# Patient Record
Sex: Female | Born: 1950 | Race: White | Hispanic: No | Marital: Married | Smoking: Never smoker
Health system: Southern US, Community
[De-identification: ages and names within clinical notes are randomized; demographics above are authoritative.]

## PROBLEM LIST (undated history)

## (undated) DIAGNOSIS — M1990 Unspecified osteoarthritis, unspecified site: Secondary | ICD-10-CM

## (undated) DIAGNOSIS — D1803 Hemangioma of intra-abdominal structures: Secondary | ICD-10-CM

## (undated) DIAGNOSIS — T7840XA Allergy, unspecified, initial encounter: Secondary | ICD-10-CM

## (undated) DIAGNOSIS — E785 Hyperlipidemia, unspecified: Secondary | ICD-10-CM

## (undated) DIAGNOSIS — Z79890 Hormone replacement therapy (postmenopausal): Secondary | ICD-10-CM

## (undated) DIAGNOSIS — K219 Gastro-esophageal reflux disease without esophagitis: Secondary | ICD-10-CM

## (undated) HISTORY — DX: Unspecified osteoarthritis, unspecified site: M19.90

## (undated) HISTORY — DX: Hyperlipidemia, unspecified: E78.5

## (undated) HISTORY — DX: Hemangioma of intra-abdominal structures: D18.03

## (undated) HISTORY — DX: Allergy, unspecified, initial encounter: T78.40XA

## (undated) HISTORY — DX: Gastro-esophageal reflux disease without esophagitis: K21.9

## (undated) HISTORY — PX: JOINT REPLACEMENT: SHX530

## (undated) HISTORY — PX: EYE SURGERY: SHX253

## (undated) HISTORY — PX: TUBAL LIGATION: SHX77

## (undated) HISTORY — DX: Hormone replacement therapy: Z79.890

---
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
## 2008-04-03 ENCOUNTER — Ambulatory Visit: Payer: Self-pay | Admitting: *Deleted

## 2008-04-03 ENCOUNTER — Ambulatory Visit (HOSPITAL_BASED_OUTPATIENT_CLINIC_OR_DEPARTMENT_OTHER): Admission: RE | Admit: 2008-04-03 | Discharge: 2008-04-03 | Payer: Self-pay | Admitting: *Deleted

## 2008-04-03 DIAGNOSIS — H9190 Unspecified hearing loss, unspecified ear: Secondary | ICD-10-CM | POA: Insufficient documentation

## 2008-04-03 DIAGNOSIS — E785 Hyperlipidemia, unspecified: Secondary | ICD-10-CM | POA: Insufficient documentation

## 2008-04-03 DIAGNOSIS — E7800 Pure hypercholesterolemia, unspecified: Secondary | ICD-10-CM | POA: Insufficient documentation

## 2008-04-03 DIAGNOSIS — R42 Dizziness and giddiness: Secondary | ICD-10-CM | POA: Insufficient documentation

## 2008-04-08 ENCOUNTER — Encounter (INDEPENDENT_AMBULATORY_CARE_PROVIDER_SITE_OTHER): Payer: Self-pay | Admitting: *Deleted

## 2008-04-11 ENCOUNTER — Encounter (INDEPENDENT_AMBULATORY_CARE_PROVIDER_SITE_OTHER): Payer: Self-pay | Admitting: *Deleted

## 2008-04-15 ENCOUNTER — Telehealth (INDEPENDENT_AMBULATORY_CARE_PROVIDER_SITE_OTHER): Payer: Self-pay | Admitting: *Deleted

## 2008-06-30 ENCOUNTER — Ambulatory Visit: Payer: Self-pay | Admitting: *Deleted

## 2008-06-30 DIAGNOSIS — M1990 Unspecified osteoarthritis, unspecified site: Secondary | ICD-10-CM | POA: Insufficient documentation

## 2008-07-04 ENCOUNTER — Encounter (INDEPENDENT_AMBULATORY_CARE_PROVIDER_SITE_OTHER): Payer: Self-pay | Admitting: *Deleted

## 2008-07-09 ENCOUNTER — Encounter (INDEPENDENT_AMBULATORY_CARE_PROVIDER_SITE_OTHER): Payer: Self-pay | Admitting: *Deleted

## 2008-07-09 ENCOUNTER — Telehealth (INDEPENDENT_AMBULATORY_CARE_PROVIDER_SITE_OTHER): Payer: Self-pay | Admitting: *Deleted

## 2008-12-30 ENCOUNTER — Ambulatory Visit: Payer: Self-pay | Admitting: Family Medicine

## 2008-12-30 LAB — CONVERTED CEMR LAB: HDL goal, serum: 40 mg/dL

## 2009-04-27 ENCOUNTER — Ambulatory Visit: Payer: Self-pay | Admitting: Radiology

## 2009-04-27 ENCOUNTER — Ambulatory Visit (HOSPITAL_BASED_OUTPATIENT_CLINIC_OR_DEPARTMENT_OTHER): Admission: RE | Admit: 2009-04-27 | Discharge: 2009-04-27 | Payer: Self-pay | Admitting: Internal Medicine

## 2009-05-04 ENCOUNTER — Ambulatory Visit: Payer: Self-pay | Admitting: Family

## 2009-05-04 LAB — CONVERTED CEMR LAB
Albumin: 4.7 g/dL (ref 3.5–5.2)
BUN: 12 mg/dL (ref 6–23)
CO2: 22 meq/L (ref 19–32)
Chloride: 105 meq/L (ref 96–112)
Eosinophils Relative: 4 % (ref 0–5)
HCT: 42 % (ref 36.0–46.0)
Hemoglobin: 13.4 g/dL (ref 12.0–15.0)
Indirect Bilirubin: 0.4 mg/dL (ref 0.0–0.9)
LDL Cholesterol: 63 mg/dL (ref 0–99)
Leukocytes, UA: NEGATIVE
Lymphocytes Relative: 26 % (ref 12–46)
Lymphs Abs: 1.3 10*3/uL (ref 0.7–4.0)
Monocytes Absolute: 0.3 10*3/uL (ref 0.1–1.0)
Monocytes Relative: 7 % (ref 3–12)
Platelets: 343 10*3/uL (ref 150–400)
Potassium: 4.1 meq/L (ref 3.5–5.3)
Protein, ur: NEGATIVE mg/dL
RBC: 4.47 M/uL (ref 3.87–5.11)
Total Protein: 6.8 g/dL (ref 6.0–8.3)
Triglycerides: 115 mg/dL (ref <150)
Urobilinogen, UA: 0.2 (ref 0.0–1.0)
VLDL: 23 mg/dL (ref 0–40)
WBC: 4.9 10*3/uL (ref 4.0–10.5)

## 2009-05-05 ENCOUNTER — Other Ambulatory Visit: Admission: RE | Admit: 2009-05-05 | Discharge: 2009-05-05 | Payer: Self-pay | Admitting: Internal Medicine

## 2009-05-05 ENCOUNTER — Ambulatory Visit: Payer: Self-pay | Admitting: Family

## 2009-05-05 DIAGNOSIS — N952 Postmenopausal atrophic vaginitis: Secondary | ICD-10-CM | POA: Insufficient documentation

## 2009-05-14 LAB — HM PAP SMEAR

## 2009-05-15 ENCOUNTER — Encounter: Payer: Self-pay | Admitting: Family

## 2009-08-05 ENCOUNTER — Ambulatory Visit: Payer: Self-pay | Admitting: Family

## 2009-08-06 ENCOUNTER — Telehealth (INDEPENDENT_AMBULATORY_CARE_PROVIDER_SITE_OTHER): Payer: Self-pay | Admitting: *Deleted

## 2009-08-06 ENCOUNTER — Ambulatory Visit: Payer: Self-pay | Admitting: Family

## 2009-08-06 LAB — CONVERTED CEMR LAB: Cholesterol: 163 mg/dL (ref 0–200)

## 2009-09-23 ENCOUNTER — Telehealth: Payer: Self-pay | Admitting: Family

## 2009-11-10 ENCOUNTER — Ambulatory Visit: Payer: Self-pay | Admitting: Family

## 2009-11-10 LAB — CONVERTED CEMR LAB
HDL: 41 mg/dL (ref >39)
LDL Cholesterol: 147 mg/dL — ABNORMAL HIGH (ref 0–99)
Triglycerides: 130 mg/dL (ref <150)
VLDL: 26 mg/dL (ref 0–40)

## 2009-11-11 ENCOUNTER — Encounter: Payer: Self-pay | Admitting: Family

## 2010-03-24 ENCOUNTER — Telehealth: Payer: Self-pay | Admitting: Family

## 2010-05-20 ENCOUNTER — Ambulatory Visit: Payer: Self-pay | Admitting: Diagnostic Radiology

## 2010-05-20 ENCOUNTER — Ambulatory Visit (HOSPITAL_BASED_OUTPATIENT_CLINIC_OR_DEPARTMENT_OTHER): Admission: RE | Admit: 2010-05-20 | Discharge: 2010-05-20 | Payer: Self-pay | Admitting: Internal Medicine

## 2010-05-20 LAB — HM MAMMOGRAPHY: HM Mammogram: NORMAL

## 2010-08-24 NOTE — Assessment & Plan Note (Signed)
Summary: 3 MONTH FOLLOW UP/MHF   Vital Signs:  Patient profile:   60 year old female Height:      63 inches Weight:      136.25 pounds BMI:     24.22 Pulse rate:   70 / minute BP sitting:   110 / 80  Vitals Entered By: Kandice Hams (August 05, 2009 11:35 AM) CC: 3  MONTH FOLLOWUP   Primary Care Provider:  Seymour Bars DO  CC:  3  MONTH FOLLOWUP.  History of Present Illness: Ms Lorge presents today in follow up.  Last visit we decreased her statin dose- with plans to repeat her lipids in 3 months.  Patient hopes to come off of her statin.  She has lost 5 pounds since our last visit- she has been following weight watchers.  She also notes that she has been using estrogen cream intermittently for vaginal dryness/ dypareunia.  She notes improvement when she uses this cream.    Patient also notes bilateral wrist pain at times.  Occasionally she develops tingling in the fingers of her left hand.    Allergies: No Known Drug Allergies  Family History: Family History of Arthritis Mother had breast cancer- age 33 Family History Diabetes 1st degree relative Family History Bladder cancer  Review of Systems       bilateral wrist pain, occasional numbness in the left   Physical Exam  General:  Well-developed,well-nourished,in no acute distress; alert,appropriate and cooperative throughout examination Neck:  No deformities, masses, or tenderness noted. Lungs:  Normal respiratory effort, chest expands symmetrically. Lungs are clear to auscultation, no crackles or wheezes. Heart:  Normal rate and regular rhythm. S1 and S2 normal without gallop, murmur, click, rub or other extra sounds. Msk:  negative phalens test   Impression & Recommendations:  Problem # 1:  HYPERCHOLESTEROLEMIA (ICD-272.0) Assessment Comment Only Will check FLP, and LFT's.  Patient dose not have any clear risk factors for heart disease.  If her cholesterol looks good, will consider trial off of statin.  Patient  has lost almost 15 pounds since she established care in 2009. I commended her efforts.   Her updated medication list for this problem includes:    Simvastatin 10 Mg Tabs (Simvastatin) .Marland Kitchen... 1 by mouth at bedtime  Problem # 2:  VAGINITIS, ATROPHIC, POSTMENOPAUSAL (ICD-627.3) Assessment: Improved Continue estrace as needed.   Her updated medication list for this problem includes:    Estrace 0.1 Mg/gm Crea (Estradiol) .Marland Kitchen... Apply twice weekly per vagina  Patient Instructions: 1)  Please return to the lab fasting for a fasting lipid panel and LFT's (ICD-9 272.0).  This may be done at [facility]. 2)  Please schedule a follow-up appointment in 6 months.

## 2010-08-24 NOTE — Letter (Signed)
   [place] [facility] [address] [place], [place]  [address]  [place] Phone: [phone]      November 11, 2009   [facility] [address] [place], [place] [address]  RE:  LAB RESULTS  Dear  Ms. Fontanella,  The following is an interpretation of your most recent lab tests.  Please take note of any instructions provided or changes to medications that have resulted from your lab work.  Health professionals look at cholesterol as more involved than just the total cholesterol. We consider the level of LDL (bad) cholesterol, HDL (good), cholesterol, and Triglycerides (Grease) in the blood.  1. Your LDL should be under 100, and the HDL should be over 45, if you have any vascular disease such as heart attack, angina, stroke, TIA (mini stroke), claudication (pain in the legs when you walk due to poor circulation),  Abdominal Aortic Aneurysm (AAA), diabetes or prediabetes.  2. Your LDL should be under 130 if you have any two of the following:     a. Smoke or chew tobacco,     b. High blood pressure (if you are on medication or over 140/90 without medication),     c. Female gender,    d. HDL below 40,    e. A female relative (father, brother, or son), who have had any vascular event          as described in #1. above under the age of 104, or a female relative (mother,       sister, or daughter) who had an event as described above under age 104. (An HDL over 60 will subtract one risk factor from the total, so if you have two items in # 2 above, but an HDL over 60, you then fall into category # 3 below).  3. Your LDL should be under 160 if you have any one of the above.  Triglycerides should be under 200 with the ideal being under 150.  For diabetes or pre-diabetes, the ideal HgbA1C should be under 6.0%.  If you fall into any of the above categories, you should make a follow up appointment to discuss this with your physician.  LIPID PANEL:  Stable - no changes needed Triglyceride:  130   Cholesterol: 214   LDL: 147   HDL: 41   Chol/HDL%:  5.2 Ratio   Your cholesterol has crept back up a bit.  It is reasonable at this point to keep a close eye on your cholesterol and continue to work hard on diet and exercise.  Lets plan to repeat in October.   Have a nice Spring and Summer!   Sincerely Yours,    Lemont Fillers FNP

## 2010-08-24 NOTE — Progress Notes (Signed)
  Phone Note Outgoing Call   Call placed by: Lemont Fillers FNP,  March 24, 2010 11:52 AM Call placed to: Patient Summary of Call: Patient's husband came to be seen today and asked for rx for wife for zostavax.  I called pt and confirmed that she wishes to receive injection- she gave me permission to give rx to her husband. Initial call taken by: Lemont Fillers FNP,  March 24, 2010 11:53 AM    New/Updated Medications: ZOSTAVAX 04540 UNT/0.65ML SOLR (ZOSTER VACCINE LIVE) call office to arrange appointment to inject vaccine Prescriptions: ZOSTAVAX 98119 UNT/0.65ML SOLR (ZOSTER VACCINE LIVE) call office to arrange appointment to inject vaccine  #1 x 0   Entered and Authorized by:   Lemont Fillers FNP   Signed by:   Lemont Fillers FNP on 03/24/2010   Method used:   Print then Give to Patient   RxID:   [id number]

## 2010-08-24 NOTE — Assessment & Plan Note (Signed)
Summary: FASTING 3 MONTH ROA AND LABS/CDJ   Vital Signs:  Patient profile:   60 year old female Height:      63 inches Weight:      132 pounds BMI:     23.47 Temp:     98.0 degrees F oral Pulse rate:   84 / minute Pulse rhythm:   regular Resp:     12 per minute BP sitting:   100 / 78  (right arm) Cuff size:   regular  Vitals Entered By: Mervin Kung CMA (November 10, 2009 8:02 AM) CC: room 4  3 month fasting follow up Is Patient Diabetic? No   Primary Care Provider:  Seymour Bars DO  CC:  room 4  3 month fasting follow up.  History of Present Illness: 60 year old female who presents today for follow up of her hypercholesterolemia.   She notes that she has lost an additional 4 pounds on weight watchers and has achieved her goal weight.   She has been off of her statin since her last visit and presents fasting today.  Atrophic Vaginitis- notes that her symptoms are stable, she is rarely using the estrace.    Patient notes that she has had a great deal of stress due to her husband's recent job loss and a death in the family.    Allergies (verified): No Known Drug Allergies  Physical Exam  General:  Well-developed,well-nourished,in no acute distress; alert,appropriate and cooperative throughout examination Head:  Normocephalic and atraumatic without obvious abnormalities. No apparent alopecia or balding. Lungs:  Normal respiratory effort, chest expands symmetrically. Lungs are clear to auscultation, no crackles or wheezes. Heart:  Normal rate and regular rhythm. S1 and S2 normal without gallop, murmur, click, rub or other extra sounds.   Impression & Recommendations:  Problem # 1:  HYPERCHOLESTEROLEMIA (ICD-272.0) Assessment Comment Only Check FLP today.  Patient has lost 16 pounds on weight watchers and is hoping that her lipids will remain stable off of a statin.   Orders: T-Lipid Profile ([id number])  Problem # 2:  VAGINITIS, ATROPHIC, POSTMENOPAUSAL  (ICD-627.3) Assessment: Improved Patient notes that her symptoms are stable, she is rarely using the estrace.   Her updated medication list for this problem includes:    Estrace 0.1 Mg/gm Crea (Estradiol) .Marland Kitchen... Apply twice weekly per vagina  Complete Medication List: 1)  Complete Tabs (Multiple vitamins-minerals) .... Take 1 tablet by mouth once a day 2)  Fish Oil Maximum Strength 1200 Mg Caps (Omega-3 fatty acids) .... Take 1 tablet by mouth once a day 3)  Bayer Aspirin Ec Low Dose 81 Mg Tbec (Aspirin) .... Take 1 tablet by mouth once a day 4)  Calcium 500/vitamin D 500-125 Mg-unit Tabs (Calcium carbonate-vitamin d) .... Take 1 tablet by mouth once a day 5)  Glucosamine Chondr 1500 Complx Caps (Glucosamine-chondroit-vit c-mn) .... Take 1 tablet by mouth once a day 6)  Estrace 0.1 Mg/gm Crea (Estradiol) .... Apply twice weekly per vagina  Patient Instructions: 1)  Good job on your weight loss! 2)  Have a great spring. 3)  Please follow up in 1 year, sooner if problems or concerns.  Current Allergies (reviewed today): No known allergies

## 2010-08-24 NOTE — Progress Notes (Signed)
----   Converted from flag ---- ---- 09/23/2009 12:30 PM, Michaelle Copas wrote: Patient has 2 appointments scheduled to see you & she needs to know which one she should actually keep and f/u with you.Her appt. dates are 4/49/11 & 01/05/10..... Which one should she cancel?? I will call patient back as soon as you let me know which one to cancel. Thank you, Michaelle Copas   ------------------------------  Patient needs to return in April for fasting lipid panel.  I do not need to see her back for a visit unless her cholesterol is abnormal.  She should follow for annual physical, sooner if problems or concerns. thanks

## 2010-08-24 NOTE — Progress Notes (Signed)
Summary: discuss labs  Phone Note Outgoing Call   Summary of Call: Please call patient and let her know that her cholesterol looks good.  She may discontinue her simvastatin, lets have her repeat a fasting lipid profile in 3 months please.   Initial call taken by: Lemont Fillers FNP,  August 06, 2009 10:21 PM  Follow-up for Phone Call        left message on machine .......Marland KitchenDoristine Devoid  August 07, 2009 4:27 PM   spoke w/ patient aware of instructions and f/u scheduled for 3 months...........Marland KitchenDoristine Devoid  August 10, 2009 3:07 PM

## 2010-08-31 ENCOUNTER — Encounter: Payer: Self-pay | Admitting: Family

## 2010-09-01 ENCOUNTER — Encounter: Payer: Self-pay | Admitting: Family

## 2010-09-09 NOTE — Miscellaneous (Signed)
Summary: Zoster Vaccine  Clinical Lists Changes  Medications: Removed medication of ZOSTAVAX 91478 UNT/0.65ML SOLR (ZOSTER VACCINE LIVE) call office to arrange appointment to inject vaccine Observations: Added new observation of ZOSTAVAX: Zostavax (08/31/2010 9:04)      Immunization History:  Zostavax History:    Zostavax # 1:  zostavax (08/31/2010)

## 2010-09-15 NOTE — Miscellaneous (Signed)
Summary: Shingles Vaccine  Shingles Vaccine   Imported By: Maryln Gottron 09/09/2010 15:31:55  _____________________________________________________________________  External Attachment:    Type:   Image     Comment:   External Document

## 2010-10-12 ENCOUNTER — Telehealth: Payer: Self-pay | Admitting: *Deleted

## 2010-10-12 DIAGNOSIS — Z0000 Encounter for general adult medical examination without abnormal findings: Secondary | ICD-10-CM

## 2010-10-12 NOTE — Telephone Encounter (Signed)
Melissa, please advise

## 2010-10-12 NOTE — Telephone Encounter (Signed)
Please send BMET, LFT, FLP, TSH, CBC (v70).

## 2010-10-13 NOTE — Telephone Encounter (Signed)
Orders entered and faxed to the lab.  

## 2010-10-18 ENCOUNTER — Encounter: Payer: Self-pay | Admitting: Family

## 2010-10-18 ENCOUNTER — Other Ambulatory Visit: Payer: Self-pay | Admitting: Family

## 2010-10-18 DIAGNOSIS — Z0000 Encounter for general adult medical examination without abnormal findings: Secondary | ICD-10-CM

## 2010-10-18 DIAGNOSIS — E7800 Pure hypercholesterolemia, unspecified: Secondary | ICD-10-CM

## 2010-10-18 LAB — HEPATIC FUNCTION PANEL
Bilirubin, Direct: 0.1 mg/dL (ref 0.0–0.3)
Indirect Bilirubin: 0.4 mg/dL (ref 0.0–0.9)
Total Protein: 7.2 g/dL (ref 6.0–8.3)

## 2010-10-18 LAB — CBC WITH DIFFERENTIAL/PLATELET
Basophils Relative: 1 % (ref 0–1)
Eosinophils Absolute: 0.2 10*3/uL (ref 0.0–0.7)
Eosinophils Relative: 3 % (ref 0–5)
Hemoglobin: 13.5 g/dL (ref 12.0–15.0)
MCH: 30.4 pg (ref 26.0–34.0)
MCHC: 32.4 g/dL (ref 30.0–36.0)
MCV: 93.9 fL (ref 78.0–100.0)
Monocytes Relative: 6 % (ref 3–12)
Neutrophils Relative %: 66 % (ref 43–77)

## 2010-10-18 LAB — BASIC METABOLIC PANEL
Calcium: 9.5 mg/dL (ref 8.4–10.5)
Creat: 0.7 mg/dL (ref 0.40–1.20)

## 2010-10-18 LAB — LIPID PANEL
HDL: 44 mg/dL (ref >39)
LDL Cholesterol: 138 mg/dL — ABNORMAL HIGH (ref 0–99)
Triglycerides: 188 mg/dL — ABNORMAL HIGH (ref <150)
VLDL: 38 mg/dL (ref 0–40)

## 2010-10-18 NOTE — Progress Notes (Signed)
This encounter was created in error - please disregard.

## 2010-10-22 ENCOUNTER — Encounter: Payer: Self-pay | Admitting: Internal Medicine

## 2010-10-25 ENCOUNTER — Ambulatory Visit (INDEPENDENT_AMBULATORY_CARE_PROVIDER_SITE_OTHER): Payer: BC Managed Care – PPO | Admitting: Family

## 2010-10-25 ENCOUNTER — Encounter: Payer: Self-pay | Admitting: Family

## 2010-10-25 DIAGNOSIS — N952 Postmenopausal atrophic vaginitis: Secondary | ICD-10-CM

## 2010-10-25 DIAGNOSIS — G5600 Carpal tunnel syndrome, unspecified upper limb: Secondary | ICD-10-CM

## 2010-10-25 DIAGNOSIS — M545 Low back pain, unspecified: Secondary | ICD-10-CM

## 2010-10-25 DIAGNOSIS — G5601 Carpal tunnel syndrome, right upper limb: Secondary | ICD-10-CM

## 2010-10-25 DIAGNOSIS — Z0000 Encounter for general adult medical examination without abnormal findings: Secondary | ICD-10-CM

## 2010-10-25 MED ORDER — ESTRADIOL 0.1 MG/GM VA CREA: TOPICAL_CREAM | VAGINAL | Status: DC

## 2010-10-25 NOTE — Assessment & Plan Note (Signed)
Labs reviewed, including hyperlipidemia/hypertriglyceridemia.  Discussed importance of low cholesterol diet and avoiding high fructose corn syrup, concentrated sweets- substitute whole grained pasta/rice/bread for white versions.  Immunizations reviewed and up to date.  Due for colonoscopy, dexa.  Will order.

## 2010-10-25 NOTE — Assessment & Plan Note (Signed)
Pt's intermittent pain/right leg "giving out" sounds like it could be related to her lumbar spine.  She wishes to defer further imaging at this time and have a trial of NSAIDS.  No weakness or numbness in the leg at this time.  Monitor.

## 2010-10-25 NOTE — Assessment & Plan Note (Signed)
Recommended that she purchase a wrist brace for the right hand to be worn at night and hort term use of Motrin or Aleve.  Recommended that she work on good posture during her crafting such as crocheting to help alleviate neck stress/pain.  If no improvement with these measures, I recommended that she let us know and we can consider further imaging.

## 2010-10-25 NOTE — Patient Instructions (Addendum)
Keep up the good work with the exercise. Please schedule a mammogram on the first floor today. You may use aleve as needed for neck and back pain.  Let me know if your symptoms worsen or if they do not improve in the next 1 month. You will be contacted about your referral to Gastroenterology for your colonoscopy. Please schedule your bone density at the front desk.

## 2010-10-25 NOTE — Assessment & Plan Note (Signed)
Patient is requesting to resume vaginal Estrace.  Will resume at low dose.

## 2010-10-25 NOTE — Progress Notes (Signed)
  Subjective:    Patient ID: Jocelyn Morgan, female    DOB: 01/01/1951, 60 y.o.   MRN: [id number]  HPI  Ms.  Morgan is a 60 year old female who presents today for her annual physical.  She also has some additional concerns.  1) Preventative- Last Pap was 10/10 and was up to date.  Last colonoscopy was in 2001. Reports that her last bone density test was about 7 yrs ago.  She is exercising regularly.  Eating healthy- she has managed to keep the weight off that she lost last year with weight watchers. Had Zostavax given and Tetanus is up to date.     2) Neck pain- top of neck with sleep Some numbness in the righ hand.    3) R groin pain- occasionally right leg- gives up on me.  Some tenderness in lower back.  Stability loss.  Worse when walking down stair.  Denies masses or abdominal bulging.  4) Had abdominal pain 3-4 weeks ago with associated low grade fever.  Resolved.      Review of Systems  Constitutional: Negative for fatigue.  HENT: Positive for neck stiffness. Negative for hearing loss.   Eyes: Negative for visual disturbance.  Respiratory: Negative for cough and shortness of breath.   Cardiovascular: Negative for chest pain and leg swelling.  Gastrointestinal: Negative for abdominal pain and abdominal distention.  Genitourinary: Negative for dysuria and frequency.  Musculoskeletal: Positive for back pain. Negative for joint swelling.  Skin: Positive for rash.  Neurological: Negative for weakness and headaches.  Hematological: Negative for adenopathy.  Psychiatric/Behavioral:       Denies depression/anxiety       Objective:   Physical Exam  Constitutional: She is oriented to person, place, and time. She appears well-developed and well-nourished.  HENT:  Head: Normocephalic and atraumatic.  Eyes: Conjunctivae are normal. Pupils are equal, round, and reactive to light.  Neck: Neck supple. No thyromegaly present.  Cardiovascular: Normal rate, regular rhythm, S1 normal and  S2 normal.   Pulmonary/Chest: Effort normal and breath sounds normal.  Abdominal: Soft. Bowel sounds are normal. She exhibits no mass. There is no tenderness.  Genitourinary: No breast swelling, tenderness or discharge.       Deferred- pap is up to date.  Musculoskeletal: Normal range of motion. She exhibits no edema and no tenderness.       Right shoulder: She exhibits no swelling.  Lymphadenopathy:    She has no cervical adenopathy.    She has no axillary adenopathy.  Neurological: She is alert and oriented to person, place, and time. She exhibits normal muscle tone.       Bilateral upper extremity strength is 5/5, bilateral lower extremity strength is 5/5  Neg Phalans test, neg tinnels   Skin: Skin is warm and dry. No rash noted.  Psychiatric: She has a normal mood and affect. Her speech is normal and behavior is normal.          Assessment & Plan:

## 2010-11-04 ENCOUNTER — Ambulatory Visit (INDEPENDENT_AMBULATORY_CARE_PROVIDER_SITE_OTHER)
Admission: RE | Admit: 2010-11-04 | Discharge: 2010-11-04 | Disposition: A | Discharge status: Home / Self Care | Payer: BC Managed Care – PPO | Source: Ambulatory Visit

## 2010-11-04 DIAGNOSIS — N959 Unspecified menopausal and perimenopausal disorder: Secondary | ICD-10-CM

## 2010-11-04 DIAGNOSIS — Z13820 Encounter for screening for osteoporosis: Secondary | ICD-10-CM

## 2010-11-17 ENCOUNTER — Telehealth: Payer: Self-pay | Admitting: Family

## 2010-11-17 DIAGNOSIS — M8580 Other specified disorders of bone density and structure, unspecified site: Secondary | ICD-10-CM | POA: Insufficient documentation

## 2010-11-17 DIAGNOSIS — M810 Age-related osteoporosis without current pathological fracture: Secondary | ICD-10-CM | POA: Insufficient documentation

## 2010-11-17 NOTE — Telephone Encounter (Signed)
Please call patient and let her know that I have reviewed her bone density test.  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  She should continue her calcium +D supplement and exercise.  I would also like to have her complete a vitamin D level please (diag osteopenia).  Will will plan to repeat her bone density in 2 yrs.

## 2010-11-18 NOTE — Telephone Encounter (Signed)
Left message on machine to return my call. 

## 2010-11-19 NOTE — Telephone Encounter (Signed)
Pt notified and requests copies of bone density and most recent labs. Info mailed to pt. Lab order has been entered and faxed to the lab. Pt was provided with lab hours.

## 2010-11-19 NOTE — Telephone Encounter (Signed)
Bone density not scanned into Epic yet. Will have to call Med records to see if they can scan it.

## 2010-11-22 NOTE — Telephone Encounter (Signed)
Spoke to Trish at [facility]. She does not have bone density report yet. She will check in today's envelope and let me know if it came through. Advised pt report will have to be scanned before I can mail it to her.

## 2010-11-24 ENCOUNTER — Encounter: Payer: Self-pay | Admitting: Family

## 2010-11-25 NOTE — Telephone Encounter (Signed)
Spoke to pt and advised her that we have received bone density copy and it has been mailed to her along with her lab results. Pt asked Vit D result and was notified that it was in the normal range. Please advise if pt should continue current medication regimen or if she needs Rx strength Vit d.

## 2010-11-25 NOTE — Telephone Encounter (Signed)
Spoke with Trish @ [facility] @ [facility] and she states she still has not received pt's bone density result for scanning. Spoke to Larae at [facility] [phone] and she will look for report and call me back.

## 2010-12-08 ENCOUNTER — Encounter: Payer: Self-pay | Admitting: Family

## 2010-12-08 LAB — HM COLONOSCOPY: HM COLON: NORMAL

## 2011-03-14 ENCOUNTER — Emergency Department (HOSPITAL_BASED_OUTPATIENT_CLINIC_OR_DEPARTMENT_OTHER)
Admission: EM | Admit: 2011-03-14 | Discharge: 2011-03-14 | Disposition: A | Discharge status: Home / Self Care | Payer: BC Managed Care – PPO | Attending: Emergency Medicine | Admitting: Emergency Medicine

## 2011-03-14 ENCOUNTER — Encounter (HOSPITAL_BASED_OUTPATIENT_CLINIC_OR_DEPARTMENT_OTHER): Payer: Self-pay | Admitting: *Deleted

## 2011-03-14 ENCOUNTER — Emergency Department (INDEPENDENT_AMBULATORY_CARE_PROVIDER_SITE_OTHER): Payer: BC Managed Care – PPO

## 2011-03-14 DIAGNOSIS — K5732 Diverticulitis of large intestine without perforation or abscess without bleeding: Secondary | ICD-10-CM

## 2011-03-14 DIAGNOSIS — K625 Hemorrhage of anus and rectum: Secondary | ICD-10-CM | POA: Insufficient documentation

## 2011-03-14 DIAGNOSIS — Z8739 Personal history of other diseases of the musculoskeletal system and connective tissue: Secondary | ICD-10-CM | POA: Insufficient documentation

## 2011-03-14 DIAGNOSIS — Z79899 Other long term (current) drug therapy: Secondary | ICD-10-CM | POA: Insufficient documentation

## 2011-03-14 DIAGNOSIS — E785 Hyperlipidemia, unspecified: Secondary | ICD-10-CM | POA: Insufficient documentation

## 2011-03-14 DIAGNOSIS — M4120 Other idiopathic scoliosis, site unspecified: Secondary | ICD-10-CM

## 2011-03-14 DIAGNOSIS — K769 Liver disease, unspecified: Secondary | ICD-10-CM

## 2011-03-14 DIAGNOSIS — R1032 Left lower quadrant pain: Secondary | ICD-10-CM | POA: Insufficient documentation

## 2011-03-14 DIAGNOSIS — K7689 Other specified diseases of liver: Secondary | ICD-10-CM

## 2011-03-14 LAB — CBC
HCT: 39.9 % (ref 36.0–46.0)
Hemoglobin: 13.6 g/dL (ref 12.0–15.0)
MCV: 90.9 fL (ref 78.0–100.0)
RBC: 4.39 MIL/uL (ref 3.87–5.11)
RDW: 12.3 % (ref 11.5–15.5)
WBC: 14.6 10*3/uL — ABNORMAL HIGH (ref 4.0–10.5)

## 2011-03-14 LAB — URINALYSIS, ROUTINE W REFLEX MICROSCOPIC
Glucose, UA: NEGATIVE mg/dL
Leukocytes, UA: NEGATIVE
Protein, ur: NEGATIVE mg/dL

## 2011-03-14 LAB — COMPREHENSIVE METABOLIC PANEL
Albumin: 4.7 g/dL (ref 3.5–5.2)
BUN: 13 mg/dL (ref 6–23)
Calcium: 10.4 mg/dL (ref 8.4–10.5)
Chloride: 100 mEq/L (ref 96–112)
Creatinine, Ser: 0.7 mg/dL (ref 0.50–1.10)
Total Bilirubin: 0.5 mg/dL (ref 0.3–1.2)
Total Protein: 7.9 g/dL (ref 6.0–8.3)

## 2011-03-14 LAB — URINE MICROSCOPIC-ADD ON

## 2011-03-14 LAB — DIFFERENTIAL
Basophils Relative: 0 % (ref 0–1)
Eosinophils Absolute: 0.1 10*3/uL (ref 0.0–0.7)
Eosinophils Relative: 1 % (ref 0–5)
Monocytes Absolute: 0.9 10*3/uL (ref 0.1–1.0)
Monocytes Relative: 6 % (ref 3–12)

## 2011-03-14 MED ORDER — CIPROFLOXACIN HCL 500 MG PO TABS
500.0000 mg | ORAL_TABLET | Freq: Once | ORAL | Status: AC
  Administered 2011-03-14: 500 mg via ORAL
  Filled 2011-03-14: qty 1

## 2011-03-14 MED ORDER — METRONIDAZOLE 500 MG PO TABS
500.0000 mg | ORAL_TABLET | Freq: Once | ORAL | Status: AC
  Administered 2011-03-14: 500 mg via ORAL
  Filled 2011-03-14: qty 1

## 2011-03-14 MED ORDER — IOHEXOL 300 MG/ML  SOLN
100.0000 mL | Freq: Once | INTRAMUSCULAR | Status: AC | PRN
  Administered 2011-03-14: 100 mL via INTRAVENOUS

## 2011-03-14 MED ORDER — HYDROCODONE-ACETAMINOPHEN 5-500 MG PO TABS: 1.0000 | ORAL_TABLET | Freq: Four times a day (QID) | ORAL | Status: DC | PRN

## 2011-03-14 MED ORDER — HYDROCODONE-ACETAMINOPHEN 5-325 MG PO TABS
1.0000 | ORAL_TABLET | Freq: Once | ORAL | Status: AC
  Administered 2011-03-14: 1 via ORAL
  Filled 2011-03-14: qty 1

## 2011-03-14 MED ORDER — METRONIDAZOLE 500 MG PO TABS: 500.0000 mg | ORAL_TABLET | Freq: Three times a day (TID) | ORAL | Status: AC

## 2011-03-14 MED ORDER — CIPROFLOXACIN HCL 500 MG PO TABS: 500.0000 mg | ORAL_TABLET | Freq: Two times a day (BID) | ORAL | Status: AC

## 2011-03-14 NOTE — ED Notes (Signed)
Pt states that she has been having lower abd pain since Friday. C/O low-grade temp that started this AM and a few episodes of blood in her stools. Denies weakness or vaginal bleeding.

## 2011-03-14 NOTE — ED Notes (Signed)
Pt to CT, IV site unremarkable.

## 2011-03-14 NOTE — ED Provider Notes (Signed)
History    Scribed for Geoffery Lyons, MD, the patient was seen in room MH07/MH07. This chart was scribed by Katha Cabal. This patient's care was started at 8:24PM.   CSN: [id number] Arrival date & time: 03/14/2011  7:56 PM  Chief Complaint  Patient presents with  . Abdominal Pain  . Rectal Bleeding   HPI Vallorie Niccoli is a 60 y.o. female who presents to the Emergency Department complaining of intermittent lower abdominal pain onset 3 days ago with associated rectal bleeding onset 1 day ago, diarrhea, and fever.   Denies vomiting, C section, and cholecystectomy.  Pt notes that her recent (May/June) colonoscopy revealed diverticulosis and having a few small polps removed.     HPI ELEMENTS:  Location: lower abdominal  Onset: 3 days ago  Duration: persistent since onset   Timing: intermittent  Severity: moderate  Context: as above  Associated symptoms: rectal bleeding onset 1 day ago, diarrhea, and fever.  Denies vomiting, C section, and cholecystectomy.    PAST MEDICAL HISTORY:  Past Medical History  Diagnosis Date  . Hyperlipidemia   . Arthritis     osteoarthritis  . Postmenopausal HRT (hormone replacement therapy)     PAST SURGICAL HISTORY:  Past Surgical History  Procedure Date  . Tubal ligation   . Tubal ligation     MEDICATIONS:  Previous Medications   ASPIRIN 81 MG EC TABLET    Take 81 mg by mouth daily.     CALCIUM-VITAMIN D 500-125 MG-UNIT TABS    Take 1 tablet by mouth daily.     ESTRADIOL (ESTRACE) 0.1 MG/GM VAGINAL CREAM    One gram of cream inserted into vagina twice weekly.   IBUPROFEN (ADVIL,MOTRIN) 200 MG TABLET    Take 400 mg by mouth every 6 (six) hours as needed. pain    MISC NATURAL PRODUCTS (OSTEO BI-FLEX ADV TRIPLE ST) TABS    Take 1 tablet by mouth daily.     MULTIPLE VITAMIN (MULTIVITAMIN) TABLET    Take 1 tablet by mouth daily.     OMEGA-3 FATTY ACIDS (FISH OIL) 1200 MG CAPS    Take 1 capsule by mouth daily.       ALLERGIES:  Allergies as of  03/14/2011  . (No Known Allergies)     FAMILY HISTORY:  Family History  Problem Relation Age of Onset  . Cancer Mother 35    breast  . Arthritis Other   . Diabetes Other   . Cancer Other     bladder     SOCIAL HISTORY: History   Social History  . Marital Status: Married    Spouse Name: N/A    Number of Children: 4  . Years of Education: N/A   Occupational History  . housewife    Social History Main Topics  . Smoking status: Never Smoker   . Smokeless tobacco: Never Used  . Alcohol Use: 0.6 oz/week    1 Glasses of wine per week  . Drug Use: No  . Sexually Active: Yes   Other Topics Concern  . None   Social History Narrative   Exercise: sometimesCaffeine: 2 cups 1/2 and 1/2 coffee daily    Review of Systems 10 Systems reviewed and are negative for acute change except as noted in the HPI.  Physical Exam  BP 120/68  Pulse 91  Temp(Src) 99.6 F (37.6 C) (Oral)  Resp 16  SpO2 97%  LMP 07/25/1998  Physical Exam  Nursing note and vitals reviewed. Constitutional: She is oriented  to person, place, and time. She appears well-developed and well-nourished.  HENT:  Head: Normocephalic and atraumatic.  Neck: Normal range of motion. Neck supple.  Cardiovascular: Normal rate, regular rhythm and normal heart sounds.  Exam reveals no gallop and no friction rub.   No murmur heard. Pulmonary/Chest: Effort normal and breath sounds normal. No respiratory distress.       Lungs clear bilaterally  Abdominal: Soft. Bowel sounds are normal. There is tenderness (suprapubic tenderness). There is no rebound, no guarding and no CVA tenderness.  Musculoskeletal: Normal range of motion.  Neurological: She is alert and oriented to person, place, and time.  Skin: Skin is warm and dry.  Psychiatric: She has a normal mood and affect. Her behavior is normal.   I personally performed the services described in this documentation, which was scribed in my presence. The recorded information  has been reviewed and considered. No att. providers found   ED Course  Procedures OTHER DATA REVIEWED: Nursing notes, vital signs, and past medical records reviewed.   DIAGNOSTIC STUDIES: Oxygen Saturation is 96% on room air, normal by my interpretation.      LABS / RADIOLOGY:  Results for orders placed during the hospital encounter of 03/14/11  CBC      Component Value Range   WBC 14.6 (*) 4.0 - 10.5 (K/uL)   RBC 4.39  3.87 - 5.11 (MIL/uL)   Hemoglobin 13.6  12.0 - 15.0 (g/dL)   HCT 40.9  81.1 - 91.4 (%)   MCV 90.9  78.0 - 100.0 (fL)   MCH 31.0  26.0 - 34.0 (pg)   MCHC 34.1  30.0 - 36.0 (g/dL)   RDW 78.2  95.6 - 21.3 (%)   Platelets 305  150 - 400 (K/uL)  DIFFERENTIAL      Component Value Range   Neutrophils Relative 85 (*) 43 - 77 (%)   Neutro Abs 12.4 (*) 1.7 - 7.7 (K/uL)   Lymphocytes Relative 9 (*) 12 - 46 (%)   Lymphs Abs 1.3  0.7 - 4.0 (K/uL)   Monocytes Relative 6  3 - 12 (%)   Monocytes Absolute 0.9  0.1 - 1.0 (K/uL)   Eosinophils Relative 1  0 - 5 (%)   Eosinophils Absolute 0.1  0.0 - 0.7 (K/uL)   Basophils Relative 0  0 - 1 (%)   Basophils Absolute 0.0  0.0 - 0.1 (K/uL)  COMPREHENSIVE METABOLIC PANEL      Component Value Range   Sodium 138  135 - 145 (mEq/L)   Potassium 3.6  3.5 - 5.1 (mEq/L)   Chloride 100  96 - 112 (mEq/L)   CO2 25  19 - 32 (mEq/L)   Glucose, Bld 122 (*) 70 - 99 (mg/dL)   BUN 13  6 - 23 (mg/dL)   Creatinine, Ser 0.86  0.50 - 1.10 (mg/dL)   Calcium 57.8  8.4 - 10.5 (mg/dL)   Total Protein 7.9  6.0 - 8.3 (g/dL)   Albumin 4.7  3.5 - 5.2 (g/dL)   AST 19  0 - 37 (U/L)   ALT 14  0 - 35 (U/L)   Alkaline Phosphatase 61  39 - 117 (U/L)   Total Bilirubin 0.5  0.3 - 1.2 (mg/dL)   GFR calc non Af Amer >60  >60 (mL/min)   GFR calc Af Amer >60  >60 (mL/min)  URINALYSIS, ROUTINE W REFLEX MICROSCOPIC      Component Value Range   Color, Urine YELLOW  YELLOW    Appearance  CLEAR  CLEAR    Specific Gravity, Urine 1.006  1.005 - 1.030    pH 6.0   5.0 - 8.0    Glucose, UA NEGATIVE  NEGATIVE (mg/dL)   Hgb urine dipstick SMALL (*) NEGATIVE    Bilirubin Urine NEGATIVE  NEGATIVE    Ketones, ur NEGATIVE  NEGATIVE (mg/dL)   Protein, ur NEGATIVE  NEGATIVE (mg/dL)   Urobilinogen, UA 0.2  0.0 - 1.0 (mg/dL)   Nitrite NEGATIVE  NEGATIVE    Leukocytes, UA NEGATIVE  NEGATIVE   URINE MICROSCOPIC-ADD ON      Component Value Range   Squamous Epithelial / LPF RARE  RARE    RBC / HPF 3-6  <3 (RBC/hpf)   Bacteria, UA RARE  RARE    No results found.  Ct Abdomen Pelvis W Contrast  03/14/2011  *RADIOLOGY REPORT*  Clinical Data: Lower abdominal pain and rectal bleeding; low grade fever.  CT ABDOMEN AND PELVIS WITH CONTRAST  Technique:  Multidetector CT imaging of the abdomen and pelvis was performed following the standard protocol during bolus administration of intravenous contrast.  Contrast: 100 mL of Omnipaque 300 IV contrast  Comparison: None.  Findings: Minimal scarring or atelectasis is noted at the lung bases.  There is a heterogeneous mass measuring 4.2 x 3.2 x 3.1 cm within the right hepatic dome, with irregular margins; malignancy cannot be excluded.  Dynamic liver protocol MRI is recommended for further evaluation, when and as deemed clinically appropriate.  Additional scattered hypodensities throughout both hepatic lobes appear to reflect cysts.  The spleen is unremarkable in appearance.  The gallbladder is within normal limits.  The pancreas and adrenal glands are unremarkable.  There is unusual dilatation of the right ureter along its entire course, asymmetric to the left side.  This is nonspecific but raises question for distal obstruction; no definite mass is characterized.  No significant hydronephrosis is seen.  The left kidney is unremarkable in appearance.  No renal or ureteral stones are identified.  The small bowel is unremarkable in appearance.  The stomach is within normal limits.  No acute vascular abnormalities are seen.  The appendix is  normal in caliber and contains air, without evidence for appendicitis; lungs directly adjacent to the cecum. There is significant focal wall thickening along the proximal sigmoid colon, with several inflamed diverticula and surrounding soft tissue stranding, compatible with acute diverticulitis. Additional underlying mild diverticulosis is noted at the splenic flexure of the colon.  No free fluid is seen within the pelvis.  The bladder is mildly distended and grossly unremarkable in appearance.  The uterus is displaced to the right by the inflamed sigmoid colon, but otherwise appears unremarkable.  Soft tissue stranding about the sigmoid colon abuts the left ovary; the ovaries remain relatively symmetric.  No suspicious adnexal masses are seen.  A small left inguinal hernia is incidentally noted.  No inguinal lymphadenopathy is seen.  No acute osseous abnormalities are identified.  Mild left convex lumbar scoliosis is noted.  IMPRESSION:  1.  Acute diverticulitis at the proximal sigmoid colon, with significant focal colonic wall thickening and several inflamed diverticula.  Surrounding soft tissue stranding, without free fluid.  No evidence for perforation or abscess formation. Underlying diverticulosis involves the sigmoid colon and splenic flexure of the colon. 2.  Heterogeneous 4.2 cm mass within the right hepatic lobe, with irregular margins; malignancy cannot be excluded.  Dynamic liver protocol MRI is recommended for further evaluation, when and as deemed clinically appropriate.  3.  Scattered hepatic cysts noted. 4.  Dilatation of the right ureter along its entire course; this is nonspecific, though distal obstruction cannot be excluded.  No definite mass or distal stone seen; no evidence of hydronephrosis. 5.  Mild left convex lumbar scoliosis noted.  Original Report Authenticated By: Tonia Ghent, M.D.     ED COURSE / COORDINATION OF CARE: Had CT, labs consistent with diverticulitis.  Orders Placed  This Encounter  Procedures  . CT Abdomen Pelvis W Contrast  . CBC  . Differential  . Comprehensive metabolic panel  . Urinalysis with microscopic  . Urine microscopic-add on    MDM: The CT scan showed an incidental liver mass.  Radiology recommends a follow up mri to better delineate what this represents.  I discussed this at length with the patient and her husband.  They have assured me that they will follow up with her doctor to discuss this this week.     IMPRESSION: Diagnoses that have been ruled out:  Diagnoses that are still under consideration:  Final diagnoses:  Abdominal pain, left lower quadrant  Diverticulitis of colon (without mention of hemorrhage)    PLAN:  Home Nonsteroidals and Narcotic pain medication and antibiotics. The patient is to return the emergency department if there is any worsening of symptoms. I have reviewed the discharge instructions with the patient and husband.   CONDITION ON DISCHARGE: Stable   MEDICATIONS GIVEN IN THE E.D.  Medications  ibuprofen (ADVIL,MOTRIN) 200 MG tablet (not administered)  ciprofloxacin (CIPRO) 500 MG tablet (not administered)  metroNIDAZOLE (FLAGYL) 500 MG tablet (not administered)  HYDROcodone-acetaminophen (VICODIN) 5-500 MG per tablet (not administered)  iohexol (OMNIPAQUE) 300 MG/ML injection 100 mL (100 mL Intravenous Contrast Given 03/14/11 2133)     DISCHARGE MEDICATIONS: New Prescriptions   CIPROFLOXACIN (CIPRO) 500 MG TABLET    Take 1 tablet (500 mg total) by mouth 2 (two) times daily.   HYDROCODONE-ACETAMINOPHEN (VICODIN) 5-500 MG PER TABLET    Take 1-2 tablets by mouth every 6 (six) hours as needed for pain.   METRONIDAZOLE (FLAGYL) 500 MG TABLET    Take 1 tablet (500 mg total) by mouth 3 (three) times daily.       Geoffery Lyons, MD 03/14/11 [phone]

## 2011-03-14 NOTE — ED Notes (Signed)
Pt returned from CT, NAD noted.

## 2011-03-14 NOTE — ED Notes (Signed)
Pt finished CT contrast, CT made aware.

## 2011-03-14 NOTE — ED Notes (Signed)
Pt c/o generalized abd pain x 3 days, rectal bleeding x 1 day

## 2011-03-15 ENCOUNTER — Telehealth: Payer: Self-pay | Admitting: Family

## 2011-03-15 ENCOUNTER — Ambulatory Visit (INDEPENDENT_AMBULATORY_CARE_PROVIDER_SITE_OTHER): Payer: BC Managed Care – PPO | Admitting: Family

## 2011-03-15 ENCOUNTER — Encounter: Payer: Self-pay | Admitting: Family

## 2011-03-15 VITALS — BP 115/79 | HR 60 | Temp 98.7°F | Resp 16 | Ht 62.0 in | Wt 134.1 lb

## 2011-03-15 DIAGNOSIS — R160 Hepatomegaly, not elsewhere classified: Secondary | ICD-10-CM

## 2011-03-15 DIAGNOSIS — K769 Liver disease, unspecified: Secondary | ICD-10-CM

## 2011-03-15 DIAGNOSIS — K5792 Diverticulitis of intestine, part unspecified, without perforation or abscess without bleeding: Secondary | ICD-10-CM | POA: Insufficient documentation

## 2011-03-15 DIAGNOSIS — K5732 Diverticulitis of large intestine without perforation or abscess without bleeding: Secondary | ICD-10-CM

## 2011-03-15 LAB — CBC WITH DIFFERENTIAL/PLATELET
Basophils Absolute: 0 10*3/uL (ref 0.0–0.1)
Basophils Relative: 0 % (ref 0–1)
Eosinophils Absolute: 0 10*3/uL (ref 0.0–0.7)
Eosinophils Relative: 0 % (ref 0–5)
MCH: 31 pg (ref 26.0–34.0)
MCV: 92.1 fL (ref 78.0–100.0)
Platelets: 334 10*3/uL (ref 150–400)
RDW: 12.8 % (ref 11.5–15.5)
WBC: 13.4 10*3/uL — ABNORMAL HIGH (ref 4.0–10.5)

## 2011-03-15 NOTE — Assessment & Plan Note (Addendum)
She is clinically responding to antibiotics.  Notes only blood tinged mucous which I assured her may continue for the next few days.  I did caution her to contact us or go to the ED if she develops worsening abdominal pain or increasing amounts of blood in the stool.  Will check CBC today. Pt instructed to continue cipro/flagyl.

## 2011-03-15 NOTE — Patient Instructions (Signed)
Continue your antibiotics. Call if you develop worsening blood in stool, worsening abdominal pain or fever over 101.   Follow up in 1 week.

## 2011-03-15 NOTE — Progress Notes (Signed)
Subjective:    Patient ID: Jocelyn Morgan, female    DOB: [birth year], 60 y.o.   MRN: [id number]  HPI  [name] is a 60 yr old female who presents today for ED follow up.  She was seen last night in the ED for abdominal pain.  Symptoms started approximately 3 days ago. A CT abdomen and pelvis was performed in the ED which noted Diverticulitis.  She was rx'd Cipro/Flagyl.  Incidental finding of a Heterogeneous 4.2 cm mass within the right hepatic lobe, with irregular margins.    She has been drinking and eating.  Denies associated fever.  She reports that yesterday her lower abdomen was tender.  Today improved and localized mostly to the left lower abdomen. Notes associated streaks of blood mucous today.   She reports yeterday stools were loose but now more solid.  Denies previous history of diverticultis, but was told on her recent colonoscopy that she has diverticulosis.  Review of Systems See HPI  Past Medical History  Diagnosis Date  . Hyperlipidemia   . Arthritis     osteoarthritis  . Postmenopausal HRT (hormone replacement therapy)     History   Social History  . Marital Status: Married    Spouse Name: N/A    Number of Children: 4  . Years of Education: N/A   Occupational History  . housewife    Social History Main Topics  . Smoking status: Never Smoker   . Smokeless tobacco: Never Used  . Alcohol Use: 0.6 oz/week    1 Glasses of wine per week  . Drug Use: No  . Sexually Active: Yes   Other Topics Concern  . Not on file   Social History Narrative   Exercise: sometimesCaffeine: 2 cups 1/2 and 1/2 coffee daily    Past Surgical History  Procedure Date  . Tubal ligation   . Tubal ligation     Family History  Problem Relation Age of Onset  . Cancer Mother 47    breast  . Arthritis Other   . Diabetes Other   . Cancer Other     bladder    No Known Allergies  Current Outpatient Prescriptions on File Prior to Visit  Medication Sig Dispense Refill  .  aspirin 81 MG EC tablet Take 81 mg by mouth daily.        . Calcium-Vitamin D 500-125 MG-UNIT TABS Take 1 tablet by mouth daily.        . ciprofloxacin (CIPRO) 500 MG tablet Take 1 tablet (500 mg total) by mouth 2 (two) times daily.  20 tablet  0  . estradiol (ESTRACE) 0.1 MG/GM vaginal cream One gram of cream inserted into vagina twice weekly.  42.5 g  11  . HYDROcodone-acetaminophen (VICODIN) 5-500 MG per tablet Take 1-2 tablets by mouth every 6 (six) hours as needed for pain.  20 tablet  0  . ibuprofen (ADVIL,MOTRIN) 200 MG tablet Take 400 mg by mouth every 6 (six) hours as needed. pain       . metroNIDAZOLE (FLAGYL) 500 MG tablet Take 1 tablet (500 mg total) by mouth 3 (three) times daily.  30 tablet  0  . Misc Natural Products (OSTEO BI-FLEX ADV TRIPLE ST) TABS Take 1 tablet by mouth daily.        . Multiple Vitamin (MULTIVITAMIN) tablet Take 1 tablet by mouth daily.        . Omega-3 Fatty Acids (FISH OIL) 1200 MG CAPS Take 1 capsule by mouth daily.  Current Facility-Administered Medications on File Prior to Visit  Medication Dose Route Frequency Provider Last Rate Last Dose  . ciprofloxacin (CIPRO) tablet 500 mg  500 mg Oral Once Geoffery Lyons, MD   500 mg at 03/14/11 2239  . HYDROcodone-acetaminophen (NORCO) 5-325 MG per tablet 1 tablet  1 tablet Oral Once Geoffery Lyons, MD   1 tablet at 03/14/11 2240  . iohexol (OMNIPAQUE) 300 MG/ML injection 100 mL  100 mL Intravenous Once PRN Medication Radiologist   100 mL at 03/14/11 2133  . metroNIDAZOLE (FLAGYL) tablet 500 mg  500 mg Oral Once Geoffery Lyons, MD   500 mg at 03/14/11 2239    BP 115/79  Pulse 60  Temp(Src) 98.7 F (37.1 C) (Oral)  Resp 16  Ht 5\' 2"  (1.575 m)  Wt 134 lb 1.3 oz (60.818 kg)  BMI 24.52 kg/m2  LMP 07/25/1998       Objective:   Physical Exam  Constitutional: She appears well-developed and well-nourished.  Cardiovascular: Normal rate and regular rhythm.   Pulmonary/Chest: Effort normal and breath sounds  normal. No respiratory distress. She has no wheezes. She has no rales. She exhibits no tenderness.  Abdominal: Soft. Bowel sounds are normal. She exhibits no distension and no mass. There is tenderness. There is no rebound and no guarding.       Mild left lower quadrant tenderness  Skin: Skin is warm and dry.  Psychiatric: She has a normal mood and affect. Her speech is normal and behavior is normal. Judgment and thought content normal. Cognition and memory are normal.          Assessment & Plan:

## 2011-03-15 NOTE — Assessment & Plan Note (Signed)
Incidental note was made on CT of liver mass.  Will order MRI to further evaluate.

## 2011-03-15 NOTE — Telephone Encounter (Signed)
Please call pt to arrange an ED follow up.

## 2011-03-15 NOTE — Telephone Encounter (Signed)
Pt was seen at 11:15 this morning.

## 2011-03-16 ENCOUNTER — Telehealth: Payer: Self-pay | Admitting: Family

## 2011-03-16 ENCOUNTER — Encounter: Payer: Self-pay | Admitting: Family

## 2011-03-16 ENCOUNTER — Ambulatory Visit (HOSPITAL_BASED_OUTPATIENT_CLINIC_OR_DEPARTMENT_OTHER)
Admission: RE | Admit: 2011-03-16 | Discharge: 2011-03-16 | Disposition: A | Discharge status: Home / Self Care | Payer: BC Managed Care – PPO | Source: Ambulatory Visit | Attending: Family | Admitting: Family

## 2011-03-16 DIAGNOSIS — K7689 Other specified diseases of liver: Secondary | ICD-10-CM

## 2011-03-16 DIAGNOSIS — R160 Hepatomegaly, not elsewhere classified: Secondary | ICD-10-CM

## 2011-03-16 DIAGNOSIS — D1803 Hemangioma of intra-abdominal structures: Secondary | ICD-10-CM

## 2011-03-16 DIAGNOSIS — D1809 Hemangioma of other sites: Secondary | ICD-10-CM | POA: Insufficient documentation

## 2011-03-16 HISTORY — DX: Hemangioma of intra-abdominal structures: D18.03

## 2011-03-16 MED ORDER — GADOBENATE DIMEGLUMINE 529 MG/ML IV SOLN: 12.0000 mL | Freq: Once | INTRAVENOUS | Status: AC | PRN

## 2011-03-16 NOTE — Telephone Encounter (Signed)
Reviewed MRI results- benign hemangioma.  Reviewed lab results and MRI results with patient.

## 2011-03-17 ENCOUNTER — Telehealth: Payer: Self-pay | Admitting: *Deleted

## 2011-03-17 NOTE — Telephone Encounter (Signed)
Copy mailed to patient.

## 2011-03-17 NOTE — Telephone Encounter (Signed)
Message copied by Kathi Simpers on Thu Mar 17, 2011  9:02 AM ------      Message from: O'SULLIVAN, MELISSA      Created: Wed Mar 16, 2011  3:25 PM       Pt requested a copy of todays mri be mailed to her pls. Thanks

## 2011-03-22 ENCOUNTER — Ambulatory Visit: Payer: BC Managed Care – PPO | Admitting: Family

## 2011-03-23 ENCOUNTER — Encounter: Payer: Self-pay | Admitting: Family

## 2011-03-23 ENCOUNTER — Ambulatory Visit (INDEPENDENT_AMBULATORY_CARE_PROVIDER_SITE_OTHER): Payer: BC Managed Care – PPO | Admitting: Family

## 2011-03-23 DIAGNOSIS — K5792 Diverticulitis of intestine, part unspecified, without perforation or abscess without bleeding: Secondary | ICD-10-CM

## 2011-03-23 DIAGNOSIS — K5732 Diverticulitis of large intestine without perforation or abscess without bleeding: Secondary | ICD-10-CM

## 2011-03-23 DIAGNOSIS — D1803 Hemangioma of intra-abdominal structures: Secondary | ICD-10-CM

## 2011-03-23 NOTE — Assessment & Plan Note (Signed)
Improving.  Pt is instructed to complete antibiotics and to contact us if diarrhea does not resolve over the next few days.

## 2011-03-23 NOTE — Progress Notes (Signed)
Subjective:    Patient ID: Jocelyn Morgan, female    DOB: 1950-10-28, 60 y.o.   MRN: [id number]  HPI Jocelyn Morgan is a 60 yr old female who presents today for follow up of her Diverticulitis.  Diverticulitis- Pt has completed 8 of 10 days of antibiotics. Reports that she is having 2 loose stools a day which she attributes to a side effect of the antibiotics.  Reports improvement in the left lower quadrant pain. No bright red blood per rectum.    Liver hemangioma- Pt underwent MRI which confirmed that the liver mass seen on CT was actually a hemangioma.      Review of Systems See HPI  Past Medical History  Diagnosis Date  . Hyperlipidemia   . Arthritis     osteoarthritis  . Postmenopausal HRT (hormone replacement therapy)   . Liver hemangioma 03/16/2011    History   Social History  . Marital Status: Married    Spouse Name: N/A    Number of Children: 4  . Years of Education: N/A   Occupational History  . housewife    Social History Main Topics  . Smoking status: Never Smoker   . Smokeless tobacco: Never Used  . Alcohol Use: 0.6 oz/week    1 Glasses of wine per week  . Drug Use: No  . Sexually Active: Yes   Other Topics Concern  . Not on file   Social History Narrative   Exercise: sometimesCaffeine: 2 cups 1/2 and 1/2 coffee daily    Past Surgical History  Procedure Date  . Tubal ligation   . Tubal ligation     Family History  Problem Relation Age of Onset  . Cancer Mother 66    breast  . Arthritis Other   . Diabetes Other   . Cancer Other     bladder    No Known Allergies  Current Outpatient Prescriptions on File Prior to Visit  Medication Sig Dispense Refill  . ciprofloxacin (CIPRO) 500 MG tablet Take 1 tablet (500 mg total) by mouth 2 (two) times daily.  20 tablet  0  . estradiol (ESTRACE) 0.1 MG/GM vaginal cream One gram of cream inserted into vagina twice weekly.  42.5 g  11  . ibuprofen (ADVIL,MOTRIN) 200 MG tablet Take 400 mg by mouth  every 6 (six) hours as needed. pain       . metroNIDAZOLE (FLAGYL) 500 MG tablet Take 1 tablet (500 mg total) by mouth 3 (three) times daily.  30 tablet  0  . aspirin 81 MG EC tablet Take 81 mg by mouth daily.        . Calcium-Vitamin D 500-125 MG-UNIT TABS Take 1 tablet by mouth daily.        . Misc Natural Products (OSTEO BI-FLEX ADV TRIPLE ST) TABS Take 1 tablet by mouth daily.        . Multiple Vitamin (MULTIVITAMIN) tablet Take 1 tablet by mouth daily.        . Omega-3 Fatty Acids (FISH OIL) 1200 MG CAPS Take 1 capsule by mouth daily.          BP 98/70  Pulse 72  Temp(Src) 98.4 F (36.9 C) (Oral)  Resp 16  Ht 5\' 2"  (1.575 m)  Wt 134 lb 1.3 oz (60.818 kg)  BMI 24.52 kg/m2  LMP 07/25/1998       Objective:   Physical Exam  Constitutional: She appears well-developed and well-nourished.  HENT:  Head: Normocephalic and atraumatic.  Cardiovascular:  Normal rate and regular rhythm.   Pulmonary/Chest: Effort normal and breath sounds normal. No respiratory distress. She has no wheezes. She has no rales.  Abdominal: Soft. Bowel sounds are normal. She exhibits no distension and no mass. There is no tenderness. There is no rebound and no guarding.  Psychiatric: She has a normal mood and affect. Her behavior is normal. Judgment and thought content normal.          Assessment & Plan:

## 2011-03-23 NOTE — Patient Instructions (Signed)
Please complete your antibiotics.  Call if you develop fever or recurrent abdominal pain.  Follow up in April for complete physical- come fasting to this appointment.

## 2011-03-23 NOTE — Assessment & Plan Note (Addendum)
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 Will plan to repeat imaging in 6-12 months to ensure that size is stable.

## 2011-05-23 ENCOUNTER — Ambulatory Visit (HOSPITAL_BASED_OUTPATIENT_CLINIC_OR_DEPARTMENT_OTHER)
Admission: RE | Admit: 2011-05-23 | Discharge: 2011-05-23 | Disposition: A | Discharge status: Home / Self Care | Payer: BC Managed Care – PPO | Source: Ambulatory Visit | Attending: Family | Admitting: Family

## 2011-05-23 DIAGNOSIS — Z1231 Encounter for screening mammogram for malignant neoplasm of breast: Secondary | ICD-10-CM | POA: Insufficient documentation

## 2011-12-26 ENCOUNTER — Encounter: Payer: Self-pay | Admitting: Family

## 2011-12-26 ENCOUNTER — Ambulatory Visit (INDEPENDENT_AMBULATORY_CARE_PROVIDER_SITE_OTHER): Payer: BC Managed Care – PPO | Admitting: Family

## 2011-12-26 ENCOUNTER — Other Ambulatory Visit (HOSPITAL_COMMUNITY)
Admission: RE | Admit: 2011-12-26 | Discharge: 2011-12-26 | Disposition: A | Discharge status: Home / Self Care | Payer: BC Managed Care – PPO | Source: Ambulatory Visit | Attending: Family | Admitting: Family

## 2011-12-26 VITALS — BP 98/72 | HR 75 | Temp 98.1°F | Resp 16 | Ht 62.0 in | Wt 137.1 lb

## 2011-12-26 DIAGNOSIS — Z01419 Encounter for gynecological examination (general) (routine) without abnormal findings: Secondary | ICD-10-CM | POA: Insufficient documentation

## 2011-12-26 DIAGNOSIS — R0789 Other chest pain: Secondary | ICD-10-CM

## 2011-12-26 DIAGNOSIS — Z0000 Encounter for general adult medical examination without abnormal findings: Secondary | ICD-10-CM

## 2011-12-26 NOTE — Progress Notes (Signed)
Addended by: Mervin Kung A on: 12/26/2011 01:11 PM   Modules accepted: Orders

## 2011-12-26 NOTE — Assessment & Plan Note (Addendum)
Pt counseled on diet, exercise.  Immunizations reviewed and up to date.  She will return for fasting lab work.  Pap is performed today.

## 2011-12-26 NOTE — Assessment & Plan Note (Signed)
EKG performed today notes TWI in V1 and V2.  I do not have any old EKG's to compare to. Will order stress test.

## 2011-12-26 NOTE — Progress Notes (Signed)
Subjective:    Patient ID: Jocelyn Morgan, female    DOB: July 16, 1951, 61 y.o.   MRN: [id number]  HPI   Preventative- not exercising.  Stopped doing weight watchers in January.  mammo up to date. Due for pap smear.    Chest pain- notes that she has had some brief episodes of left sided chest pain.  The last episode occurred 2 months ago, lasted 1 minute, occurred at rest, resolved on own.  She reports that she has had brief "twinges" when she has done water aerobics.  No CP today.   Review of Systems  Constitutional: Negative for unexpected weight change.       Fatigue.    HENT: Negative for hearing loss and congestion.   Eyes: Negative for visual disturbance.  Respiratory: Negative for cough.   Cardiovascular: Negative for palpitations and leg swelling.       She reports some "stress related chest pain" x 2 minutes-at rest, resolved on its own.    Gastrointestinal: Negative for nausea, diarrhea and blood in stool.  Genitourinary: Negative for dysuria, frequency, hematuria and vaginal discharge.  Musculoskeletal: Negative for myalgias.       Occasional neck pain and thumb pain.  Using osteobi flex which helps  Skin: Negative for rash.  Neurological: Negative for headaches.  Hematological: Negative for adenopathy.   Past Medical History  Diagnosis Date  . Hyperlipidemia   . Arthritis     osteoarthritis  . Postmenopausal HRT (hormone replacement therapy)   . Liver hemangioma 03/16/2011    History   Social History  . Marital Status: Married    Spouse Name: N/A    Number of Children: 4  . Years of Education: N/A   Occupational History  . housewife    Social History Main Topics  . Smoking status: Never Smoker   . Smokeless tobacco: Never Used  . Alcohol Use: 0.6 oz/week    1 Glasses of wine per week  . Drug Use: No  . Sexually Active: Yes   Other Topics Concern  . Not on file   Social History Narrative   Exercise: sometimesCaffeine: 2 cups 1/2 and 1/2 coffee  daily    Past Surgical History  Procedure Date  . Tubal ligation   . Tubal ligation     Family History  Problem Relation Age of Onset  . Cancer Mother 28    breast  . Arthritis Other   . Diabetes Other   . Cancer Other     bladder    No Known Allergies  Current Outpatient Prescriptions on File Prior to Visit  Medication Sig Dispense Refill  . aspirin 81 MG EC tablet Take 81 mg by mouth daily.        . Calcium-Vitamin D 500-125 MG-UNIT TABS Take 1 tablet by mouth daily.        Marland Kitchen estradiol (ESTRACE) 0.1 MG/GM vaginal cream One gram of cream inserted into vagina twice weekly.  42.5 g  11  . ibuprofen (ADVIL,MOTRIN) 200 MG tablet Take 400 mg by mouth every 6 (six) hours as needed. pain       . Misc Natural Products (OSTEO BI-FLEX ADV TRIPLE ST) TABS Take 1 tablet by mouth daily.        . Multiple Vitamin (MULTIVITAMIN) tablet Take 1 tablet by mouth daily.        . Omega-3 Fatty Acids (FISH OIL) 1200 MG CAPS Take 1 capsule by mouth daily.  BP 98/72  Pulse 75  Temp(Src) 98.1 F (36.7 C) (Oral)  Resp 16  Ht 5\' 2"  (1.575 m)  Wt 137 lb 1.9 oz (62.197 kg)  BMI 25.08 kg/m2  SpO2 99%  LMP 07/25/1998      Objective:   Physical Exam Physical Exam  Constitutional: She is oriented to person, place, and time. She appears well-developed and well-nourished. No distress.  HENT:  Head: Normocephalic and atraumatic.  Right Ear: Tympanic membrane and ear canal normal.  Left Ear: Tympanic membrane and ear canal normal.  Mouth/Throat: Oropharynx is clear and moist.  Eyes: Pupils are equal, round, and reactive to light. No scleral icterus.  Neck: Normal range of motion. No thyromegaly present.  Cardiovascular: Normal rate and regular rhythm.   No murmur heard. Pulmonary/Chest: Effort normal and breath sounds normal. No respiratory distress. He has no wheezes. She has no rales. She exhibits no tenderness.  Abdominal: Soft. Bowel sounds are normal. He exhibits no distension and  no mass. There is no tenderness. There is no rebound and no guarding.  Musculoskeletal: She exhibits no edema.  Lymphadenopathy:    She has no cervical adenopathy.  Neurological: She is alert and oriented to person, place, and time.  She exhibits normal muscle tone. Coordination normal.  Skin: Skin is warm and dry. small brown freckle notes on right chin beneath lip. No asymmetry is noted.   Psychiatric: She has a normal mood and affect. Her behavior is normal. Judgment and thought content normal.  Breasts: Examined lying Right: Without masses, retractions, discharge or axillary adenopathy.  Left: Without masses, retractions, discharge or axillary adenopathy.  Inguinal/mons: Normal without inguinal adenopathy  External genitalia: Normal  BUS/Urethra/Skene's glands: Normal  Bladder: Normal  Vagina: Normal, atrophic Cervix: Normal  Uterus: normal in size, shape and contour. Midline and mobile  Adnexa/parametria:  Rt: Without masses or tenderness.  Lt: Without masses or tenderness.  Anus and perineum: Normal           Assessment & Plan:       Assessment & Plan:

## 2011-12-26 NOTE — Patient Instructions (Signed)
Please return fasting to the lab for blood work. We will contact you with your results.

## 2011-12-27 LAB — CBC WITH DIFFERENTIAL/PLATELET
Basophils Absolute: 0 10*3/uL (ref 0.0–0.1)
Eosinophils Relative: 3 % (ref 0–5)
Lymphocytes Relative: 27 % (ref 12–46)
Neutro Abs: 3.3 10*3/uL (ref 1.7–7.7)
Platelets: 379 10*3/uL (ref 150–400)
RDW: 13.3 % (ref 11.5–15.5)
WBC: 5.3 10*3/uL (ref 4.0–10.5)

## 2011-12-27 LAB — LIPID PANEL
Cholesterol: 231 mg/dL — ABNORMAL HIGH (ref 0–200)
LDL Cholesterol: 152 mg/dL — ABNORMAL HIGH (ref 0–99)
Triglycerides: 182 mg/dL — ABNORMAL HIGH (ref <150)

## 2011-12-27 LAB — HEPATIC FUNCTION PANEL
Alkaline Phosphatase: 50 U/L (ref 39–117)
Bilirubin, Direct: 0.1 mg/dL (ref 0.0–0.3)
Indirect Bilirubin: 0.4 mg/dL (ref 0.0–0.9)
Total Protein: 7.2 g/dL (ref 6.0–8.3)

## 2011-12-27 LAB — BASIC METABOLIC PANEL WITH GFR
Calcium: 9.7 mg/dL (ref 8.4–10.5)
Creat: 0.67 mg/dL (ref 0.50–1.10)
GFR, Est Non African American: >89 mL/min

## 2011-12-28 LAB — URINALYSIS, ROUTINE W REFLEX MICROSCOPIC
Bilirubin Urine: NEGATIVE
Glucose, UA: NEGATIVE mg/dL
Ketones, ur: NEGATIVE mg/dL
Specific Gravity, Urine: 1.005 (ref 1.005–1.030)
Urobilinogen, UA: 0.2 mg/dL (ref 0.0–1.0)

## 2011-12-29 ENCOUNTER — Encounter: Payer: Self-pay | Admitting: Family

## 2012-01-02 ENCOUNTER — Encounter: Payer: Self-pay | Admitting: Family

## 2012-01-06 ENCOUNTER — Ambulatory Visit (INDEPENDENT_AMBULATORY_CARE_PROVIDER_SITE_OTHER): Payer: BC Managed Care – PPO | Admitting: Nurse Practitioner

## 2012-01-06 ENCOUNTER — Encounter: Payer: Self-pay | Admitting: Nurse Practitioner

## 2012-01-06 DIAGNOSIS — R9431 Abnormal electrocardiogram [ECG] [EKG]: Secondary | ICD-10-CM

## 2012-01-06 DIAGNOSIS — Z0000 Encounter for general adult medical examination without abnormal findings: Secondary | ICD-10-CM

## 2012-01-06 NOTE — Procedures (Signed)
Exercise Treadmill Test  Pre-Exercise Testing Evaluation Rhythm: normal sinus  Rate: 82   PR:  .14 QRS:  .09  QT:  .36 QTc: .42     Test  Exercise Tolerance Test Ordering MD: Sandford Craze NP  Interpreting MD: Ward Givens NP  Unique Test No: 1  Treadmill:  1  Indication for ETT: Abnormal EKG  Contraindication to ETT: No   Stress Modality: exercise - treadmill  Cardiac Imaging Performed: non   Protocol: standard Bruce - maximal  Max BP: 176/82  Max MPHR (bpm):  158 85% MPR (bpm):  134  MPHR obtained (bpm):  171 % MPHR obtained:  108%  Reached 85% MPHR (min:sec): 4:40 Total Exercise Time (min-sec):  9:00  Workload in METS:  10.1 Borg Scale: 16  Reason ETT Terminated:  fatigue    ST Segment Analysis At Rest: normal ST segments - no evidence of significant ST depression With Exercise: no evidence of significant ST depression  Other Information Arrhythmia:  occasional PVC's and 2 couplet's with exercise and in recovery. Angina during ETT:  absent (0) Quality of ETT:  diagnostic  ETT Interpretation:  normal - no evidence of ischemia by ST analysis  Comments: Good exercise tolerance.  No chest pain.  ECG w/o acute st/t changes.  Low-risk study.    Recommendations: Follow-up with PCP as scheduled.

## 2012-01-09 ENCOUNTER — Telehealth: Payer: Self-pay | Admitting: *Deleted

## 2012-01-09 DIAGNOSIS — E7800 Pure hypercholesterolemia, unspecified: Secondary | ICD-10-CM

## 2012-01-09 MED ORDER — SIMVASTATIN 10 MG PO TABS: 10.0000 mg | ORAL_TABLET | Freq: Every day | ORAL | Status: DC

## 2012-01-09 NOTE — Telephone Encounter (Signed)
Spoke with pt.  Will start simvastatin 10mg .  She is instructed to return to the lab in 6 weeks for FLP, LFT please.

## 2012-01-09 NOTE — Telephone Encounter (Signed)
Pt states she is not currently taking any cholesterol medication and wants to know if she should restart medication as it looked like her cholesterol numbers had gone up slightly. Also wants to know when you would like her to follow up?  Please advise.

## 2012-01-09 NOTE — Telephone Encounter (Signed)
Future order placed for the week of 02/20/12 and copy given to the lab.

## 2012-01-12 ENCOUNTER — Encounter: Payer: BC Managed Care – PPO | Admitting: Physician Assistant

## 2012-03-19 ENCOUNTER — Encounter: Payer: Self-pay | Admitting: Family

## 2012-03-29 LAB — HEPATIC FUNCTION PANEL
ALT: 12 U/L (ref 0–35)
AST: 18 U/L (ref 0–37)
Alkaline Phosphatase: 51 U/L (ref 39–117)
Bilirubin, Direct: 0.1 mg/dL (ref 0.0–0.3)
Indirect Bilirubin: 0.4 mg/dL (ref 0.0–0.9)

## 2012-03-29 LAB — LIPID PANEL
Cholesterol: 153 mg/dL (ref 0–200)
LDL Cholesterol: 85 mg/dL (ref 0–99)
Total CHOL/HDL Ratio: 3.7 Ratio
VLDL: 27 mg/dL (ref 0–40)

## 2012-03-29 NOTE — Addendum Note (Signed)
Addended by: Mervin Kung A on: 03/29/2012 09:14 AM   Modules accepted: Orders

## 2012-03-29 NOTE — Telephone Encounter (Signed)
Pt presented to the lab, future orders released. 

## 2012-03-30 ENCOUNTER — Telehealth: Payer: Self-pay | Admitting: Family

## 2012-03-30 MED ORDER — SIMVASTATIN 10 MG PO TABS: 10.0000 mg | ORAL_TABLET | Freq: Every day | ORAL | Status: DC

## 2012-03-30 NOTE — Telephone Encounter (Signed)
See order.

## 2012-05-15 ENCOUNTER — Other Ambulatory Visit: Payer: Self-pay | Admitting: Family

## 2012-05-15 DIAGNOSIS — Z1231 Encounter for screening mammogram for malignant neoplasm of breast: Secondary | ICD-10-CM

## 2012-05-24 ENCOUNTER — Ambulatory Visit (HOSPITAL_BASED_OUTPATIENT_CLINIC_OR_DEPARTMENT_OTHER)
Admission: RE | Admit: 2012-05-24 | Discharge: 2012-05-24 | Disposition: A | Discharge status: Home / Self Care | Payer: BC Managed Care – PPO | Source: Ambulatory Visit | Attending: Family | Admitting: Family

## 2012-05-24 DIAGNOSIS — Z1231 Encounter for screening mammogram for malignant neoplasm of breast: Secondary | ICD-10-CM

## 2012-07-20 ENCOUNTER — Telehealth: Payer: Self-pay | Admitting: Family

## 2012-07-20 NOTE — Telephone Encounter (Signed)
[facility] Triage Call Report Triage Record Num: [id number] Operator: Estevan Oaks Patient Name: Bennetta Rudden Call Date & Time: 07/19/2012 6:03:35PM Patient Phone: [phone] PCP: Patient Gender: Female PCP Fax : Patient DOB: 02-Feb-1951 Practice Name: [facility]  Reason for Call: Caller: Mikala/Patient; PCP: Other; CB#: [phone]; Call regarding Diverticulitis?; Health hx reviewed in Epic. Pt of [facility]. Mrs Stainback is calling from [place]. Says her provider told her if she had sx's of Diverticulitis again to call and ABX will be called in. Having watery diarrhea, lower abd cramping, afebrile. Onset 0930. She says the sx's are better for the last 4-5 hrs--has not eaten much since breakfast. Diarrhea, all ER sx's ruled out. Home care is disposition but Rn advised to call office in the morning if sx's persist. Advised to nearest ED if they worsen again. Agrees. Protocol(s) Used: Diarrhea or Other Change in Bowel Habits Recommended Outcome per Protocol: Provide Home/Self Care Override Outcome if Used in Protocol: Call Provider within 24 Hours RN Reason for Override Outcome: Nursing Judgement Used.

## 2012-07-20 NOTE — Telephone Encounter (Signed)
Pt reports that she called yesterday at 1:30 yesterday and again at 4PM.  She reports that she started to have diarrhea/cramping yesterday AM.  Denies fever or lower abdomina tenderness. Had some mucous in the stool, denies blood.  Reports symptoms are improved. No current diarrhea or pain. Advised pt to call for apt in Saturday clinic if symptoms worsen overnight, otherwise if recurrent symptoms later in the weekend, she can call office to be seen on Monday.  Symptoms sound most likely viral, but if recurrent will need further evaluation for diverticulitis.  Pt verbalizes understanding.

## 2012-09-03 ENCOUNTER — Telehealth: Payer: Self-pay | Admitting: Family

## 2012-09-03 NOTE — Telephone Encounter (Signed)
Refill- simvastatin 10mg  tablets. Take one tablet by mouth at bedtime. Qty 30 last fill 2.5.14

## 2012-09-04 MED ORDER — SIMVASTATIN 10 MG PO TABS: 10.0000 mg | ORAL_TABLET | Freq: Every day | ORAL | Status: DC

## 2012-09-04 NOTE — Telephone Encounter (Signed)
Refill sent, #30 2 refills.

## 2012-09-08 ENCOUNTER — Other Ambulatory Visit: Payer: Self-pay

## 2012-10-15 ENCOUNTER — Ambulatory Visit (INDEPENDENT_AMBULATORY_CARE_PROVIDER_SITE_OTHER): Payer: BC Managed Care – PPO | Admitting: Family

## 2012-10-15 ENCOUNTER — Encounter: Payer: Self-pay | Admitting: Family

## 2012-10-15 VITALS — BP 116/70 | HR 68 | Temp 98.0°F | Resp 16 | Ht 62.0 in | Wt 140.0 lb

## 2012-10-15 DIAGNOSIS — R221 Localized swelling, mass and lump, neck: Secondary | ICD-10-CM

## 2012-10-15 DIAGNOSIS — K1379 Other lesions of oral mucosa: Secondary | ICD-10-CM | POA: Insufficient documentation

## 2012-10-15 DIAGNOSIS — K5732 Diverticulitis of large intestine without perforation or abscess without bleeding: Secondary | ICD-10-CM | POA: Insufficient documentation

## 2012-10-15 DIAGNOSIS — R220 Localized swelling, mass and lump, head: Secondary | ICD-10-CM

## 2012-10-15 DIAGNOSIS — E7800 Pure hypercholesterolemia, unspecified: Secondary | ICD-10-CM

## 2012-10-15 DIAGNOSIS — K148 Other diseases of tongue: Secondary | ICD-10-CM

## 2012-10-15 DIAGNOSIS — E785 Hyperlipidemia, unspecified: Secondary | ICD-10-CM

## 2012-10-15 LAB — HEPATIC FUNCTION PANEL
Bilirubin, Direct: 0.1 mg/dL (ref 0.0–0.3)
Indirect Bilirubin: 0.4 mg/dL (ref 0.0–0.9)
Total Bilirubin: 0.5 mg/dL (ref 0.3–1.2)

## 2012-10-15 MED ORDER — SIMVASTATIN 10 MG PO TABS: 10.0000 mg | ORAL_TABLET | Freq: Every day | ORAL | Status: DC

## 2012-10-15 NOTE — Assessment & Plan Note (Signed)
Asymptomatic at present. Symptoms described could have been due to mild diverticulitis or constipation/hemorrhoids. She did have colo 2012 which noted on polyp and a 5 year follow up was recommended.  I have asked th pt to contact me if she has recurrent symptoms.

## 2012-10-15 NOTE — Assessment & Plan Note (Signed)
Stable, continue statin, obtain lft.

## 2012-10-15 NOTE — Patient Instructions (Addendum)
Please complete your lab work prior to leaving.  Please schedule complete physical after 12/25/12.

## 2012-10-15 NOTE — Assessment & Plan Note (Signed)
" >>  ASSESSMENT AND PLAN FOR HYPERCHOLESTEROLEMIA WRITTEN ON 10/15/2012  9:44 AM BY O'SULLIVAN, Adler Alton, NP  Stable, continue statin, obtain lft.  "

## 2012-10-15 NOTE — Progress Notes (Signed)
Subjective:    Patient ID: Jocelyn Morgan, female    DOB: Sep 27, 1950, 62 y.o.   MRN: [id number]  HPI  Jocelyn Morgan is a 62 yr old female who presents today for follow up.  1) Hyperlipidemia- she continues simvastatin.  LDL in September was improved and at goal at 85.   LFT's were also normal at that time.  2) Hx Diverticultis- reports that after vacation she had some constipation.  Noted some blood tinged mucous stool.  Had abdominal cramping.  Reports that she switched to a liquid diet x 24 hours and symptoms resolved.    3) Salivary gland enlargement-  Went to the dentist last week for check up. Reports that she had a cyst under her tongue. She was referred to oral surgeon.  Has a large up front cost for visit.  Wonder's if this is the appropriate referral.     Review of Systems    see HPI  Past Medical History  Diagnosis Date  . Hyperlipidemia   . Arthritis     osteoarthritis  . Postmenopausal HRT (hormone replacement therapy)   . Liver hemangioma 03/16/2011    History   Social History  . Marital Status: Married    Spouse Name: N/A    Number of Children: 4  . Years of Education: N/A   Occupational History  . housewife    Social History Main Topics  . Smoking status: Never Smoker   . Smokeless tobacco: Never Used  . Alcohol Use: 0.6 oz/week    1 Glasses of wine per week  . Drug Use: No  . Sexually Active: Yes   Other Topics Concern  . Not on file   Social History Narrative   Exercise: sometimes   Caffeine: 2 cups 1/2 and 1/2 coffee daily          Past Surgical History  Procedure Laterality Date  . Tubal ligation    . Tubal ligation      Family History  Problem Relation Age of Onset  . Cancer Mother 75    breast  . Arthritis Other   . Diabetes Other   . Cancer Other     bladder    No Known Allergies  Current Outpatient Prescriptions on File Prior to Visit  Medication Sig Dispense Refill  . aspirin 81 MG EC tablet Take 81 mg by mouth  daily.        . Calcium-Vitamin D 500-125 MG-UNIT TABS Take 1 tablet by mouth daily.        Marland Kitchen estradiol (ESTRACE) 0.1 MG/GM vaginal cream One gram of cream inserted into vagina twice weekly.  42.5 g  11  . ibuprofen (ADVIL,MOTRIN) 200 MG tablet Take 400 mg by mouth every 6 (six) hours as needed. pain       . Misc Natural Products (OSTEO BI-FLEX ADV TRIPLE ST) TABS Take 1 tablet by mouth daily.        . Multiple Vitamin (MULTIVITAMIN) tablet Take 1 tablet by mouth daily.        . Omega-3 Fatty Acids (FISH OIL) 1200 MG CAPS Take 1 capsule by mouth daily.         No current facility-administered medications on file prior to visit.    BP 116/70  Pulse 68  Temp(Src) 98 F (36.7 C) (Oral)  Resp 16  Ht 5\' 2"  (1.575 m)  Wt 140 lb (63.504 kg)  BMI 25.6 kg/m2  SpO2 97%  LMP 07/25/1998    Objective:  Physical Exam  Constitutional: She is oriented to person, place, and time. She appears well-developed and well-nourished. No distress.  HENT:  Head: Normocephalic and atraumatic.  Fibrous small cyst noted beneath tongue on left. Non tender.  Cardiovascular: Normal rate and regular rhythm.   No murmur heard. Pulmonary/Chest: Effort normal and breath sounds normal. No respiratory distress. She has no wheezes. She has no rales. She exhibits no tenderness.  Musculoskeletal: She exhibits no edema.  Neurological: She is alert and oriented to person, place, and time.  Skin: Skin is warm and dry.  Psychiatric: She has a normal mood and affect. Her behavior is normal. Judgment and thought content normal.          Assessment & Plan:

## 2012-10-15 NOTE — Assessment & Plan Note (Signed)
Refer to ENT for further evaluation.  I do not think she needs to see oral surgeon for this.

## 2013-03-29 ENCOUNTER — Telehealth: Payer: Self-pay | Admitting: *Deleted

## 2013-03-29 ENCOUNTER — Ambulatory Visit (INDEPENDENT_AMBULATORY_CARE_PROVIDER_SITE_OTHER): Payer: BC Managed Care – PPO | Admitting: Family

## 2013-03-29 ENCOUNTER — Encounter: Payer: Self-pay | Admitting: Family

## 2013-03-29 VITALS — BP 106/80 | HR 66 | Temp 97.8°F | Resp 16 | Ht 62.0 in | Wt 137.0 lb

## 2013-03-29 DIAGNOSIS — E7800 Pure hypercholesterolemia, unspecified: Secondary | ICD-10-CM

## 2013-03-29 DIAGNOSIS — Z0000 Encounter for general adult medical examination without abnormal findings: Secondary | ICD-10-CM

## 2013-03-29 LAB — CBC WITH DIFFERENTIAL/PLATELET
Basophils Absolute: 0 10*3/uL (ref 0.0–0.1)
Basophils Relative: 1 % (ref 0–1)
Eosinophils Absolute: 0.1 10*3/uL (ref 0.0–0.7)
Lymphs Abs: 1.4 10*3/uL (ref 0.7–4.0)
MCH: 30.6 pg (ref 26.0–34.0)
Neutrophils Relative %: 63 % (ref 43–77)
Platelets: 325 10*3/uL (ref 150–400)
RBC: 4.34 MIL/uL (ref 3.87–5.11)
RDW: 13.5 % (ref 11.5–15.5)

## 2013-03-29 LAB — LIPID PANEL
Cholesterol: 154 mg/dL (ref 0–200)
HDL: 43 mg/dL (ref >39)
Total CHOL/HDL Ratio: 3.6 Ratio
Triglycerides: 108 mg/dL (ref <150)

## 2013-03-29 LAB — BASIC METABOLIC PANEL WITH GFR
Calcium: 9.6 mg/dL (ref 8.4–10.5)
Creat: 0.65 mg/dL (ref 0.50–1.10)
GFR, Est African American: >89 mL/min
Glucose, Bld: 91 mg/dL (ref 70–99)
Sodium: 139 mEq/L (ref 135–145)

## 2013-03-29 LAB — HEPATIC FUNCTION PANEL
Albumin: 4.6 g/dL (ref 3.5–5.2)
Alkaline Phosphatase: 46 U/L (ref 39–117)
Total Bilirubin: 0.6 mg/dL (ref 0.3–1.2)
Total Protein: 6.7 g/dL (ref 6.0–8.3)

## 2013-03-29 NOTE — Patient Instructions (Signed)
Please complete lab work prior to leaving. Follow up in 1 year, sooner if problems/concerns.  

## 2013-03-29 NOTE — Progress Notes (Signed)
Subjective:    Patient ID: Jocelyn Morgan, female    DOB: 07-26-1950, 62 y.o.   MRN: [id number]  HPI  Patient presents today for complete physical.  Immunizations: She wishes to defer flu shot for now.  Diet: has been working on eating healthing Exercise: reports that she has been walking or biking 4 days a week x 30 minutes for 2 months. Colonoscopy: Was told she will be due 2017 Dexa: due Pap Smear: up to date Mammogram: She will complete in November   Review of Systems  Constitutional: Negative for unexpected weight change.  HENT: Negative for congestion.        ? Hearing problem.  Wishes to defer testing  Eyes: Negative for visual disturbance.  Respiratory: Negative for cough.   Cardiovascular: Negative for chest pain.  Gastrointestinal: Negative for vomiting, diarrhea and constipation.  Genitourinary: Negative for dysuria and frequency.  Musculoskeletal:       Neck pain, worse with crafting  Skin: Negative for rash.  Neurological:       Mild tension headaches  Hematological: Negative for adenopathy.  Psychiatric/Behavioral:       Denies depression/anxiety   Past Medical History  Diagnosis Date  . Hyperlipidemia   . Arthritis     osteoarthritis  . Postmenopausal HRT (hormone replacement therapy)   . Liver hemangioma 03/16/2011    History   Social History  . Marital Status: Married    Spouse Name: N/A    Number of Children: 4  . Years of Education: N/A   Occupational History  . housewife    Social History Main Topics  . Smoking status: Never Smoker   . Smokeless tobacco: Never Used  . Alcohol Use: 0.6 oz/week    1 Glasses of wine per week  . Drug Use: No  . Sexual Activity: Yes   Other Topics Concern  . Not on file   Social History Narrative   Exercise: sometimes   Caffeine: 2 cups 1/2 and 1/2 coffee daily          Past Surgical History  Procedure Laterality Date  . Tubal ligation    . Tubal ligation      Family History  Problem  Relation Age of Onset  . Cancer Mother 74    breast  . Arthritis Other   . Diabetes Other   . Cancer Other     bladder  . Ulcerative colitis Brother     No Known Allergies  Current Outpatient Prescriptions on File Prior to Visit  Medication Sig Dispense Refill  . aspirin 81 MG EC tablet Take 81 mg by mouth daily.        . Calcium-Vitamin D 500-125 MG-UNIT TABS Take 1 tablet by mouth daily.        Marland Kitchen ibuprofen (ADVIL,MOTRIN) 200 MG tablet Take 400 mg by mouth every 6 (six) hours as needed. pain       . Misc Natural Products (OSTEO BI-FLEX ADV TRIPLE ST) TABS Take 1 tablet by mouth daily.        . Multiple Vitamin (MULTIVITAMIN) tablet Take 1 tablet by mouth daily.        . Omega-3 Fatty Acids (FISH OIL) 1200 MG CAPS Take 1 capsule by mouth daily.        . simvastatin (ZOCOR) 10 MG tablet Take 1 tablet (10 mg total) by mouth at bedtime.  30 tablet  5   No current facility-administered medications on file prior to visit.    BP 106/80  Pulse 66  Temp(Src) 97.8 F (36.6 C) (Oral)  Resp 16  Ht 5\' 2"  (1.575 m)  Wt 137 lb (62.143 kg)  BMI 25.05 kg/m2  SpO2 99%  LMP 07/25/1998        Objective:   Physical Exam  Physical Exam  Constitutional: She is oriented to person, place, and time. She appears well-developed and well-nourished. No distress.  HENT:  Head: Normocephalic and atraumatic.  Right Ear: Tympanic membrane and ear canal normal.  Left Ear: Tympanic membrane and ear canal normal.  Mouth/Throat: Oropharynx is clear and moist.  Eyes: Pupils are equal, round, and reactive to light. No scleral icterus.  Neck: Normal range of motion. No thyromegaly present.  Cardiovascular: Normal rate and regular rhythm.   No murmur heard. Pulmonary/Chest: Effort normal and breath sounds normal. No respiratory distress. He has no wheezes. She has no rales. She exhibits no tenderness.  Abdominal: Soft. Bowel sounds are normal. He exhibits no distension and no mass. There is no  tenderness. There is no rebound and no guarding.  Musculoskeletal: She exhibits no edema.  Lymphadenopathy:    She has no cervical adenopathy.  Neurological: She is alert and oriented to person, place, and time. She exhibits normal muscle tone. Coordination normal.  Skin: Skin is warm and dry.  Psychiatric: She has a normal mood and affect. Her behavior is normal. Judgment and thought content normal.  Breasts: Examined lying Right: Without masses, retractions, discharge or axillary adenopathy.  Left: Without masses, retractions, discharge or axillary adenopathy.  Pelvic:deferred           Assessment & Plan:         Assessment & Plan:

## 2013-03-29 NOTE — Telephone Encounter (Signed)
Pt's instructions at check out today was: Return visit recommended follow up in 1 year.  Pt states she usually follows up every 6 months due to her cholesterol.  Please advise.

## 2013-03-29 NOTE — Telephone Encounter (Signed)
Lets have her complete lab work FLP/LFT in 6 months.  I don't need to see her for visit unless she has new concerns or we have a problem with her cholesterol numbers.

## 2013-03-30 LAB — URINALYSIS, ROUTINE W REFLEX MICROSCOPIC
Bilirubin Urine: NEGATIVE
Glucose, UA: NEGATIVE mg/dL
Hgb urine dipstick: NEGATIVE
Ketones, ur: NEGATIVE mg/dL
Specific Gravity, Urine: <1.005 — ABNORMAL LOW (ref 1.005–1.030)
pH: 7 (ref 5.0–8.0)

## 2013-03-31 NOTE — Assessment & Plan Note (Signed)
Cont healthy diet exercise.obtain fasting labs, pt to schedule mammogram. Decline flu shot today.

## 2013-04-01 ENCOUNTER — Ambulatory Visit (INDEPENDENT_AMBULATORY_CARE_PROVIDER_SITE_OTHER)
Admission: RE | Admit: 2013-04-01 | Discharge: 2013-04-01 | Disposition: A | Discharge status: Home / Self Care | Payer: BC Managed Care – PPO | Source: Ambulatory Visit | Attending: Family | Admitting: Family

## 2013-04-01 DIAGNOSIS — Z0000 Encounter for general adult medical examination without abnormal findings: Secondary | ICD-10-CM

## 2013-04-01 NOTE — Telephone Encounter (Signed)
Left message with pt's husband to have pt return my call. 

## 2013-04-02 MED ORDER — SIMVASTATIN 10 MG PO TABS: 10.0000 mg | ORAL_TABLET | Freq: Every day | ORAL | Status: DC

## 2013-04-02 NOTE — Telephone Encounter (Signed)
Attempted to reach pt. Left detailed message with pt's husband re: below instructions. Future lab order entered. He requests that refill of simvastatin be sent to pt's pharmacy.

## 2013-04-08 ENCOUNTER — Encounter: Payer: Self-pay | Admitting: Family

## 2013-05-30 ENCOUNTER — Other Ambulatory Visit: Payer: Self-pay

## 2013-05-31 ENCOUNTER — Other Ambulatory Visit: Payer: Self-pay | Admitting: Family

## 2013-05-31 DIAGNOSIS — Z1231 Encounter for screening mammogram for malignant neoplasm of breast: Secondary | ICD-10-CM

## 2013-06-03 ENCOUNTER — Ambulatory Visit (HOSPITAL_BASED_OUTPATIENT_CLINIC_OR_DEPARTMENT_OTHER)
Admission: RE | Admit: 2013-06-03 | Discharge: 2013-06-03 | Disposition: A | Discharge status: Home / Self Care | Payer: BC Managed Care – PPO | Source: Ambulatory Visit | Attending: Family | Admitting: Family

## 2013-06-03 DIAGNOSIS — Z1231 Encounter for screening mammogram for malignant neoplasm of breast: Secondary | ICD-10-CM | POA: Insufficient documentation

## 2013-09-30 ENCOUNTER — Other Ambulatory Visit: Payer: Self-pay | Admitting: Family

## 2013-09-30 LAB — LIPID PANEL
CHOLESTEROL: 156 mg/dL (ref 0–200)
HDL: 45 mg/dL (ref >39)
LDL Cholesterol: 92 mg/dL (ref 0–99)
TRIGLYCERIDES: 97 mg/dL (ref <150)
Total CHOL/HDL Ratio: 3.5 Ratio
VLDL: 19 mg/dL (ref 0–40)

## 2013-09-30 LAB — HEPATIC FUNCTION PANEL
ALK PHOS: 40 U/L (ref 39–117)
ALT: 13 U/L (ref 0–35)
AST: 15 U/L (ref 0–37)
Albumin: 4.5 g/dL (ref 3.5–5.2)
BILIRUBIN INDIRECT: 0.6 mg/dL (ref 0.2–1.2)
Bilirubin, Direct: 0.1 mg/dL (ref 0.0–0.3)
TOTAL PROTEIN: 6.5 g/dL (ref 6.0–8.3)
Total Bilirubin: 0.7 mg/dL (ref 0.2–1.2)

## 2013-10-01 ENCOUNTER — Encounter: Payer: Self-pay | Admitting: Family

## 2013-10-11 ENCOUNTER — Encounter: Payer: Self-pay | Admitting: Family

## 2013-10-11 ENCOUNTER — Ambulatory Visit (INDEPENDENT_AMBULATORY_CARE_PROVIDER_SITE_OTHER): Payer: BC Managed Care – PPO | Admitting: Family

## 2013-10-11 ENCOUNTER — Telehealth: Payer: Self-pay | Admitting: Family

## 2013-10-11 ENCOUNTER — Ambulatory Visit (HOSPITAL_BASED_OUTPATIENT_CLINIC_OR_DEPARTMENT_OTHER)
Admission: RE | Admit: 2013-10-11 | Discharge: 2013-10-11 | Disposition: A | Discharge status: Home / Self Care | Payer: BC Managed Care – PPO | Source: Ambulatory Visit | Attending: Family | Admitting: Family

## 2013-10-11 VITALS — BP 116/78 | HR 118 | Temp 98.9°F | Resp 16

## 2013-10-11 DIAGNOSIS — R3129 Other microscopic hematuria: Secondary | ICD-10-CM

## 2013-10-11 DIAGNOSIS — R1032 Left lower quadrant pain: Secondary | ICD-10-CM

## 2013-10-11 DIAGNOSIS — R109 Unspecified abdominal pain: Secondary | ICD-10-CM

## 2013-10-11 LAB — POCT URINALYSIS DIPSTICK
Bilirubin, UA: NEGATIVE
GLUCOSE UA: NEGATIVE
Leukocytes, UA: NEGATIVE
Nitrite, UA: NEGATIVE
PROTEIN UA: NEGATIVE
UROBILINOGEN UA: 0.2
pH, UA: 5

## 2013-10-11 MED ORDER — METRONIDAZOLE 500 MG PO TABS: 500.0000 mg | ORAL_TABLET | Freq: Two times a day (BID) | ORAL | Status: DC

## 2013-10-11 MED ORDER — CIPROFLOXACIN HCL 500 MG PO TABS: 500.0000 mg | ORAL_TABLET | Freq: Two times a day (BID) | ORAL | Status: DC

## 2013-10-11 NOTE — Progress Notes (Signed)
Subjective:    Patient ID: Jocelyn Morgan, female    DOB: Jun 27, 1951, 63 y.o.   MRN: [id number]  HPI  [name] is a 63 yr old female who presents today with chief complaint of LLQ abdominal pain. She reports that pain started yesterday afternoon. Reports associated fatigue/malaise. + hx of diverticulitis.  Pain has been associated with a low grade fever- tmax 100 at noon today. Reports generalized aching. Denies associated nausea or hematochezia.  Reports 3 soft normal BM's this AM.    Review of Systems See HPI  Past Medical History  Diagnosis Date  . Hyperlipidemia   . Arthritis     osteoarthritis  . Postmenopausal HRT (hormone replacement therapy)   . Liver hemangioma 03/16/2011    History   Social History  . Marital Status: Married    Spouse Name: N/A    Number of Children: 4  . Years of Education: N/A   Occupational History  . housewife    Social History Main Topics  . Smoking status: Never Smoker   . Smokeless tobacco: Never Used  . Alcohol Use: 0.6 oz/week    1 Glasses of wine per week  . Drug Use: No  . Sexual Activity: Yes   Other Topics Concern  . Not on file   Social History Narrative   Exercise: sometimes   Caffeine: 2 cups 1/2 and 1/2 coffee daily          Past Surgical History  Procedure Laterality Date  . Tubal ligation    . Tubal ligation      Family History  Problem Relation Age of Onset  . Cancer Mother 59    breast  . Arthritis Other   . Diabetes Other   . Cancer Other     bladder  . Ulcerative colitis Brother     No Known Allergies  Current Outpatient Prescriptions on File Prior to Visit  Medication Sig Dispense Refill  . aspirin 81 MG EC tablet Take 81 mg by mouth daily.        . Calcium-Vitamin D 500-125 MG-UNIT TABS Take 1 tablet by mouth daily.        Marland Kitchen ibuprofen (ADVIL,MOTRIN) 200 MG tablet Take 400 mg by mouth every 6 (six) hours as needed. pain       . Misc Natural Products (OSTEO BI-FLEX ADV TRIPLE ST) TABS Take  1 tablet by mouth daily.        . Multiple Vitamin (MULTIVITAMIN) tablet Take 1 tablet by mouth daily.        . Omega-3 Fatty Acids (FISH OIL) 1200 MG CAPS Take 1 capsule by mouth daily.        . simvastatin (ZOCOR) 10 MG tablet Take 1 tablet (10 mg total) by mouth at bedtime.  30 tablet  6   No current facility-administered medications on file prior to visit.    BP 116/78  Pulse 118  Temp(Src) 98.9 F (37.2 C) (Oral)  Resp 16  SpO2 97%  LMP 07/25/1998       Objective:   Physical Exam  Constitutional: She is oriented to person, place, and time. She appears well-developed and well-nourished. No distress.  HENT:  Head: Normocephalic and atraumatic.  Cardiovascular: Normal rate.   No murmur heard. Mild tachycardia  Pulmonary/Chest: Effort normal and breath sounds normal. No respiratory distress. She has no wheezes. She has no rales. She exhibits no tenderness.  Abdominal: Soft. Bowel sounds are decreased. There is no rigidity, no rebound and  no guarding.    Mild LLQ tenderness, most tender closer to midline as marked.    Musculoskeletal: She exhibits no edema.  Neurological: She is alert and oriented to person, place, and time.  Skin: Skin is warm and dry.  Psychiatric: She has a normal mood and affect. Her behavior is normal. Judgment and thought content normal.          Assessment & Plan:

## 2013-10-11 NOTE — Assessment & Plan Note (Signed)
UA notes + blood.  Nephrolithiasis is a consideration.  Also + hx of diverticulitis.  She is mildly tachycardic and has had a low grade temp. Refer for CT abdomen/pelvis and start empiric cipro and flagyl.

## 2013-10-11 NOTE — Telephone Encounter (Signed)
Spoke to pt. Advised her to come to office now to be seen. She is agreeable.

## 2013-10-11 NOTE — Progress Notes (Signed)
Pre visit review using our clinic review tool, if applicable. No additional management support is needed unless otherwise documented below in the visit note. 

## 2013-10-11 NOTE — Patient Instructions (Signed)
Start cipro/flagyl this evening. Go to ER if severe or worsening abdominal pain occur. Call if symptoms worsen or if not improved in 2-3 days.

## 2013-10-11 NOTE — Telephone Encounter (Signed)
Having left lower quad pain, temp 100. Has had diverticulitis in the past 08/12. Please advise

## 2013-10-12 ENCOUNTER — Telehealth: Payer: Self-pay | Admitting: Family

## 2013-10-12 LAB — URINALYSIS, ROUTINE W REFLEX MICROSCOPIC
Bilirubin Urine: NEGATIVE
GLUCOSE, UA: NEGATIVE mg/dL
KETONES UR: 15 mg/dL — AB
LEUKOCYTES UA: NEGATIVE
Nitrite: NEGATIVE
PROTEIN: NEGATIVE mg/dL
Specific Gravity, Urine: 1.008 (ref 1.005–1.030)
Urobilinogen, UA: 0.2 mg/dL (ref 0.0–1.0)
pH: 5.5 (ref 5.0–8.0)

## 2013-10-12 LAB — URINALYSIS, MICROSCOPIC ONLY
Bacteria, UA: NONE SEEN
CASTS: NONE SEEN
Crystals: NONE SEEN
Squamous Epithelial / LPF: NONE SEEN

## 2013-10-12 NOTE — Telephone Encounter (Signed)
Reviewed CT results with pt.  Of note- she had liver MRI in 2012 which confirmed hemangioma of the liver which is again noted.  Pt reports some improvement in her symptoms. Advised her on a liquid diet for the next few days then advance as tolerated to bland diet.  Continue abx, follow up in 1 week.  Pt verbalizes understanding.

## 2013-10-13 ENCOUNTER — Encounter: Payer: Self-pay | Admitting: Family

## 2013-10-13 LAB — URINE CULTURE
Colony Count: NO GROWTH
Organism ID, Bacteria: NO GROWTH

## 2013-10-18 ENCOUNTER — Encounter: Payer: Self-pay | Admitting: Family

## 2013-10-18 ENCOUNTER — Ambulatory Visit (INDEPENDENT_AMBULATORY_CARE_PROVIDER_SITE_OTHER): Payer: BC Managed Care – PPO | Admitting: Family

## 2013-10-18 VITALS — BP 120/70 | HR 83 | Temp 98.1°F | Resp 16 | Ht 62.0 in | Wt 139.1 lb

## 2013-10-18 DIAGNOSIS — G5601 Carpal tunnel syndrome, right upper limb: Secondary | ICD-10-CM

## 2013-10-18 DIAGNOSIS — K5792 Diverticulitis of intestine, part unspecified, without perforation or abscess without bleeding: Secondary | ICD-10-CM | POA: Insufficient documentation

## 2013-10-18 DIAGNOSIS — D1803 Hemangioma of intra-abdominal structures: Secondary | ICD-10-CM

## 2013-10-18 DIAGNOSIS — K5732 Diverticulitis of large intestine without perforation or abscess without bleeding: Secondary | ICD-10-CM

## 2013-10-18 DIAGNOSIS — M542 Cervicalgia: Secondary | ICD-10-CM | POA: Insufficient documentation

## 2013-10-18 DIAGNOSIS — G5600 Carpal tunnel syndrome, unspecified upper limb: Secondary | ICD-10-CM

## 2013-10-18 NOTE — Progress Notes (Signed)
Pre visit review using our clinic review tool, if applicable. No additional management support is needed unless otherwise documented below in the visit note. 

## 2013-10-18 NOTE — Assessment & Plan Note (Signed)
Stable on CT abdomen.

## 2013-10-18 NOTE — Progress Notes (Signed)
Subjective:    Patient ID: Jocelyn Morgan, female    DOB: 1951-03-12, 63 y.o.   MRN: [id number]  HPI  Jocelyn Morgan 63 yr old female who presents today for follow up of her diverticulitis which was diagnosed on 10/11/13.  She is currently on cipro and flagyl. Denies abdominal pain or bloody stools. Denies fever.  Reports some diarrhea which she attributes to side effect of antibiotics.   She does report some neck pain at night which is non-radiating.  Uses prn ibuprofen with good improvement.  Notes occasional tingling in her fingers (not pinky finger).  Knits and crafts frequently.  Review of Systems See HPI  Past Medical History  Diagnosis Date  . Hyperlipidemia   . Arthritis     osteoarthritis  . Postmenopausal HRT (hormone replacement therapy)   . Liver hemangioma 03/16/2011    History   Social History  . Marital Status: Married    Spouse Name: N/A    Number of Children: 4  . Years of Education: N/A   Occupational History  . housewife    Social History Main Topics  . Smoking status: Never Smoker   . Smokeless tobacco: Never Used  . Alcohol Use: 0.6 oz/week    1 Glasses of wine per week  . Drug Use: No  . Sexual Activity: Yes   Other Topics Concern  . Not on file   Social History Narrative   Exercise: sometimes   Caffeine: 2 cups 1/2 and 1/2 coffee daily          Past Surgical History  Procedure Laterality Date  . Tubal ligation    . Tubal ligation      Family History  Problem Relation Age of Onset  . Cancer Mother 40    breast  . Arthritis Other   . Diabetes Other   . Cancer Other     bladder  . Ulcerative colitis Brother     No Known Allergies  Current Outpatient Prescriptions on File Prior to Visit  Medication Sig Dispense Refill  . aspirin 81 MG EC tablet Take 81 mg by mouth daily.        . ciprofloxacin (CIPRO) 500 MG tablet Take 1 tablet (500 mg total) by mouth 2 (two) times daily.  20 tablet  0  . ibuprofen (ADVIL,MOTRIN) 200 MG  tablet Take 400 mg by mouth every 6 (six) hours as needed. pain       . metroNIDAZOLE (FLAGYL) 500 MG tablet Take 1 tablet (500 mg total) by mouth 2 (two) times daily.  20 tablet  0  . simvastatin (ZOCOR) 10 MG tablet Take 1 tablet (10 mg total) by mouth at bedtime.  30 tablet  6  . Calcium-Vitamin D 500-125 MG-UNIT TABS Take 1 tablet by mouth daily.        . Misc Natural Products (OSTEO BI-FLEX ADV TRIPLE ST) TABS Take 1 tablet by mouth daily.        . Multiple Vitamin (MULTIVITAMIN) tablet Take 1 tablet by mouth daily.        . Omega-3 Fatty Acids (FISH OIL) 1200 MG CAPS Take 1 capsule by mouth daily.         No current facility-administered medications on file prior to visit.    BP 120/70  Pulse 83  Temp(Src) 98.1 F (36.7 C) (Oral)  Resp 16  Ht 5\' 2"  (1.575 m)  Wt 139 lb 1.9 oz (63.104 kg)  BMI 25.44 kg/m2  SpO2 99%  LMP 07/25/1998       Objective:   Physical Exam  Constitutional: She is oriented to person, place, and time. She appears well-developed and well-nourished. No distress.  Cardiovascular: Normal rate and regular rhythm.   No murmur heard. Pulmonary/Chest: Effort normal and breath sounds normal. No respiratory distress. She has no wheezes. She has no rales. She exhibits no tenderness.  Abdominal: Soft. Bowel sounds are normal. She exhibits no distension. There is no tenderness. There is no rebound.  Musculoskeletal: She exhibits no edema.  Neurological: She is alert and oriented to person, place, and time.  Neg tinels, mildly positive phalans Bilateral UE/LE strength is 5/5  Psychiatric: She has a normal mood and affect. Her behavior is normal. Judgment and thought content normal.          Assessment & Plan:

## 2013-10-18 NOTE — Patient Instructions (Signed)
Please follow up in 6 months for physical

## 2013-10-18 NOTE — Assessment & Plan Note (Signed)
Resolved.  Complete abx.

## 2013-10-18 NOTE — Assessment & Plan Note (Signed)
Mild, monitor with prn advil.

## 2013-10-18 NOTE — Assessment & Plan Note (Signed)
Intermittent, non-radiating without associated weakness. Likely arthritis. We discussed prn tylenol or advil (sparingly).

## 2013-10-21 ENCOUNTER — Telehealth: Payer: Self-pay | Admitting: *Deleted

## 2013-10-21 DIAGNOSIS — R3129 Other microscopic hematuria: Secondary | ICD-10-CM

## 2013-10-21 NOTE — Telephone Encounter (Signed)
Message copied by Ronny Flurry on Mon Oct 21, 2013  3:44 PM ------      Message from: O'SULLIVAN, MELISSA      Created: Fri Oct 18, 2013  7:46 AM       Pt will return in 1 month for UA, dx microscopic hematuria. tks ------

## 2013-10-21 NOTE — Telephone Encounter (Signed)
Lab order entered.

## 2013-11-04 ENCOUNTER — Telehealth: Payer: Self-pay | Admitting: Family

## 2013-11-04 MED ORDER — SIMVASTATIN 10 MG PO TABS: 10.0000 mg | ORAL_TABLET | Freq: Every day | ORAL | Status: DC

## 2013-11-04 NOTE — Telephone Encounter (Signed)
Refill simvastatin 

## 2013-11-14 LAB — URINALYSIS, ROUTINE W REFLEX MICROSCOPIC
Bilirubin Urine: NEGATIVE
Glucose, UA: NEGATIVE mg/dL
Hgb urine dipstick: NEGATIVE
Ketones, ur: NEGATIVE mg/dL
LEUKOCYTES UA: NEGATIVE
Nitrite: NEGATIVE
PROTEIN: NEGATIVE mg/dL
Specific Gravity, Urine: 1.01 (ref 1.005–1.030)
UROBILINOGEN UA: 0.2 mg/dL (ref 0.0–1.0)
pH: 6 (ref 5.0–8.0)

## 2013-11-15 ENCOUNTER — Encounter: Payer: Self-pay | Admitting: Family

## 2014-04-23 ENCOUNTER — Telehealth: Payer: Self-pay | Admitting: Family

## 2014-04-23 ENCOUNTER — Encounter: Payer: Self-pay | Admitting: Family

## 2014-04-23 ENCOUNTER — Other Ambulatory Visit: Payer: Self-pay | Admitting: Family

## 2014-04-23 ENCOUNTER — Ambulatory Visit (INDEPENDENT_AMBULATORY_CARE_PROVIDER_SITE_OTHER): Payer: BC Managed Care – PPO | Admitting: Family

## 2014-04-23 VITALS — BP 131/66 | HR 71 | Temp 98.0°F | Resp 16 | Ht 62.5 in | Wt 142.4 lb

## 2014-04-23 DIAGNOSIS — N952 Postmenopausal atrophic vaginitis: Secondary | ICD-10-CM | POA: Insufficient documentation

## 2014-04-23 DIAGNOSIS — H8110 Benign paroxysmal vertigo, unspecified ear: Secondary | ICD-10-CM

## 2014-04-23 DIAGNOSIS — Z0000 Encounter for general adult medical examination without abnormal findings: Secondary | ICD-10-CM

## 2014-04-23 DIAGNOSIS — Z01419 Encounter for gynecological examination (general) (routine) without abnormal findings: Secondary | ICD-10-CM

## 2014-04-23 DIAGNOSIS — M545 Low back pain, unspecified: Secondary | ICD-10-CM

## 2014-04-23 LAB — BASIC METABOLIC PANEL
BUN: 16 mg/dL (ref 6–23)
CHLORIDE: 106 meq/L (ref 96–112)
CO2: 24 mEq/L (ref 19–32)
Calcium: 9.3 mg/dL (ref 8.4–10.5)
Creatinine, Ser: 0.7 mg/dL (ref 0.4–1.2)
GFR: 89.46 mL/min (ref >60.00)
Glucose, Bld: 93 mg/dL (ref 70–99)
Potassium: 3.7 mEq/L (ref 3.5–5.1)
Sodium: 139 mEq/L (ref 135–145)

## 2014-04-23 LAB — LIPID PANEL
CHOL/HDL RATIO: 4
Cholesterol: 177 mg/dL (ref 0–200)
HDL: 41.9 mg/dL (ref >39.00)
NONHDL: 135.1
TRIGLYCERIDES: 206 mg/dL — AB (ref 0.0–149.0)
VLDL: 41.2 mg/dL — ABNORMAL HIGH (ref 0.0–40.0)

## 2014-04-23 LAB — CBC WITH DIFFERENTIAL/PLATELET
BASOS PCT: 0.6 % (ref 0.0–3.0)
Basophils Absolute: 0 10*3/uL (ref 0.0–0.1)
EOS PCT: 3.6 % (ref 0.0–5.0)
Eosinophils Absolute: 0.2 10*3/uL (ref 0.0–0.7)
HCT: 39.3 % (ref 36.0–46.0)
Hemoglobin: 13.4 g/dL (ref 12.0–15.0)
Lymphocytes Relative: 23.8 % (ref 12.0–46.0)
Lymphs Abs: 1.2 10*3/uL (ref 0.7–4.0)
MCHC: 34.1 g/dL (ref 30.0–36.0)
MCV: 91.6 fl (ref 78.0–100.0)
Monocytes Absolute: 0.3 10*3/uL (ref 0.1–1.0)
Monocytes Relative: 6 % (ref 3.0–12.0)
Neutro Abs: 3.4 10*3/uL (ref 1.4–7.7)
Neutrophils Relative %: 66 % (ref 43.0–77.0)
Platelets: 309 10*3/uL (ref 150.0–400.0)
RBC: 4.3 Mil/uL (ref 3.87–5.11)
RDW: 12.8 % (ref 11.5–15.5)
WBC: 5.1 10*3/uL (ref 4.0–10.5)

## 2014-04-23 LAB — URINALYSIS, ROUTINE W REFLEX MICROSCOPIC
BILIRUBIN URINE: NEGATIVE
Ketones, ur: NEGATIVE
Leukocytes, UA: NEGATIVE
Nitrite: NEGATIVE
Specific Gravity, Urine: 1.01 (ref 1.000–1.030)
TOTAL PROTEIN, URINE-UPE24: NEGATIVE
URINE GLUCOSE: NEGATIVE
Urobilinogen, UA: 0.2 (ref 0.0–1.0)
pH: 7 (ref 5.0–8.0)

## 2014-04-23 LAB — LDL CHOLESTEROL, DIRECT: Direct LDL: 102.3 mg/dL

## 2014-04-23 LAB — HEPATIC FUNCTION PANEL
ALK PHOS: 44 U/L (ref 39–117)
ALT: 14 U/L (ref 0–35)
AST: 19 U/L (ref 0–37)
Albumin: 4.6 g/dL (ref 3.5–5.2)
Bilirubin, Direct: 0 mg/dL (ref 0.0–0.3)
TOTAL PROTEIN: 7.6 g/dL (ref 6.0–8.3)
Total Bilirubin: 0.3 mg/dL (ref 0.2–1.2)

## 2014-04-23 LAB — TSH: TSH: 1.19 u[IU]/mL (ref 0.35–4.50)

## 2014-04-23 LAB — HM PAP SMEAR: HM Pap smear: NORMAL

## 2014-04-23 MED ORDER — SIMVASTATIN 10 MG PO TABS: 10.0000 mg | ORAL_TABLET | Freq: Every day | ORAL | Status: DC

## 2014-04-23 MED ORDER — MELOXICAM 7.5 MG PO TABS: 7.5000 mg | ORAL_TABLET | Freq: Every day | ORAL | Status: DC

## 2014-04-23 MED ORDER — ESTRADIOL 0.1 MG/GM VA CREA: TOPICAL_CREAM | VAGINAL | Status: DC

## 2014-04-23 NOTE — Assessment & Plan Note (Signed)
Symptoms are mild and intermittent. We discussed possibility of referral to vestibular rehab but she wishes to hold off at this time.

## 2014-04-23 NOTE — Telephone Encounter (Signed)
90 day supply simvastatin re-sent to [facility].

## 2014-04-23 NOTE — Assessment & Plan Note (Signed)
I suspect mild bladder prolapse.  She is bothered by vaginal dryness. She would like to start vaginal estrogen again which is reasonable.  I am hopeful that her urinary symptoms will improve with Kegels and estrogen, however if no improvement in bladder issues in 3 months will consider referral to GYN.

## 2014-04-23 NOTE — Patient Instructions (Signed)
Complete lab work prior to leaving. We will send you your lab and pap results via [facility]. Start meloxicam for your back pain and your back exercises.  Let me know if back pain worsens or if back pain does not improve in the next 4-6 weeks. Start Estrace vaginal cream twice weekly.  Let me know if your difficulty emptying your bladder worsens. Try to get 30 minutes of exercise 5 days a week and focus on a healthy diet. Follow up in 3 months.

## 2014-04-23 NOTE — Progress Notes (Signed)
Pre visit review using our clinic review tool, if applicable. No additional management support is needed unless otherwise documented below in the visit note. 

## 2014-04-23 NOTE — Progress Notes (Signed)
Subjective:    Patient ID: Jocelyn Morgan, female    DOB: 07/07/1951, 63 y.o.   MRN: [id number]  HPI  Patient presents today for complete physical.  Immunizations: declines flu shot today. Diet: diet fair Wt Readings from Last 3 Encounters:  04/23/14 142 lb 6.4 oz (64.592 kg)  10/18/13 139 lb 1.9 oz (63.104 kg)  03/29/13 137 lb (62.143 kg)  Exercise: some exercise bike 20 min few times a week Colonoscopy: 2012 Dexa: up to date Pap Smear: 2013 Mammogram: due in November  2 months ago had a "horrible dizzy spell."  Notes that she still has these episodes if she looks up quickly.    Low back pain- right low back. Worse at night, non-radiating. Denies LE weakness.   Vaginal dryness- tried Replens but reports that she "couldn't insert it."  Feels like things have "shifted."  Has been sexually active "a little" over the last few years. Has had pain and bleeding when she has attempted intercourse.  Has been on vaginal estrogen in the past.  Reports that she had one episode a few weeks back when she had trouble emptying her bladder.   She walked around and tried again and was ultimately successful in voiding.     Review of Systems  Constitutional: Negative for unexpected weight change.  HENT: Negative for rhinorrhea.   Respiratory: Negative for shortness of breath.        Mild AM dry coughs  Cardiovascular: Negative for chest pain and leg swelling.  Gastrointestinal: Negative for nausea, vomiting and diarrhea.  Genitourinary:       Some difficulty completely emptying bladder  Musculoskeletal: Positive for back pain.  Skin: Negative for rash.  Neurological: Negative for headaches.  Hematological: Negative for adenopathy.  Psychiatric/Behavioral:       Denies depression/anxiety       Past Medical History  Diagnosis Date  . Hyperlipidemia   . Arthritis     osteoarthritis  . Postmenopausal HRT (hormone replacement therapy)   . Liver hemangioma 03/16/2011    History    Social History  . Marital Status: Married    Spouse Name: N/A    Number of Children: 4  . Years of Education: N/A   Occupational History  . housewife    Social History Main Topics  . Smoking status: Never Smoker   . Smokeless tobacco: Never Used  . Alcohol Use: 0.6 oz/week    1 Glasses of wine per week  . Drug Use: No  . Sexual Activity: Yes   Other Topics Concern  . Not on file   Social History Narrative   Exercise: sometimes   Caffeine: 2 cups 1/2 and 1/2 coffee daily   5 grandchildren (none of her children are in [place])   Married             Past Surgical History  Procedure Laterality Date  . Tubal ligation    . Tubal ligation      Family History  Problem Relation Age of Onset  . Cancer Mother 25    breast  . Arthritis Other   . Diabetes Other   . Cancer Other     bladder  . Ulcerative colitis Brother     No Known Allergies  Current Outpatient Prescriptions on File Prior to Visit  Medication Sig Dispense Refill  . aspirin 81 MG EC tablet Take 81 mg by mouth daily.        . Calcium-Vitamin D 500-125 MG-UNIT TABS Take 1  tablet by mouth daily.        . metroNIDAZOLE (FLAGYL) 500 MG tablet Take 1 tablet (500 mg total) by mouth 2 (two) times daily.  20 tablet  0  . Misc Natural Products (OSTEO BI-FLEX ADV TRIPLE ST) TABS Take 1 tablet by mouth daily.        . Multiple Vitamin (MULTIVITAMIN) tablet Take 1 tablet by mouth daily.        . Omega-3 Fatty Acids (FISH OIL) 1200 MG CAPS Take 1 capsule by mouth daily.        . simvastatin (ZOCOR) 10 MG tablet Take 1 tablet (10 mg total) by mouth at bedtime.  30 tablet  5   No current facility-administered medications on file prior to visit.    BP 131/66  Pulse 71  Temp(Src) 98 F (36.7 C) (Oral)  Resp 16  Ht 5' 2.5" (1.588 m)  Wt 142 lb 6.4 oz (64.592 kg)  BMI 25.61 kg/m2  SpO2 99%  LMP 07/25/1998    Objective:   Physical Exam  Physical Exam  Constitutional: She is oriented to person, place, and  time. She appears well-developed and well-nourished. No distress.  HENT:  Head: Normocephalic and atraumatic.  Right Ear: Tympanic membrane and ear canal normal.  Left Ear: Tympanic membrane and ear canal normal.  Mouth/Throat: Oropharynx is clear and moist.  Eyes: Pupils are equal, round, and reactive to light. No scleral icterus.  Neck: Normal range of motion. No thyromegaly present.  Cardiovascular: Normal rate and regular rhythm.   No murmur heard. Pulmonary/Chest: Effort normal and breath sounds normal. No respiratory distress. He has no wheezes. She has no rales. She exhibits no tenderness.  Abdominal: Soft. Bowel sounds are normal. He exhibits no distension and no mass. There is no tenderness. There is no rebound and no guarding.  Musculoskeletal: She exhibits no edema.  Lymphadenopathy:    She has no cervical adenopathy.  Neurological: She is alert and oriented to person, place, and time. She has normal patellar reflexes. She exhibits normal muscle tone. Coordination normal.  Skin: Skin is warm and dry.  Psychiatric: She has a normal mood and affect. Her behavior is normal. Judgment and thought content normal.  Breasts: Examined lying Right: Without masses, retractions, discharge or axillary adenopathy.  Left: Without masses, retractions, discharge or axillary adenopathy.  Inguinal/mons: Normal without inguinal adenopathy  External genitalia: Normal  BUS/Urethra/Skene's glands: Normal  Bladder: grade I-2 bladder prolapse is noted. Vagina: atrophic Cervix: Normal  Uterus: normal in size, shape and contour. Midline and mobile  Adnexa/parametria:  Rt: Without masses or tenderness.  Lt: Without masses or tenderness.  Anus and perineum: Normal           Assessment & Plan:         Assessment & Plan:

## 2014-04-23 NOTE — Telephone Encounter (Signed)
Caller name: cindy- harris teeter Relation to pt: Call back number: [phone] Pharmacy:  Reason for call:   Patient is requesting 90 day supply

## 2014-04-23 NOTE — Assessment & Plan Note (Signed)
Increase exercie, pap today, fasting labs

## 2014-04-23 NOTE — Assessment & Plan Note (Signed)
Has done PT in the past and has the home exercises at home on file. Has not been doing these exercises. I advised pt to start short course of meloxicam, start exercises again. Follow up if symptoms worsen or if not improved in 4-6 weeks. Could consider MRI at that time.

## 2014-04-24 ENCOUNTER — Encounter: Payer: Self-pay | Admitting: Family

## 2014-04-24 ENCOUNTER — Other Ambulatory Visit: Payer: Self-pay | Admitting: Family

## 2014-04-24 LAB — PAP IG (IMAGE GUIDED): COMMENTS:: 1

## 2014-04-24 MED ORDER — FISH OIL 1000 MG PO CAPS: 2000.0000 mg | ORAL_CAPSULE | Freq: Two times a day (BID) | ORAL | Status: DC

## 2014-04-25 ENCOUNTER — Encounter: Payer: Self-pay | Admitting: Family

## 2014-05-07 ENCOUNTER — Other Ambulatory Visit: Payer: Self-pay | Admitting: Family

## 2014-05-07 NOTE — Telephone Encounter (Signed)
Medication Detail      Disp Refills Start End     simvastatin (ZOCOR) 10 MG tablet 90 tablet 1 04/23/2014 04/23/2015    Sig - Route: Take 1 tablet (10 mg total) by mouth at bedtime. - Oral    E-Prescribing Status: Receipt confirmed by pharmacy (04/23/2014 2:10 PM EDT)    [place], [place] [facility] [place] [place] [address]

## 2014-05-12 ENCOUNTER — Other Ambulatory Visit: Payer: Self-pay | Admitting: Family

## 2014-05-12 DIAGNOSIS — Z1231 Encounter for screening mammogram for malignant neoplasm of breast: Secondary | ICD-10-CM

## 2014-06-06 ENCOUNTER — Ambulatory Visit (HOSPITAL_BASED_OUTPATIENT_CLINIC_OR_DEPARTMENT_OTHER)
Admission: RE | Admit: 2014-06-06 | Discharge: 2014-06-06 | Disposition: A | Discharge status: Home / Self Care | Payer: BC Managed Care – PPO | Source: Ambulatory Visit | Attending: Family | Admitting: Family

## 2014-06-06 DIAGNOSIS — Z1231 Encounter for screening mammogram for malignant neoplasm of breast: Secondary | ICD-10-CM | POA: Insufficient documentation

## 2014-06-16 ENCOUNTER — Encounter: Payer: Self-pay | Admitting: Family

## 2014-07-29 ENCOUNTER — Ambulatory Visit: Payer: BC Managed Care – PPO | Admitting: Family

## 2014-08-09 ENCOUNTER — Encounter: Payer: Self-pay | Admitting: Family

## 2014-08-19 ENCOUNTER — Ambulatory Visit (INDEPENDENT_AMBULATORY_CARE_PROVIDER_SITE_OTHER): Payer: 59 | Admitting: Family

## 2014-08-19 ENCOUNTER — Encounter: Payer: Self-pay | Admitting: Family

## 2014-08-19 VITALS — BP 100/68 | HR 91 | Temp 98.4°F | Resp 14 | Ht 62.5 in | Wt 145.0 lb

## 2014-08-19 DIAGNOSIS — M25552 Pain in left hip: Secondary | ICD-10-CM

## 2014-08-19 DIAGNOSIS — E785 Hyperlipidemia, unspecified: Secondary | ICD-10-CM

## 2014-08-19 DIAGNOSIS — M25551 Pain in right hip: Secondary | ICD-10-CM | POA: Insufficient documentation

## 2014-08-19 DIAGNOSIS — M545 Low back pain, unspecified: Secondary | ICD-10-CM

## 2014-08-19 DIAGNOSIS — N952 Postmenopausal atrophic vaginitis: Secondary | ICD-10-CM

## 2014-08-19 MED ORDER — PRAVASTATIN SODIUM 20 MG PO TABS: 20.0000 mg | ORAL_TABLET | Freq: Every day | ORAL | Status: DC

## 2014-08-19 NOTE — Patient Instructions (Addendum)
Start pravastatin, call if worsening hip/joint pain after starting. Schedule follow up cholesterol in 3 months. Try starting water aerobics for back/hip pain.  Follow up in 6 months.

## 2014-08-19 NOTE — Assessment & Plan Note (Signed)
Patient has bilateral hip pian likely due to OA. Stopped statin 10 days ago and pain has improved somewhat. Will change to pravastatin and see how she responds. Monitor.

## 2014-08-19 NOTE — Assessment & Plan Note (Signed)
Back pain has not improved. Discussed PT and she says she would like to think about this. Encouraged water exercise. Instructed her to use acetaminophen for mild pain and reserve ibuprofen for severe pain and use sparingly.

## 2014-08-19 NOTE — Progress Notes (Signed)
Pre visit review using our clinic review tool, if applicable. No additional management support is needed unless otherwise documented below in the visit note. 

## 2014-08-19 NOTE — Assessment & Plan Note (Addendum)
Vaginal dryness has improved with estrogen cream. Also reports difficulty with voiding has improved and night time voiding is reduced. Continue same.

## 2014-08-19 NOTE — Progress Notes (Signed)
Subjective:    Patient ID: Jocelyn Morgan, female    DOB: 13-Oct-1950, 64 y.o.   MRN: [id number]  HPI Ms. Skyles is here today for follow up for atrophic vaginitis and trouble emptying her bladder.  Atrophic vaginitis: vagina feels more lubricated. Has not had sexual intercourse since her last visit.  Trouble emptying bladder. She has ben doing Kegels and reports that her bladder emptying is improved and and she is voiding less at night.  Low back pain- still reporting low right back pain without radiation to legs.and it has not improved.   Hip pain - Has had bilateral hip pain (7/10) for the last three weeks left > right with trouble getting upstairs and pain that wakes her at night from sleeping on her side- left > right. She stopped simvastatin about 10 days ago and hip pian has improved.(3/10).  Has tried going off of simvastatin in past and has always ends up resuming statin due to lipids not being at goal. Lab Results  Component Value Date   CHOL 177 04/23/2014   HDL 41.90 04/23/2014   LDLCALC 92 09/30/2013   LDLDIRECT 102.3 04/23/2014   TRIG 206.0* 04/23/2014   CHOLHDL 4 04/23/2014   Review of Systems  Genitourinary: Negative for vaginal bleeding and vaginal pain.   Past Medical History  Diagnosis Date  . Hyperlipidemia   . Arthritis     osteoarthritis  . Postmenopausal HRT (hormone replacement therapy)   . Liver hemangioma 03/16/2011    History   Social History  . Marital Status: Married    Spouse Name: N/A    Number of Children: 4  . Years of Education: N/A   Occupational History  . housewife    Social History Main Topics  . Smoking status: Never Smoker   . Smokeless tobacco: Never Used  . Alcohol Use: 0.6 oz/week    1 Glasses of wine per week  . Drug Use: No  . Sexual Activity: Yes   Other Topics Concern  . Not on file   Social History Narrative   Exercise: sometimes   Caffeine: 2 cups 1/2 and 1/2 coffee daily   5 grandchildren (none of her  children are in [place])   Married             Past Surgical History  Procedure Laterality Date  . Tubal ligation    . Tubal ligation      Family History  Problem Relation Age of Onset  . Cancer Mother 49    breast  . Arthritis Other   . Diabetes Other   . Cancer Other     bladder  . Ulcerative colitis Brother     No Known Allergies  Current Outpatient Prescriptions on File Prior to Visit  Medication Sig Dispense Refill  . aspirin 81 MG EC tablet Take 81 mg by mouth daily.      . Calcium-Vitamin D 500-125 MG-UNIT TABS Take 1 tablet by mouth daily.      Marland Kitchen estradiol (ESTRACE VAGINAL) 0.1 MG/GM vaginal cream One gram PV twice weekly at bedtime 42.5 g 5  . meloxicam (MOBIC) 7.5 MG tablet Take 1 tablet (7.5 mg total) by mouth daily. (Patient taking differently: Take 7.5 mg by mouth daily as needed. ) 14 tablet 0  . Misc Natural Products (OSTEO BI-FLEX ADV TRIPLE ST) TABS Take 1 tablet by mouth daily.      . Multiple Vitamin (MULTIVITAMIN) tablet Take 1 tablet by mouth daily.      Marland Kitchen  Omega-3 Fatty Acids (FISH OIL) 1000 MG CAPS Take 2 capsules (2,000 mg total) by mouth 2 (two) times daily.  0   No current facility-administered medications on file prior to visit.    BP 100/68 mmHg  Pulse 91  Temp(Src) 98.4 F (36.9 C) (Oral)  Resp 14  Ht 5' 2.5" (1.588 m)  Wt 145 lb (65.772 kg)  BMI 26.08 kg/m2  SpO2 98%  LMP 07/25/1998       Objective:   Physical Exam  Constitutional: She is oriented to person, place, and time. She appears well-developed and well-nourished. No distress.  HENT:  Head: Normocephalic and atraumatic.  Cardiovascular: Normal rate, regular rhythm and normal heart sounds.  Exam reveals no gallop and no friction rub.   No murmur heard. Pulmonary/Chest: Effort normal and breath sounds normal. No respiratory distress. She has no wheezes. She has no rales.  Lymphadenopathy:    She has no cervical adenopathy.  Neurological: She is alert and oriented to person,  place, and time.  Skin: Skin is warm and dry. She is not diaphoretic.  Psychiatric: She has a normal mood and affect. Her behavior is normal. Judgment and thought content normal.          Assessment & Plan:  Patient seen along with [facility] NP-student.  I have personally seen and examined patient and agree with Ms. Derak Schurman's assessment and plan- continue estrogen cream, monitor back pain, trial of pravastatin to see if she can tolerate.  Debbrah Alar NP

## 2014-11-21 ENCOUNTER — Other Ambulatory Visit (INDEPENDENT_AMBULATORY_CARE_PROVIDER_SITE_OTHER): Payer: 59

## 2014-11-21 ENCOUNTER — Encounter: Payer: Self-pay | Admitting: Family

## 2014-11-21 ENCOUNTER — Other Ambulatory Visit: Payer: Self-pay | Admitting: Family

## 2014-11-21 DIAGNOSIS — E785 Hyperlipidemia, unspecified: Secondary | ICD-10-CM | POA: Diagnosis not present

## 2014-11-21 LAB — LIPID PANEL
CHOL/HDL RATIO: 4
Cholesterol: 157 mg/dL (ref 0–200)
HDL: 39.5 mg/dL (ref >39.00)
LDL Cholesterol: 89 mg/dL (ref 0–99)
NonHDL: 117.5
Triglycerides: 145 mg/dL (ref 0.0–149.0)
VLDL: 29 mg/dL (ref 0.0–40.0)

## 2014-11-21 MED ORDER — PRAVASTATIN SODIUM 20 MG PO TABS: 20.0000 mg | ORAL_TABLET | Freq: Every day | ORAL | Status: DC

## 2015-01-29 DIAGNOSIS — H524 Presbyopia: Secondary | ICD-10-CM | POA: Diagnosis not present

## 2015-02-11 ENCOUNTER — Ambulatory Visit (HOSPITAL_BASED_OUTPATIENT_CLINIC_OR_DEPARTMENT_OTHER)
Admission: RE | Admit: 2015-02-11 | Discharge: 2015-02-11 | Disposition: A | Discharge status: Home / Self Care | Payer: Medicare Other | Source: Ambulatory Visit | Attending: Family | Admitting: Family

## 2015-02-11 ENCOUNTER — Ambulatory Visit (INDEPENDENT_AMBULATORY_CARE_PROVIDER_SITE_OTHER): Payer: Medicare Other | Admitting: Family

## 2015-02-11 ENCOUNTER — Encounter: Payer: Self-pay | Admitting: Family

## 2015-02-11 VITALS — BP 126/82 | HR 87 | Temp 97.8°F | Resp 16 | Ht 62.5 in | Wt 145.8 lb

## 2015-02-11 DIAGNOSIS — M25552 Pain in left hip: Secondary | ICD-10-CM

## 2015-02-11 DIAGNOSIS — M25559 Pain in unspecified hip: Secondary | ICD-10-CM | POA: Diagnosis not present

## 2015-02-11 DIAGNOSIS — M25551 Pain in right hip: Secondary | ICD-10-CM | POA: Diagnosis not present

## 2015-02-11 DIAGNOSIS — M1612 Unilateral primary osteoarthritis, left hip: Secondary | ICD-10-CM | POA: Diagnosis not present

## 2015-02-11 DIAGNOSIS — M1611 Unilateral primary osteoarthritis, right hip: Secondary | ICD-10-CM | POA: Diagnosis not present

## 2015-02-11 MED ORDER — MELOXICAM 7.5 MG PO TABS: 7.5000 mg | ORAL_TABLET | Freq: Every day | ORAL | Status: DC | PRN

## 2015-02-11 NOTE — Progress Notes (Signed)
Pre visit review using our clinic review tool, if applicable. No additional management support is needed unless otherwise documented below in the visit note. 

## 2015-02-11 NOTE — Progress Notes (Signed)
Subjective:    Patient ID: Jocelyn Morgan, female    DOB: August 07, 1950, 64 y.o.   MRN: [id number]  HPI  [name] is a 64 yr old female who presents today with chief complaint of bilateral hip pain. Pain has been present intermittently for > 6 months. We discussed her hip pain back in January of this year and she felt that her pain had improved some off of statin. We changed her to pravastatin.  Pain is worse with stairs.  Reports L hip pain is worse than the right.  Did not feel that the pain worsened with the statin.  She is not exercising.   Wt Readings from Last 3 Encounters:  02/11/15 145 lb 12.8 oz (66.134 kg)  08/19/14 145 lb (65.772 kg)  04/23/14 142 lb 6.4 oz (64.592 kg)      Review of Systems    see HPI  Past Medical History  Diagnosis Date  . Hyperlipidemia   . Arthritis     osteoarthritis  . Postmenopausal HRT (hormone replacement therapy)   . Liver hemangioma 03/16/2011    History   Social History  . Marital Status: Married    Spouse Name: N/A  . Number of Children: 4  . Years of Education: N/A   Occupational History  . housewife    Social History Main Topics  . Smoking status: Never Smoker   . Smokeless tobacco: Never Used  . Alcohol Use: 0.6 oz/week    1 Glasses of wine per week  . Drug Use: No  . Sexual Activity: Yes   Other Topics Concern  . Not on file   Social History Narrative   Exercise: sometimes   Caffeine: 2 cups 1/2 and 1/2 coffee daily   5 grandchildren (none of her children are in [place])   Married             Past Surgical History  Procedure Laterality Date  . Tubal ligation    . Tubal ligation      Family History  Problem Relation Age of Onset  . Cancer Mother 43    breast  . Arthritis Other   . Diabetes Other   . Cancer Other     bladder  . Ulcerative colitis Brother     No Known Allergies  Current Outpatient Prescriptions on File Prior to Visit  Medication Sig Dispense Refill  . aspirin 81 MG EC tablet  Take 81 mg by mouth daily.      . Calcium-Vitamin D 500-125 MG-UNIT TABS Take 1 tablet by mouth daily.      Marland Kitchen estradiol (ESTRACE VAGINAL) 0.1 MG/GM vaginal cream One gram PV twice weekly at bedtime 42.5 g 5  . Misc Natural Products (OSTEO BI-FLEX ADV TRIPLE ST) TABS Take 1 tablet by mouth daily.      . Multiple Vitamin (MULTIVITAMIN) tablet Take 1 tablet by mouth daily.      . Omega-3 Fatty Acids (FISH OIL) 1000 MG CAPS Take 2 capsules (2,000 mg total) by mouth 2 (two) times daily.  0  . pravastatin (PRAVACHOL) 20 MG tablet Take 1 tablet (20 mg total) by mouth daily. 90 tablet 1   No current facility-administered medications on file prior to visit.    BP 126/82 mmHg  Pulse 87  Temp(Src) 97.8 F (36.6 C) (Oral)  Resp 16  Ht 5' 2.5" (1.588 m)  Wt 145 lb 12.8 oz (66.134 kg)  BMI 26.23 kg/m2  SpO2 99%  LMP 07/25/1998  Objective:   Physical Exam  Constitutional: She is oriented to person, place, and time. She appears well-developed and well-nourished. No distress.  HENT:  Head: Normocephalic and atraumatic.  Cardiovascular: Normal rate and regular rhythm.   No murmur heard. Pulmonary/Chest: Effort normal and breath sounds normal. No respiratory distress. She has no wheezes. She has no rales. She exhibits no tenderness.  Musculoskeletal:  Bilateral hips without swelling or tenderness.  Some discomfort right hip with flexion  Neurological: She is alert and oriented to person, place, and time.  Psychiatric: She has a normal mood and affect. Her behavior is normal. Judgment and thought content normal.          Assessment & Plan:

## 2015-02-11 NOTE — Assessment & Plan Note (Signed)
Suspect OA. Trial of meloxicam, obtain bilateral hip x rays and refer to sports medicine.

## 2015-02-11 NOTE — Patient Instructions (Addendum)
You will be contacted about your referral to sports medicine. You may use meloxicam once a daily as needed. Please complete hip x rays on the first floor. Follow up in September for your annual wellness visit.

## 2015-02-12 ENCOUNTER — Encounter: Payer: Self-pay | Admitting: Family Medicine

## 2015-02-12 ENCOUNTER — Ambulatory Visit (INDEPENDENT_AMBULATORY_CARE_PROVIDER_SITE_OTHER): Payer: Medicare Other | Admitting: Family Medicine

## 2015-02-12 ENCOUNTER — Telehealth: Payer: Self-pay | Admitting: Family

## 2015-02-12 VITALS — BP 134/90 | HR 101 | Ht 63.0 in | Wt 145.0 lb

## 2015-02-12 DIAGNOSIS — M25552 Pain in left hip: Secondary | ICD-10-CM

## 2015-02-12 DIAGNOSIS — M25551 Pain in right hip: Secondary | ICD-10-CM

## 2015-02-12 NOTE — Patient Instructions (Signed)
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 Avoid painful activities as much as possible (try to keep pain level at a 3/10 level or less). Ice over area of pain 3-4 times a day for 15 minutes at a time Hip side raise exercise, standing hip rotation 3 sets of 10-15 once a day - add weights if this becomes too easy. Stretches - pick 2 and hold for 20-30 seconds x 3 - do once or twice a day. Tylenol and/or meloxicam as needed for pain. If not improving, can consider physical therapy and/or steroid injection - call me if you want to do either of these. Follow up in 6 weeks otherwise.

## 2015-02-12 NOTE — Telephone Encounter (Signed)
Please let pt know that x ray shows arthritis.  Note is made of curvature of her lower spine.  I would recommend MRI of this area to further evaluate the nerves and make sure that there is no nerve pinching which could be contributing to this area.

## 2015-02-16 ENCOUNTER — Encounter: Payer: Self-pay | Admitting: Family

## 2015-02-16 NOTE — Telephone Encounter (Signed)
Left message on cell advising pt to check mychart message.

## 2015-02-17 NOTE — Progress Notes (Signed)
PCP: Nance Pear., NP  Subjective:   HPI: Patient is a 64 y.o. female here for bilateral hip pain.  Patient denies known injury or trauma. She states she's had pain in both hips for about 6 months. Pain worse in right than left currently but both about the same at 6/10 level of pain at their worse. Feels pain laterally with pain lying on either side. Worse with stairs. No numbness/tingling. No bowel/bladder dysfunction.  Past Medical History  Diagnosis Date  . Hyperlipidemia   . Arthritis     osteoarthritis  . Postmenopausal HRT (hormone replacement therapy)   . Liver hemangioma 03/16/2011    Current Outpatient Prescriptions on File Prior to Visit  Medication Sig Dispense Refill  . aspirin 81 MG EC tablet Take 81 mg by mouth daily.      . Calcium-Vitamin D 500-125 MG-UNIT TABS Take 1 tablet by mouth daily.      Marland Kitchen estradiol (ESTRACE VAGINAL) 0.1 MG/GM vaginal cream One gram PV twice weekly at bedtime 42.5 g 5  . meloxicam (MOBIC) 7.5 MG tablet Take 1 tablet (7.5 mg total) by mouth daily as needed for pain. 30 tablet 0  . Misc Natural Products (OSTEO BI-FLEX ADV TRIPLE ST) TABS Take 1 tablet by mouth daily.      . Multiple Vitamin (MULTIVITAMIN) tablet Take 1 tablet by mouth daily.      . Omega-3 Fatty Acids (FISH OIL) 1000 MG CAPS Take 2 capsules (2,000 mg total) by mouth 2 (two) times daily.  0  . pravastatin (PRAVACHOL) 20 MG tablet Take 1 tablet (20 mg total) by mouth daily. 90 tablet 1   No current facility-administered medications on file prior to visit.    Past Surgical History  Procedure Laterality Date  . Tubal ligation    . Tubal ligation      No Known Allergies  History   Social History  . Marital Status: Married    Spouse Name: N/A  . Number of Children: 4  . Years of Education: N/A   Occupational History  . housewife    Social History Main Topics  . Smoking status: Never Smoker   . Smokeless tobacco: Never Used  . Alcohol Use: 0.6 oz/week     1 Glasses of wine per week  . Drug Use: No  . Sexual Activity: Yes   Other Topics Concern  . Not on file   Social History Narrative   Exercise: sometimes   Caffeine: 2 cups 1/2 and 1/2 coffee daily   5 grandchildren (none of her children are in [place])   Married             Family History  Problem Relation Age of Onset  . Cancer Mother 12    breast  . Arthritis Other   . Diabetes Other   . Cancer Other     bladder  . Ulcerative colitis Brother     BP 134/90 mmHg  Pulse 101  Ht 5\' 3"  (1.6 m)  Wt 145 lb (65.772 kg)  BMI 25.69 kg/m2  LMP 07/25/1998  Review of Systems: See HPI above.    Objective:  Physical Exam:  Gen: NAD  Bilateral hips/back: No gross deformity, scoliosis. TTP bilateral greater trochanters and right SI joint, posterior to both trochanters.  No midline or bony TTP. FROM. Strength LEs 5/5 all muscle groups except 5-/5 hip abduction bilaterally.   2+ MSRs in patellar and achilles tendons, equal bilaterally. Negative SLRs. Sensation intact to light touch bilaterally. Negative  logroll bilateral hips Positive right fabers with poor flexibility, negative left, negative bilateral piriformis stretches.    Assessment & Plan:  1. Bilateral hip pain - primarily due to trochanteric bursitis but also has right SI joint dysfunction, secondary spasms.  Shown home exercises and stretches to do daily.  Icing as needed.  Discussed injections, physical therapy though she declined at this time. F/u in 6 weeks.

## 2015-02-17 NOTE — Assessment & Plan Note (Signed)
primarily due to trochanteric bursitis but also has right SI joint dysfunction, secondary spasms.  Shown home exercises and stretches to do daily.  Icing as needed.  Discussed injections, physical therapy though she declined at this time. F/u in 6 weeks.

## 2015-03-09 ENCOUNTER — Other Ambulatory Visit: Payer: Self-pay | Admitting: Family

## 2015-03-09 NOTE — Telephone Encounter (Signed)
Pt has f/u 04/2015, Please advise below Rx:  Name from pharmacy:  In chart as:  MELOXICAM 7.5MG  TAB meloxicam (MOBIC) 7.5 MG tablet     Sig: TAKE 1 TABLET (7.5 MG TOTAL) BY MOUTH DAILY AS NEEDED FOR PAIN.    Dispense: 30 tablet   Start: 03/09/2015   Class: Normal    Notes to pharmacy: CYCLE FILL MEDICATION. Authorization is required for next refill.    Requested on: 02/11/2015    Originally ordered on: 02/11/2015 02/11/2015

## 2015-03-12 ENCOUNTER — Encounter: Payer: Self-pay | Admitting: Family Medicine

## 2015-03-16 ENCOUNTER — Telehealth: Payer: Self-pay | Admitting: Family Medicine

## 2015-03-17 NOTE — Telephone Encounter (Signed)
Patient had emailed that they would like to do physical therapy. Emailed and spoke to patient on 03-16-15. Told patient that I would be glad to refer her to physical therapy. Referral sent.

## 2015-03-17 NOTE — Addendum Note (Signed)
Addended by: Sherrie George F on: 03/17/2015 03:42 PM   Modules accepted: Orders

## 2015-03-23 ENCOUNTER — Ambulatory Visit: Payer: Medicare Other | Attending: Family Medicine | Admitting: Rehabilitation

## 2015-03-23 ENCOUNTER — Encounter: Payer: Self-pay | Admitting: Rehabilitation

## 2015-03-23 DIAGNOSIS — M533 Sacrococcygeal disorders, not elsewhere classified: Secondary | ICD-10-CM | POA: Insufficient documentation

## 2015-03-23 DIAGNOSIS — M25551 Pain in right hip: Secondary | ICD-10-CM | POA: Diagnosis not present

## 2015-03-23 DIAGNOSIS — M25552 Pain in left hip: Secondary | ICD-10-CM | POA: Diagnosis not present

## 2015-03-23 NOTE — Therapy (Addendum)
[facility] [address]  [place], [place], [address] Phone: [phone]   Fax:  [phone]  Physical Therapy Evaluation  Patient Details  Name: Jocelyn Morgan MRN: [id number] Date of Birth: [birth year] Referring Provider:  Dene Gentry, MD  Encounter Date: 03/23/2015      PT End of Session - 03/23/15 0956    Visit Number 1   Number of Visits 8   Date for PT Re-Evaluation 04/20/15   PT Start Time 0845   PT Stop Time 0940   PT Time Calculation (min) 55 min   Activity Tolerance Patient tolerated treatment well      Past Medical History  Diagnosis Date  . Hyperlipidemia   . Arthritis     osteoarthritis  . Postmenopausal HRT (hormone replacement therapy)   . Liver hemangioma 03/16/2011    Past Surgical History  Procedure Laterality Date  . Tubal ligation    . Tubal ligation      There were no vitals filed for this visit.  Visit Diagnosis:  Hip pain, right - Plan: PT plan of care cert/re-cert  Hip pain, left - Plan: PT plan of care cert/re-cert  Sacral back pain - Plan: PT plan of care cert/re-cert      Subjective Assessment - 03/23/15 0847    Subjective pt presents with bilateral lateral hip pain and more worrisome being R sided low back pain. the biggest complaint is going up the stairs but not down.  pain is not present much in the morning and worsens as the day goes on.  Sleeping on either side is very painful   Pertinent History has had LBP in the past, bilateral knee pain   Limitations Other (comment)  stairs   Diagnostic tests xrays showing mild OA in the hips and a significant curvature in the low back.     Patient Stated Goals improve stairs, decrease pain   Currently in Pain? Yes   Pain Score 1   varies from 0-6   Pain Location Back  R sided   Pain Orientation Right   Pain Descriptors / Indicators Burning   Pain Onset More than a month ago   Pain Frequency Constant    Aggravating Factors  stairs, sleeping,    Pain Relieving Factors medication, lying in hooklying with performing a posterior pelvic tilt   Multiple Pain Sites Yes            [facility] PT Assessment - 03/23/15 0001    Assessment   Medical Diagnosis bilateral trochanteric bursitis , R sided LBP   Next MD Visit 4-6 weeks   Precautions   Precautions None   Restrictions   Weight Bearing Restrictions No   Balance Screen   Has the patient fallen in the past 6 months No   [place] residence   Prior Function   Level of Independence Independent   Observation/Other Assessments   Focus on Therapeutic Outcomes (FOTO)  39% limited   Functional Tests   Functional tests Single leg stance;Single Leg Squat;Squat;Step up;Step down   Squat   Comments shifts weight to the R leg, knees over toes, and foward lean   Step Up   Comments pain in R piriformis    Single Leg Squat   Comments with 2 poles; significant valgus and adduction moment with knees over toes bilateral   Single Leg Stance   Comments hip drop with stance on R  ROM / Strength   AROM / PROM / Strength PROM;Strength   PROM   Overall PROM Comments decreased ER bilaterally   Strength   Strength Assessment Site Hip;Knee   Right/Left Hip Right;Left   Right Hip Flexion 3+/5   Right Hip Extension 4-/5   Right Hip External Rotation  --  4-/5   Right Hip ABduction 4-/5   Left Hip Flexion 3+/5   Left Hip Extension 4-/5   Left Hip External Rotation  --  4-/5   Left Hip ABduction 4-/5   Right/Left Knee Right;Left   Right Knee Flexion 4/5   Right Knee Extension 4/5   Left Knee Flexion 4/5   Left Knee Extension 4/5   Flexibility   Soft Tissue Assessment /Muscle Length yes   Hamstrings to 90 L; to 75 R with pain in glute region; negative biasing with DF   Piriformis pain with KTOS, piriformis stretch position in glute*   Palpation   SI assessment  pt does have 1/4" leg length difference supine  with possible ant rotation of the R innominate but most likely secondary and related to lumbar curvature   Palpation comment +2 ttp R sacral border   Special Tests    Special Tests Sacrolliac Tests;Lumbar   Lumbar Tests Straight Leg Raise   Sacroiliac Tests  Gaenslen's Test   Straight Leg Raise   Findings Negative   Side  Right   Pelvic Compression   Findings Negative   Sacral thrust    Findings Negative   Gaenslen's test   Findings Negative   Ambulation/Gait   Gait Comments not thoroughly assessed today          Eval performed.  Education on HEP with performance of each                 PT Education - 03/23/15 [phone]    Education provided Yes   Education Details HEP, diagnosis, POC   Person(s) Educated Patient   Methods Explanation;Demonstration;Handout   Comprehension Verbalized understanding;Returned demonstration          PT Short Term Goals - 03/23/15 1001    PT SHORT TERM GOAL #1   Title pt will be independent with HEP by 8/29   Time 1   Period Days   Status Achieved           PT Long Term Goals - 03/23/15 1001    PT LONG TERM GOAL #1   Title pt will perform single leg stance on the right without left hip drop to demonstrate improve LE strength    Time 4   Period Weeks   Status New   PT LONG TERM GOAL #2   Title pt will demonstrate a proper squat    Time 4   Period Weeks   Status New   PT LONG TERM GOAL #3   Title pt will improve LE MMT to at least 4/5    Time 4   Period Weeks   Status New   PT LONG TERM GOAL #4   Title pt will perform stairs at home without increased pain   Time 4   Period Weeks   Status New               Plan - 03/23/15 9242    Clinical Impression Statement Pt presents with c/o bilateral thigh, lateral hip, and R sided sacral border pain x 6 months for no known reason.  imaging showing mild hip OA bilateral and a signficant  lumbar curvature. Has a history of back pain, treatment for leg length  discrepancy about 15 years ago.  Pt demonstrates significant functional weakness of the hips, thighs, and core leading to abnormal biomechanics with squatting, single leg stance, and stairs.  Does still have leg length discrepancy in supine with possible innominate rotation.    Pt will benefit from skilled therapeutic intervention in order to improve on the following deficits Decreased activity tolerance;Decreased strength;Impaired flexibility;Pain   Rehab Potential Good   PT Frequency 2x / week   PT Duration 4 weeks   PT Treatment/Interventions Electrical Stimulation;Moist Heat;Therapeutic activities;Therapeutic exercise;Neuromuscular re-education;Patient/family education;Manual techniques;Taping   PT Next Visit Plan review HEP per instrucation section.  begin core/bilateral hip/thigh/knee strengthening moving towards functional strengthening in weightbearing.  STM R piriformis/sacral border, SIJ work if needed   [employer] and Agree with Plan of Care Patient         Problem List Patient Active Problem List   Diagnosis Date Noted  . Hip pain, bilateral 08/19/2014  . Benign paroxysmal positional vertigo 04/23/2014  . Atrophic vaginitis 04/23/2014  . Neck pain 10/18/2013  . Diverticulitis 10/18/2013  . Abdominal pain, left lower quadrant 10/11/2013  . Mass of mouth 10/15/2012  . Atypical chest pain 12/26/2011  . Liver hemangioma 03/16/2011  . Osteopenia 11/17/2010  . General medical examination 10/25/2010  . 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 Low back pain, episodic 10/25/2010  . VAGINITIS, ATROPHIC, POSTMENOPAUSAL 05/05/2009  . OSTEOARTHRITIS 06/30/2008  . HYPERCHOLESTEROLEMIA 04/03/2008  . DECREASED HEARING, BILATERAL 04/03/2008    Stark Bray, DPT, CMP 03/23/2015, 10:06 AM  [facility] [place], [place], [address] Phone: [phone]   Fax:  [phone]    G-Code FOTO = 61%  (39% limitation) Functional Limitation: Walking & moving around Current Status: CJ - At least 40% but less than 60% limited (39% limitation) Goal Status: CJ - At least 40% but less than 60% limited (predicted 35% limitation)  Percival Spanish, PT, MPT 03/26/2015, 11:15 AM  [facility] [address]  [place], [place], [address] Phone: [phone]   Fax:  [phone]

## 2015-03-26 ENCOUNTER — Ambulatory Visit: Payer: Medicare Other | Attending: Family Medicine | Admitting: Physical Therapy

## 2015-03-26 DIAGNOSIS — R29898 Other symptoms and signs involving the musculoskeletal system: Secondary | ICD-10-CM | POA: Diagnosis not present

## 2015-03-26 DIAGNOSIS — R2681 Unsteadiness on feet: Secondary | ICD-10-CM | POA: Insufficient documentation

## 2015-03-26 DIAGNOSIS — M533 Sacrococcygeal disorders, not elsewhere classified: Secondary | ICD-10-CM | POA: Insufficient documentation

## 2015-03-26 DIAGNOSIS — M25551 Pain in right hip: Secondary | ICD-10-CM | POA: Insufficient documentation

## 2015-03-26 DIAGNOSIS — M25552 Pain in left hip: Secondary | ICD-10-CM | POA: Diagnosis not present

## 2015-03-26 NOTE — Therapy (Signed)
[facility] [address]  [place], [place], [address] Phone: [phone]   Fax:  [phone]  Physical Therapy Treatment  Patient Details  Name: Jocelyn Morgan MRN: [id number] Date of Birth: 13-Mar-1951 Referring Provider:  Dene Gentry, MD  Encounter Date: 03/26/2015      PT End of Session - 03/26/15 0904    Visit Number 2   Number of Visits 8   Date for PT Re-Evaluation 04/20/15   PT Start Time 0845   PT Stop Time 0932   PT Time Calculation (min) 47 min   Activity Tolerance Patient tolerated treatment well   Behavior During Therapy [facility] for tasks assessed/performed      Past Medical History  Diagnosis Date  . Hyperlipidemia   . Arthritis     osteoarthritis  . Postmenopausal HRT (hormone replacement therapy)   . Liver hemangioma 03/16/2011    Past Surgical History  Procedure Laterality Date  . Tubal ligation    . Tubal ligation      There were no vitals filed for this visit.  Visit Diagnosis:  Hip pain, right  Hip pain, left  Sacral back pain      Subjective Assessment - 03/26/15 0849    Subjective Patient states very sore after initial visit and exercises but took a Meloxicam that night and it helped. Otherwise feels like HEP stretches are helping with the back pain.   Currently in Pain? Yes   Pain Score 2   1-2/10   Pain Location Hip   Pain Orientation Right;Left   Pain Score 0   Pain Location Back           Today's Treatment  TherEx Hamstring stretch with strap 3x20", bilateral  Piriformis stretch 3x20", bilateral  ITB stretch with strap 3x20", bilateral Bridging 10x3" Abdominal bracing 10x5" Bridge + TrA with ball btw knees 10x3" Hooklying - Hip adduction ball squeeze 10x3"                    Hip abduction clams with green TB + TrA DL/SL 10x3" each                    LE marching + TrA 10x3"                    LTR x10           PT Education - 03/26/15 0947     Education provided Yes   Education Details Addition to [employer]) Educated Patient   Methods Explanation;Demonstration;Handout   Comprehension Verbalized understanding;Returned demonstration;Need further instruction          PT Short Term Goals - 03/23/15 1001    PT SHORT TERM GOAL #1   Title pt will be independent with HEP by 8/29   Time 1   Period Days   Status Achieved           PT Long Term Goals - 03/23/15 1001    PT LONG TERM GOAL #1   Title pt will perform single leg stance on the right without left hip drop to demonstrate improve LE strength    Time 4   Period Weeks   Status New   PT LONG TERM GOAL #2   Title pt will demonstrate a proper squat    Time 4   Period Weeks   Status New   PT LONG TERM GOAL #3  Title pt will improve LE MMT to at least 4/5    Time 4   Period Weeks   Status New   PT LONG TERM GOAL #4   Title pt will perform stairs at home without increased pain   Time 4   Period Weeks   Status New               Plan - 03/26/15 0948    Clinical Impression Statement Patient reporting good initial relief from HEP stretches, with increased soreness only after intial visit. Bilateral Ober testing mildly positive for ITB tightness, therefore ITB stretch added to HEP. Also provided patient with green TB and added core stabilization and flexibility exercises to HEP for patient to continue with in addition to initial HEP while traveling to assist daughter with birth of new baby for next 2 weeks.   PT Next Visit Plan review HEP with upgrade as needed; core/bilateral hip/thigh/knee strengthening moving towards functional strengthening in weightbearing, STM R piriformis/sacral border, SIJ work if needed   Consulted and Agree with Plan of Care Patient        Problem List Patient Active Problem List   Diagnosis Date Noted  . Hip pain, bilateral 08/19/2014  . Benign paroxysmal positional vertigo 04/23/2014  . Atrophic vaginitis 04/23/2014   . Neck pain 10/18/2013  . Diverticulitis 10/18/2013  . Abdominal pain, left lower quadrant 10/11/2013  . Mass of mouth 10/15/2012  . Atypical chest pain 12/26/2011  . Liver hemangioma 03/16/2011  . Osteopenia 11/17/2010  . General medical examination 10/25/2010  . 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 Low back pain, episodic 10/25/2010  . VAGINITIS, ATROPHIC, POSTMENOPAUSAL 05/05/2009  . OSTEOARTHRITIS 06/30/2008  . HYPERCHOLESTEROLEMIA 04/03/2008  . DECREASED HEARING, BILATERAL 04/03/2008    Percival Spanish, PT, MPT 03/26/2015, 10:01 AM  [facility] [place], [place], [address] Phone: [phone]   Fax:  [phone]

## 2015-03-27 ENCOUNTER — Other Ambulatory Visit: Payer: Self-pay | Admitting: Family

## 2015-03-31 ENCOUNTER — Ambulatory Visit: Payer: Medicare Other | Admitting: Rehabilitation

## 2015-04-02 ENCOUNTER — Ambulatory Visit: Payer: Medicare Other | Admitting: Physical Therapy

## 2015-04-06 ENCOUNTER — Ambulatory Visit: Payer: Medicare Other | Admitting: Rehabilitation

## 2015-04-08 ENCOUNTER — Ambulatory Visit: Payer: Medicare Other | Admitting: Physical Therapy

## 2015-04-13 ENCOUNTER — Ambulatory Visit: Payer: Medicare Other | Admitting: Physical Therapy

## 2015-04-13 DIAGNOSIS — M25551 Pain in right hip: Secondary | ICD-10-CM | POA: Diagnosis not present

## 2015-04-13 DIAGNOSIS — M533 Sacrococcygeal disorders, not elsewhere classified: Secondary | ICD-10-CM

## 2015-04-13 DIAGNOSIS — M25552 Pain in left hip: Secondary | ICD-10-CM | POA: Diagnosis not present

## 2015-04-13 DIAGNOSIS — R29898 Other symptoms and signs involving the musculoskeletal system: Secondary | ICD-10-CM | POA: Diagnosis not present

## 2015-04-13 DIAGNOSIS — R2681 Unsteadiness on feet: Secondary | ICD-10-CM | POA: Diagnosis not present

## 2015-04-13 NOTE — Therapy (Signed)
[facility] [address]  [place], [place], [address] Phone: [phone]   Fax:  [phone]  Physical Therapy Treatment  Patient Details  Name: Jocelyn Morgan MRN: [id number] Date of Birth: 01-03-1951 Referring Provider:  Dene Gentry, MD  Encounter Date: 04/13/2015      PT End of Session - 04/13/15 0856    Visit Number 3   Number of Visits 8   Date for PT Re-Evaluation 04/20/15   PT Start Time 0848   PT Stop Time 0931   PT Time Calculation (min) 43 min   Activity Tolerance Patient tolerated treatment well   Behavior During Therapy [facility] for tasks assessed/performed      Past Medical History  Diagnosis Date  . Hyperlipidemia   . Arthritis     osteoarthritis  . Postmenopausal HRT (hormone replacement therapy)   . Liver hemangioma 03/16/2011    Past Surgical History  Procedure Laterality Date  . Tubal ligation    . Tubal ligation      There were no vitals filed for this visit.  Visit Diagnosis:  Hip pain, right  Hip pain, left  Sacral back pain      Subjective Assessment - 04/13/15 0852    Subjective Admits to limited performance of HEP while visiting with her daughter for the birth of her baby, but has been doing the exercises regularly since home last Wed. States pain is better but still has trouble climbing up stairs.   Currently in Pain? No/denies   Pain Score --  1-2/10 on average, mostly by end of day           Today's Treatment  TherEx Nustep lvl 4 x 5" Hamstring stretch with strap 3x20", bilateral  Piriformis stretch 3x20", bilateral  ITB stretch with strap 3x20", bilateral Hooklying - Hip adduction ball squeeze 10x3"  Hip abduction clams with green TB + TrA DL/SL 10x3" each  Abdominal bracing 10x5"                    LE marching + TrA 10x3"  LTR with feet on orange TB x10 Bridging 10x3" Bridge + TrA with ball  btw knees 10x3"            PT Short Term Goals - 03/23/15 1001    PT SHORT TERM GOAL #1   Title pt will be independent with HEP by 8/29   Time 1   Period Days   Status Achieved           PT Long Term Goals - 04/13/15 1233    PT LONG TERM GOAL #1   Title pt will perform single leg stance on the right without left hip drop to demonstrate improve LE strength    Status On-going   PT LONG TERM GOAL #2   Title pt will demonstrate a proper squat    Status On-going   PT LONG TERM GOAL #3   Title pt will improve LE MMT to at least 4/5    Status On-going   PT LONG TERM GOAL #4   Title pt will perform stairs at home without increased pain   Status On-going               Plan - 04/13/15 1229    Clinical Impression Statement Improving compliance with HEP since return from 2 week stay with daughter for birth of new grandbaby. Reporting overal improvment in pain but still with difficulty  lifting self up with LE when climbing stairs. Will plan to progress to more CKC and functional strenghtening at next visit.   PT Next Visit Plan review HEP with upgrade as needed; core/bilateral hip/thigh/knee strengthening moving towards functional strengthening in weightbearing, STM R piriformis/sacral border, SIJ work if needed   Consulted and Agree with Plan of Care Patient        Problem List Patient Active Problem List   Diagnosis Date Noted  . Hip pain, bilateral 08/19/2014  . Benign paroxysmal positional vertigo 04/23/2014  . Atrophic vaginitis 04/23/2014  . Neck pain 10/18/2013  . Diverticulitis 10/18/2013  . Abdominal pain, left lower quadrant 10/11/2013  . Mass of mouth 10/15/2012  . Atypical chest pain 12/26/2011  . Liver hemangioma 03/16/2011  . Osteopenia 11/17/2010  . General medical examination 10/25/2010  . 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 Low back pain, episodic 10/25/2010  . VAGINITIS, ATROPHIC, POSTMENOPAUSAL 05/05/2009  . OSTEOARTHRITIS  06/30/2008  . HYPERCHOLESTEROLEMIA 04/03/2008  . DECREASED HEARING, BILATERAL 04/03/2008    Percival Spanish, PT, MPT 04/13/2015, 12:35 PM  [facility] [address]  [place], [place], [address] Phone: [phone]   Fax:  [phone]

## 2015-04-16 ENCOUNTER — Ambulatory Visit: Payer: Medicare Other | Admitting: Physical Therapy

## 2015-04-16 DIAGNOSIS — R2681 Unsteadiness on feet: Secondary | ICD-10-CM | POA: Diagnosis not present

## 2015-04-16 DIAGNOSIS — M25552 Pain in left hip: Secondary | ICD-10-CM | POA: Diagnosis not present

## 2015-04-16 DIAGNOSIS — M25551 Pain in right hip: Secondary | ICD-10-CM | POA: Diagnosis not present

## 2015-04-16 DIAGNOSIS — M533 Sacrococcygeal disorders, not elsewhere classified: Secondary | ICD-10-CM | POA: Diagnosis not present

## 2015-04-16 DIAGNOSIS — R29898 Other symptoms and signs involving the musculoskeletal system: Secondary | ICD-10-CM | POA: Diagnosis not present

## 2015-04-16 NOTE — Therapy (Signed)
[facility] [address]  [place], [place], [address] Phone: [phone]   Fax:  [phone]  Physical Therapy Treatment  Patient Details  Name: Jocelyn Morgan MRN: [id number] Date of Birth: 04/27/1951 Referring Provider:  Dene Gentry, MD  Encounter Date: 04/16/2015      PT End of Session - 04/16/15 0847    Visit Number 4   Number of Visits 8   Date for PT Re-Evaluation 04/20/15   PT Start Time 0842   PT Stop Time 0928   PT Time Calculation (min) 46 min   Activity Tolerance Patient tolerated treatment well   Behavior During Therapy [facility] for tasks assessed/performed      Past Medical History  Diagnosis Date  . Hyperlipidemia   . Arthritis     osteoarthritis  . Postmenopausal HRT (hormone replacement therapy)   . Liver hemangioma 03/16/2011    Past Surgical History  Procedure Laterality Date  . Tubal ligation    . Tubal ligation      There were no vitals filed for this visit.  Visit Diagnosis:  Hip pain, right  Hip pain, left  Sacral back pain      Subjective Assessment - 04/16/15 0845    Subjective States was not able to do HEP until the evening yesterday (normally does it in the morning) and noted more difficulty with some of the exercises due to pain.   Currently in Pain? Yes   Pain Score 1    Pain Location Hip  & low back   Pain Orientation Right;Left           Today's Treatment  TherEx Nustep lvl 5 x 5" Piriformis stretch 3x20", bilateral, manual Prone on elbows x 2' Prone press-up 5x5" Fwd SL step-ups to 6" step 10x3", bilateral, 1 pole A Lat SL step-ups to 6" step 10x3", bilateral, 1 pole A Hip hike on 6" step 10x3", bilateral, 2 pole A Standing hip flexion with red TB 10x3", bilateral, HHA on counter Standing hip abduction with red TB 10x3", bilateral, HHA on counter Standing hip extension with red TB 10x3", bilateral, HHA on counter   Manual Tx STM/DTM to right  piriformis Sacral mobs A/P in POE and prone press-up Sacral gapping          PT Education - 04/16/15 0935    Education provided Yes   Education Details HEP progression - standing exercises   Person(s) Educated Patient   Methods Explanation;Demonstration;Handout   Comprehension Verbalized understanding;Returned demonstration;Need further instruction          PT Short Term Goals - 03/23/15 1001    PT SHORT TERM GOAL #1   Title pt will be independent with HEP by 8/29   Time 1   Period Days   Status Achieved           PT Long Term Goals - 04/13/15 1233    PT LONG TERM GOAL #1   Title pt will perform single leg stance on the right without left hip drop to demonstrate improve LE strength    Status On-going   PT LONG TERM GOAL #2   Title pt will demonstrate a proper squat    Status On-going   PT LONG TERM GOAL #3   Title pt will improve LE MMT to at least 4/5    Status On-going   PT LONG TERM GOAL #4   Title pt will perform stairs at home without increased pain  Status On-going               Plan - 04/16/15 1218    Clinical Impression Statement Increased localization of pain to right SIJ today therefore incorporated SI mobs with exercises today to facilitate increased mobility and decreased pain with patient noting some relief of pain and better tolerance for stretching. Progressed to standing exercises targeting hip strengthening with patient experiencing some instability with single leg stance while moving opposite limb, but otherwise tolerated new exercises well. HEP updated to include some standing exercises and patient instucted to continue with existing HEP stretches and core stability exercises.   PT Next Visit Plan review HEP with upgrade as needed; core/bilateral hip/thigh/knee strengthening with focus on functional strengthening in weightbearing, STM R piriformis/sacral border, SIJ work if needed   Consulted and Agree with Plan of Care Patient         Problem List Patient Active Problem List   Diagnosis Date Noted  . Hip pain, bilateral 08/19/2014  . Benign paroxysmal positional vertigo 04/23/2014  . Atrophic vaginitis 04/23/2014  . Neck pain 10/18/2013  . Diverticulitis 10/18/2013  . Abdominal pain, left lower quadrant 10/11/2013  . Mass of mouth 10/15/2012  . Atypical chest pain 12/26/2011  . Liver hemangioma 03/16/2011  . Osteopenia 11/17/2010  . General medical examination 10/25/2010  . 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 Low back pain, episodic 10/25/2010  . VAGINITIS, ATROPHIC, POSTMENOPAUSAL 05/05/2009  . OSTEOARTHRITIS 06/30/2008  . HYPERCHOLESTEROLEMIA 04/03/2008  . DECREASED HEARING, BILATERAL 04/03/2008    Percival Spanish, PT, MPT 04/16/2015, 12:25 PM  [facility] [address]  [place], [place], [address] Phone: [phone]   Fax:  [phone]

## 2015-04-20 ENCOUNTER — Ambulatory Visit: Payer: Medicare Other | Admitting: Rehabilitation

## 2015-04-20 DIAGNOSIS — R29898 Other symptoms and signs involving the musculoskeletal system: Secondary | ICD-10-CM | POA: Diagnosis not present

## 2015-04-20 DIAGNOSIS — M533 Sacrococcygeal disorders, not elsewhere classified: Secondary | ICD-10-CM | POA: Diagnosis not present

## 2015-04-20 DIAGNOSIS — M25551 Pain in right hip: Secondary | ICD-10-CM

## 2015-04-20 DIAGNOSIS — R2681 Unsteadiness on feet: Secondary | ICD-10-CM | POA: Diagnosis not present

## 2015-04-20 DIAGNOSIS — M25552 Pain in left hip: Secondary | ICD-10-CM

## 2015-04-20 NOTE — Therapy (Signed)
[facility] [address]  [place], [place], [address] Phone: [phone]   Fax:  [phone]  Physical Therapy Treatment  Patient Details  Name: Jocelyn Morgan MRN: [id number] Date of Birth: 18-Dec-1950 Referring Provider:  Dene Gentry, MD  Encounter Date: 04/20/2015      PT End of Session - 04/20/15 0847    Visit Number 5   Number of Visits 8   Date for PT Re-Evaluation 04/20/15   PT Start Time 0843   PT Stop Time 0930   PT Time Calculation (min) 47 min   Activity Tolerance Patient tolerated treatment well   Behavior During Therapy [facility] for tasks assessed/performed      Past Medical History  Diagnosis Date  . Hyperlipidemia   . Arthritis     osteoarthritis  . Postmenopausal HRT (hormone replacement therapy)   . Liver hemangioma 03/16/2011    Past Surgical History  Procedure Laterality Date  . Tubal ligation    . Tubal ligation      There were no vitals filed for this visit.  Visit Diagnosis:  Hip pain, right  Hip pain, left  Sacral back pain      Subjective Assessment - 04/20/15 0845    Subjective Reports feeling ok this morning but Saturday was a bad day. Unsure why but stopped doing the band exercise out to the side due to feeling like she wasn't doing it right. Felt better after not doing this one. Saturday pains were sharp and shooting pains every time she would step. Reports pain at night is typically around 4-5/10.    Currently in Pain? Yes   Pain Score --  0.5/10   Pain Location Back   Pain Orientation Lower       Today's Treatment  TherEx Nustep lvl 5 x 5" Piriformis, SKTC stretch 3x20", bilateral, manual Bridges 10x3" Prone on elbows x 2' Prone press-up 5x5" Fwd SL step-ups to 6" step 10x3", bilateral, 1 pole A Lat SL step-ups to 6" step 10x3", bilateral, 1 pole A Hip hike on 6" step 3x3", 2 pole A (noted sharp, shooting pain down the Rt side, stopped  exercise) Standing hip flexion with red TB 10x3", bilateral, 2 pole A Standing hip abduction with red TB 10x3", bilateral, 2 pole A Standing hip extension with red TB 10x3", bilateral, 2 pole A   Manual Tx STM/DTM to right piriformis        PT Short Term Goals - 03/23/15 1001    PT SHORT TERM GOAL #1   Title pt will be independent with HEP by 8/29   Time 1   Period Days   Status Achieved           PT Long Term Goals - 04/13/15 1233    PT LONG TERM GOAL #1   Title pt will perform single leg stance on the right without left hip drop to demonstrate improve LE strength    Status On-going   PT LONG TERM GOAL #2   Title pt will demonstrate a proper squat    Status On-going   PT LONG TERM GOAL #3   Title pt will improve LE MMT to at least 4/5    Status On-going   PT LONG TERM GOAL #4   Title pt will perform stairs at home without increased pain   Status On-going               Plan - 04/20/15 5102  Clinical Impression Statement Still very tender with Rt piriformis area. Reviewed HEP and made modifications with band length and stopping hip hiking exercise due to sharp pains. Pt also unstable with standing exercises.    PT Next Visit Plan review HEP with upgrade as needed; core/bilateral hip/thigh/knee strengthening with focus on functional strengthening in weightbearing, STM R piriformis/sacral border, SIJ work if needed   Consulted and Agree with Plan of Care Patient        Problem List Patient Active Problem List   Diagnosis Date Noted  . Hip pain, bilateral 08/19/2014  . Benign paroxysmal positional vertigo 04/23/2014  . Atrophic vaginitis 04/23/2014  . Neck pain 10/18/2013  . Diverticulitis 10/18/2013  . Abdominal pain, left lower quadrant 10/11/2013  . Mass of mouth 10/15/2012  . Atypical chest pain 12/26/2011  . Liver hemangioma 03/16/2011  . Osteopenia 11/17/2010  . General medical examination 10/25/2010  . Carpal tunnel syndrome of right wrist  10/25/2010  . Low back pain, episodic 10/25/2010  . VAGINITIS, ATROPHIC, POSTMENOPAUSAL 05/05/2009  . OSTEOARTHRITIS 06/30/2008  . HYPERCHOLESTEROLEMIA 04/03/2008  . DECREASED HEARING, BILATERAL 04/03/2008    Barbette Hair, PTA 04/20/2015, 9:32 AM  [facility] [address]  [place], [place], [address] Phone: [phone]   Fax:  [phone]

## 2015-04-23 ENCOUNTER — Ambulatory Visit: Payer: Medicare Other | Admitting: Physical Therapy

## 2015-04-23 DIAGNOSIS — R2681 Unsteadiness on feet: Secondary | ICD-10-CM | POA: Diagnosis not present

## 2015-04-23 DIAGNOSIS — M25552 Pain in left hip: Secondary | ICD-10-CM | POA: Diagnosis not present

## 2015-04-23 DIAGNOSIS — R29898 Other symptoms and signs involving the musculoskeletal system: Secondary | ICD-10-CM | POA: Diagnosis not present

## 2015-04-23 DIAGNOSIS — M533 Sacrococcygeal disorders, not elsewhere classified: Secondary | ICD-10-CM | POA: Diagnosis not present

## 2015-04-23 DIAGNOSIS — M25551 Pain in right hip: Secondary | ICD-10-CM | POA: Diagnosis not present

## 2015-04-23 NOTE — Therapy (Signed)
[facility] [address]  [place], [place], [address] Phone: [phone]   Fax:  [phone]  Physical Therapy Treatment  Patient Details  Name: Jocelyn Morgan MRN: [id number] Date of Birth: Oct 10, 1950 Referring Provider:  Dene Gentry, MD  Encounter Date: 04/23/2015      PT End of Session - 04/23/15 1014    Visit Number 6   Number of Visits 10   Date for PT Re-Evaluation 05/21/15   PT Start Time 1010   PT Stop Time 1058   PT Time Calculation (min) 48 min   Activity Tolerance Patient tolerated treatment well   Behavior During Therapy [facility] for tasks assessed/performed      Past Medical History  Diagnosis Date  . Hyperlipidemia   . Arthritis     osteoarthritis  . Postmenopausal HRT (hormone replacement therapy)   . Liver hemangioma 03/16/2011    Past Surgical History  Procedure Laterality Date  . Tubal ligation    . Tubal ligation      There were no vitals filed for this visit.  Visit Diagnosis:  Sacral back pain  Leg weakness, bilateral  Unsteadiness on feet      Subjective Assessment - 04/23/15 1016    Subjective Patient noting overall improvement with no further lateral hip pain but still experiencing low back/SI late in day. Reports has only done stretches, no strengthening exercises, for past 2 days secondary to traveling.   Currently in Pain? No/denies   Pain Score --  Pain at end of day 3-4/10            [facility] PT Assessment - 04/23/15 1010    Squat   Comments symmetrical weight bearing but still tends to shift knees over toes with slight foward lean   Single Leg Stance   Comments struggles to prevent hip drop with stance on R   Strength   Strength Assessment Site Hip   Right/Left Hip Right;Left   Right Hip Flexion 4/5   Right Hip Extension 4-/5   Right Hip ABduction 4/5   Left Hip Flexion 4/5   Left Hip Extension 4-/5   Left Hip ABduction 4/5   Right/Left Knee  Right;Left   Right Knee Flexion 4+/5   Right Knee Extension 4+/5   Left Knee Flexion 4+/5   Left Knee Extension 4+/5           Today's Treatment  Recert: MMT & functional reassessment  TherEx Nustep lvl 6 x 5" Bridges with ball squeeze 10x3" Dead bug 10x3" Partial squat 10x3", HHA on edge of sink Standing alternating hip flexion on blue foam 10x3", bilateral, 2 pole A Standing alternating hip abduction on blue foam 10x3", bilateral, 2 pole A Standing alternating hip extension on blue foam  10x3", bilateral, 2 pole A Standing SL stance on blue foam 5x10", intermittent 1 pole A           PT Short Term Goals - 03/23/15 1001    PT SHORT TERM GOAL #1   Title pt will be independent with HEP by 8/29   Time 1   Period Days   Status Achieved           PT Long Term Goals - 04/23/15 1224    PT LONG TERM GOAL #1   Title pt will perform single leg stance on the right without left hip drop to demonstrate improve LE strength (05/21/15)   Status On-going   PT LONG TERM  GOAL #2   Title pt will demonstrate a proper squat (05/21/15)   Status On-going   PT LONG TERM GOAL #3   Title pt will improve LE MMT to at least 4/5 (05/21/15)   Status Partially Met   PT LONG TERM GOAL #4   Title pt will perform stairs at home without increased pain   Status On-going               Plan - 04/23/15 1216    Clinical Impression Statement Patient reporting overall improvement in pain and activity tolerance with no further lateral hip pain, postive response to STM for piriformis and improved ability to climb stairs reciprocally without pain, however patient still experiencing some right low back/SI pain typically at the end of the day. LE strength improving per MMT, but remains limited in core strength/stbility. Progress delayed initially as patient traveled out of town for 2 weeks early in [place]. Patient will benefit from extension of PT services x 1 month with frequency reduced to  1x/wk to continue core/proximal LE strengthening and STM as needed with increasing emphasis on HEP including weekly updates/modifications as needed.   PT Frequency 1x / week   PT Duration 4 weeks   PT Treatment/Interventions Electrical Stimulation;Moist Heat;Taping;Iontophoresis 62m/ml Dexamethasone;Ultrasound;Manual techniques;Therapeutic activities;Therapeutic exercise;Neuromuscular re-education;Balance training;Patient/family education   PT Next Visit Plan review HEP with upgrade as needed; core/bilateral hip/thigh/knee strengthening with focus on functional strengthening in weightbearing, STM R piriformis/sacral border, SIJ work if needed   Consulted and Agree with Plan of Care Patient        Problem List Patient Active Problem List   Diagnosis Date Noted  . Hip pain, bilateral 08/19/2014  . Benign paroxysmal positional vertigo 04/23/2014  . Atrophic vaginitis 04/23/2014  . Neck pain 10/18/2013  . Diverticulitis 10/18/2013  . Abdominal pain, left lower quadrant 10/11/2013  . Mass of mouth 10/15/2012  . Atypical chest pain 12/26/2011  . Liver hemangioma 03/16/2011  . Osteopenia 11/17/2010  . General medical examination 10/25/2010  . 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 Low back pain, episodic 10/25/2010  . VAGINITIS, ATROPHIC, POSTMENOPAUSAL 05/05/2009  . OSTEOARTHRITIS 06/30/2008  . HYPERCHOLESTEROLEMIA 04/03/2008  . DECREASED HEARING, BILATERAL 04/03/2008    JPercival Spanish PT, MPT 04/23/2015, 12:31 PM  [facility] [place] [address]Phone: [phone]  Fax:  [phone]

## 2015-04-27 ENCOUNTER — Encounter: Payer: Self-pay | Admitting: Behavioral Health

## 2015-04-27 ENCOUNTER — Telehealth: Payer: Self-pay | Admitting: Behavioral Health

## 2015-04-27 NOTE — Addendum Note (Signed)
Addended by: Eduard Roux E on: 04/27/2015 12:24 PM   Modules accepted: Medications

## 2015-04-27 NOTE — Telephone Encounter (Signed)
Pre-Visit Call completed with patient and chart updated.   Pre-Visit Info documented in Specialty Comments under [employer].    

## 2015-04-27 NOTE — Telephone Encounter (Signed)
Spoke with the patient's spouse and he voiced that he will have his wife to return the call later on today.

## 2015-04-28 ENCOUNTER — Ambulatory Visit (INDEPENDENT_AMBULATORY_CARE_PROVIDER_SITE_OTHER): Payer: Medicare Other | Admitting: Family

## 2015-04-28 ENCOUNTER — Encounter: Payer: Self-pay | Admitting: Family

## 2015-04-28 VITALS — HR 74 | Temp 97.7°F | Resp 18 | Ht 62.0 in | Wt 145.4 lb

## 2015-04-28 DIAGNOSIS — Z0000 Encounter for general adult medical examination without abnormal findings: Secondary | ICD-10-CM | POA: Diagnosis not present

## 2015-04-28 DIAGNOSIS — Z1239 Encounter for other screening for malignant neoplasm of breast: Secondary | ICD-10-CM

## 2015-04-28 DIAGNOSIS — Z23 Encounter for immunization: Secondary | ICD-10-CM | POA: Diagnosis not present

## 2015-04-28 DIAGNOSIS — E785 Hyperlipidemia, unspecified: Secondary | ICD-10-CM

## 2015-04-28 LAB — LIPID PANEL
CHOL/HDL RATIO: 4
Cholesterol: 186 mg/dL (ref 0–200)
HDL: 46.7 mg/dL (ref >39.00)
LDL Cholesterol: 108 mg/dL — ABNORMAL HIGH (ref 0–99)
NONHDL: 139.33
TRIGLYCERIDES: 155 mg/dL — AB (ref 0.0–149.0)
VLDL: 31 mg/dL (ref 0.0–40.0)

## 2015-04-28 LAB — HEPATIC FUNCTION PANEL
ALK PHOS: 49 U/L (ref 39–117)
ALT: 15 U/L (ref 0–35)
AST: 18 U/L (ref 0–37)
Albumin: 4.7 g/dL (ref 3.5–5.2)
BILIRUBIN DIRECT: 0.1 mg/dL (ref 0.0–0.3)
BILIRUBIN TOTAL: 0.6 mg/dL (ref 0.2–1.2)
TOTAL PROTEIN: 7.5 g/dL (ref 6.0–8.3)

## 2015-04-28 MED ORDER — PRAVASTATIN SODIUM 20 MG PO TABS
20.0000 mg | ORAL_TABLET | Freq: Every day | ORAL | Status: DC
Start: 2015-04-28 — End: 2016-01-11

## 2015-04-28 NOTE — Progress Notes (Signed)
Subjective:    Jocelyn Morgan is a 64 y.o. female who presents for Medicare Initial preventive examination.  Preventive Screening-Counseling & Management  Tobacco History  Smoking status  . Never Smoker   Smokeless tobacco  . Never Used     Problems Prior to Visit 1. Atrophic Vaginitis- uses estrace- some of the time.   2.  Hyperlipidemia- maintained on pravastatin.  Lab Results  Component Value Date   CHOL 157 11/21/2014   HDL 39.50 11/21/2014   LDLCALC 89 11/21/2014   LDLDIRECT 102.3 04/23/2014   TRIG 145.0 11/21/2014   CHOLHDL 4 11/21/2014   3. Preventative-  Immunizations: due for flu shot Diet: trying to eat healthy Exercise: starting PT for back/hip Colonoscopy: 2012 Dexa: 9/14 Pap Smear: 10/15- negative Mammogram:11/15    Current Problems (verified) Patient Active Problem List   Diagnosis Date Noted  . Hip pain, bilateral 08/19/2014  . Benign paroxysmal positional vertigo 04/23/2014  . Atrophic vaginitis 04/23/2014  . Neck pain 10/18/2013  . Diverticulitis 10/18/2013  . Abdominal pain, left lower quadrant 10/11/2013  . Mass of mouth 10/15/2012  . Atypical chest pain 12/26/2011  . Liver hemangioma 03/16/2011  . Osteopenia 11/17/2010  . General medical examination 10/25/2010  . 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 Low back pain, episodic 10/25/2010  . VAGINITIS, ATROPHIC, POSTMENOPAUSAL 05/05/2009  . OSTEOARTHRITIS 06/30/2008  . HYPERCHOLESTEROLEMIA 04/03/2008  . DECREASED HEARING, BILATERAL 04/03/2008    Medications Prior to Visit Current Outpatient Prescriptions on File Prior to Visit  Medication Sig Dispense Refill  . aspirin 81 MG EC tablet Take 81 mg by mouth daily.      . Calcium-Vitamin D 500-125 MG-UNIT TABS Take 1 tablet by mouth daily.      Marland Kitchen estradiol (ESTRACE VAGINAL) 0.1 MG/GM vaginal cream One gram PV twice weekly at bedtime 42.5 g 5  . meloxicam (MOBIC) 7.5 MG tablet TAKE 1 TABLET (7.5 MG TOTAL) BY MOUTH DAILY AS  NEEDED FOR PAIN. 30 tablet 1  . Misc Natural Products (OSTEO BI-FLEX ADV TRIPLE ST) TABS Take 1 tablet by mouth daily.      . Multiple Vitamin (MULTIVITAMIN) tablet Take 1 tablet by mouth daily.      . Omega-3 Fatty Acids (FISH OIL) 1000 MG CAPS Take 2 capsules (2,000 mg total) by mouth 2 (two) times daily.  0  . pravastatin (PRAVACHOL) 20 MG tablet Take 1 tablet (20 mg total) by mouth daily. 90 tablet 1   No current facility-administered medications on file prior to visit.    Current Medications (verified) Current Outpatient Prescriptions  Medication Sig Dispense Refill  . aspirin 81 MG EC tablet Take 81 mg by mouth daily.      . Calcium-Vitamin D 500-125 MG-UNIT TABS Take 1 tablet by mouth daily.      Marland Kitchen estradiol (ESTRACE VAGINAL) 0.1 MG/GM vaginal cream One gram PV twice weekly at bedtime 42.5 g 5  . meloxicam (MOBIC) 7.5 MG tablet TAKE 1 TABLET (7.5 MG TOTAL) BY MOUTH DAILY AS NEEDED FOR PAIN. 30 tablet 1  . Misc Natural Products (OSTEO BI-FLEX ADV TRIPLE ST) TABS Take 1 tablet by mouth daily.      . Multiple Vitamin (MULTIVITAMIN) tablet Take 1 tablet by mouth daily.      . Omega-3 Fatty Acids (FISH OIL) 1000 MG CAPS Take 2 capsules (2,000 mg total) by mouth 2 (two) times daily.  0  . pravastatin (PRAVACHOL) 20 MG tablet Take 1 tablet (20 mg total) by mouth  daily. 90 tablet 1   No current facility-administered medications for this visit.     Allergies (verified) Review of patient's allergies indicates no known allergies.   PAST HISTORY  Family History Family History  Problem Relation Age of Onset  . Cancer Mother 49    breast  . Arthritis Other   . Diabetes Other   . Cancer Other     bladder  . Ulcerative colitis Brother     Social History Social History  Substance Use Topics  . Smoking status: Never Smoker   . Smokeless tobacco: Never Used  . Alcohol Use: 0.6 oz/week    1 Glasses of wine per week     Are there smokers in your home (other than you)? No  Risk  Factors Current exercise habits: physical therapy  Dietary issues discussed: continue healthy diet   Cardiac risk factors: advanced age (older than 4 for men, 3 for women) and dyslipidemia.  Depression Screen (Note: if answer to either of the following is "Yes", a more complete depression screening is indicated)   Over the past 2 weeks, have you felt down, depressed or hopeless? No  Over the past 2 weeks, have you felt little interest or pleasure in doing things? No  Have you lost interest or pleasure in daily life? No  Do you often feel hopeless? No  Do you cry easily over simple problems? No  Activities of Daily Living In your present state of health, do you have any difficulty performing the following activities?:  Driving? No Managing money?  No Feeding yourself? No Getting from bed to chair? No  Climbing a flight of stairs? No Preparing food and eating?: No Bathing or showering? No Getting dressed: No Getting to the toilet? No Using the toilet:No Moving around from place to place: No In the past year have you fallen or had a near fall?:No   Are you sexually active?  Yes  Do you have more than one partner?  No  Hearing Difficulties: Yes Do you often ask people to speak up or repeat themselves? No Do you experience ringing or noises in your ears? No Do you have difficulty understanding soft or whispered voices? Yes   Do you feel that you have a problem with memory? No  Do you often misplace items? No  Do you feel safe at home?  Yes  Cognitive Testing  Alert? Yes  Normal Appearance?Yes  Oriented to person? Yes  Place? Yes   Time? Yes  Recall of three objects?  Yes  Can perform simple calculations? Yes  Displays appropriate judgment?Yes  Can read the correct time from a watch face?Yes   Advanced Directives have been discussed with the patient? Yes  List the Names of Other Physician/Practitioners you currently use: 1.    Indicate any recent Medical Services  you may have received from other than [facility] providers in the past year (date may be approximate).  Immunization History  Administered Date(s) Administered  . Influenza Whole 05/05/2009  . Influenza-Unspecified 04/24/2014  . Td 03/15/2005  . Zoster 08/31/2010    Screening Tests Health Maintenance  Topic Date Due  . Hepatitis C Screening  01-Jan-1951  . PNA vac Low Risk Adult (1 of 2 - PCV13) 01/05/2015  . INFLUENZA VACCINE  02/23/2015  . TETANUS/TDAP  03/16/2015  . HIV Screening  02/11/2016 (Originally 01/04/1965)  . COLONOSCOPY  12/08/2015  . MAMMOGRAM  06/06/2016  . PAP SMEAR  04/23/2017  . DEXA SCAN  Completed  .  ZOSTAVAX  Completed    All answers were reviewed with the patient and necessary referrals were made:  O'SULLIVAN,Kerstyn Coryell S., NP   04/28/2015   History reviewed: allergies, current medications, past family history, past medical history, past social history, past surgical history and problem list  Review of Systems   Review of Systems  Constitutional: Negative for fever.  Eyes: Negative for blurred vision.  Respiratory: Negative for cough.   Cardiovascular: Negative for chest pain.  Gastrointestinal: Negative for nausea and vomiting.  Genitourinary: Negative for dysuria and frequency.  Musculoskeletal: Negative for neck pain.  Skin: Negative for rash.  Neurological: Negative for dizziness and headaches.  Endo/Heme/Allergies: Does not bruise/bleed easily.  Psychiatric/Behavioral: Negative for depression.    Objective:  Body mass index is 26.59 kg/(m^2). Pulse 74  Temp(Src) 97.7 F (36.5 C) (Oral)  Resp 18  Ht 5\' 2"  (1.575 m)  Wt 145 lb 6.4 oz (65.953 kg)  BMI 26.59 kg/m2  SpO2 99%  LMP 07/25/1998     Physical Exam  Constitutional: She is oriented to person, place, and time. She appears well-developed and well-nourished. No distress.  HENT:  Head: Normocephalic and atraumatic.  Right Ear: Tympanic membrane and ear canal normal.  Left Ear: Tympanic  membrane and ear canal normal.  Mouth/Throat: Oropharynx is clear and moist.  Eyes: Pupils are equal, round, and reactive to light. No scleral icterus.  Neck: Normal range of motion. No thyromegaly present.  Cardiovascular: Normal rate and regular rhythm.   No murmur heard. Pulmonary/Chest: Effort normal and breath sounds normal. No respiratory distress. He has no wheezes. She has no rales. She exhibits no tenderness.  Abdominal: Soft. Bowel sounds are normal. He exhibits no distension and no mass. There is no tenderness. There is no rebound and no guarding.  Musculoskeletal: She exhibits no edema.  Lymphadenopathy:    She has no cervical adenopathy.  Neurological: She is alert and oriented to person, place, and time. She has normal reflexes. She exhibits normal muscle tone. Coordination normal.  Skin: Skin is warm and dry.  Psychiatric: She has a normal mood and affect. Her behavior is normal. Judgment and thought content normal.  Breasts: Examined lying Right: Without masses, retractions, discharge or axillary adenopathy.  Left: Without masses, retractions, discharge or axillary adenopathy.  Pelvic: deferred       Assessment & Plan:      Assessment:          Plan:     During the course of the visit the patient was educated and counseled about appropriate screening and preventive services including:    Pneumococcal vaccine   Influenza vaccine  Td vaccine  Screening mammography  Bone densitometry screening  Colorectal cancer screening  Nutrition counseling   Advanced directives: no advanced directive- pt was given copy of [facility] advanced directive form and MOST form for review  Diet review for nutrition referral? Yes ____  Not Indicated __x__   Patient Instructions (the written plan) was given to the patient.  Medicare Attestation I have personally reviewed: The patient's medical and social history Their use of alcohol, tobacco or illicit drugs Their current  medications and supplements The patient's functional ability including ADLs,fall risks, home safety risks, cognitive, and hearing and visual impairment Diet and physical activities Evidence for depression or mood disorders  The patient's weight, height, BMI, and visual acuity have been recorded in the chart.  I have made referrals, counseling, and provided education to the patient based on review of the above and  I have provided the patient with a written personalized care plan for preventive services.     Nance Pear., NP   04/28/2015      Patient ID: Jocelyn Morgan, female   DOB: February 21, 1951, 64 y.o.   MRN: [id number]

## 2015-04-28 NOTE — Patient Instructions (Addendum)
Try to get some regular walking. Goal is 30 minutes 5 days a week. Call me for colonoscopy referral this spring. Follow up in 1 year for your wellness visit.

## 2015-04-28 NOTE — Progress Notes (Signed)
Pre visit review using our clinic review tool, if applicable. No additional management support is needed unless otherwise documented below in the visit note.,h  

## 2015-04-30 ENCOUNTER — Ambulatory Visit: Payer: Medicare Other | Attending: Family Medicine | Admitting: Physical Therapy

## 2015-04-30 DIAGNOSIS — M533 Sacrococcygeal disorders, not elsewhere classified: Secondary | ICD-10-CM | POA: Diagnosis not present

## 2015-04-30 DIAGNOSIS — M25552 Pain in left hip: Secondary | ICD-10-CM | POA: Diagnosis not present

## 2015-04-30 DIAGNOSIS — R2681 Unsteadiness on feet: Secondary | ICD-10-CM

## 2015-04-30 DIAGNOSIS — M25551 Pain in right hip: Secondary | ICD-10-CM | POA: Insufficient documentation

## 2015-04-30 DIAGNOSIS — R29898 Other symptoms and signs involving the musculoskeletal system: Secondary | ICD-10-CM | POA: Insufficient documentation

## 2015-04-30 NOTE — Therapy (Addendum)
[facility] [place] [address]  [place], [place], [address] Phone: [phone]   Fax:  [phone]  Physical Therapy Treatment  Patient Details  Name: Jocelyn Morgan MRN: [id number] Date of Birth: 02/13/1951 Referring Provider:  Dene Gentry, MD  Encounter Date: 04/30/2015      PT End of Session - 04/30/15 0937    Visit Number 7   Number of Visits 10   Date for PT Re-Evaluation 05/21/15   PT Start Time 0930   PT Stop Time 1016   PT Time Calculation (min) 46 min   Activity Tolerance Patient tolerated treatment well   Behavior During Therapy [facility] for tasks assessed/performed      Past Medical History  Diagnosis Date  . Hyperlipidemia   . Arthritis     osteoarthritis  . Postmenopausal HRT (hormone replacement therapy)   . Liver hemangioma 03/16/2011    Past Surgical History  Procedure Laterality Date  . Tubal ligation    . Tubal ligation      There were no vitals filed for this visit.  Visit Diagnosis:  Sacral back pain  Leg weakness, bilateral  Unsteadiness on feet      Subjective Assessment - 04/30/15 0935    Subjective Patient reporting sensation of muscle "discomfort" (not pain) in thighs when climbing stairs. No pain other than muscle spasm one day last week that resolved with stretching.   Currently in Pain? No/denies          Today's Treatment  TherEx Nustep lvl 6 x 5" Fwd SL step-ups to BOSU (up) 10x3", bilateral, intermittent HHA on counter Lat SL step-ups to BOSU (up) 10x3", bilateral, intermittent HHA on counter TRX squat 10x3" TRX squat with heel raise 10x3" Lateral eccentric reach down from 4" step with toe touch 10x3", 1 pole A SLS on blue foam with toe touch F/S/B x5, bilateral, 1 pole A Sidestepping in partial squat with green TB x20 ft, bilateral        PT Education - 04/30/15 1037    Education provided Yes   Education Details HEP progression - step-down, heel  raise added at end of squat, SLS reach with opposite leg   Person(s) Educated Patient   Methods Explanation;Demonstration;Handout   Comprehension Verbalized understanding;Returned demonstration          PT Short Term Goals - 03/23/15 1001    PT SHORT TERM GOAL #1   Title pt will be independent with HEP by 8/29   Time 1   Period Days   Status Achieved           PT Long Term Goals - 04/30/15 1033    PT LONG TERM GOAL #1   Title pt will perform single leg stance on the right without left hip drop to demonstrate improve LE strength (05/21/15)   Status On-going   PT LONG TERM GOAL #2   Title pt will demonstrate a proper squat (05/21/15)   Status Achieved   PT LONG TERM GOAL #3   Title pt will improve LE MMT to at least 4/5 (05/21/15)   Status Partially Met   PT LONG TERM GOAL #4   Title pt will perform stairs at home without increased pain   Status On-going               Plan - 04/30/15 1025    Clinical Impression Statement Patient states like she feels like her balance is improving but still notes some  weakness and muscle discomfort (no pain) with activities such as climbing stairs. Tolerating progression of strengthening/stability exercises without c/o pain. Continue to update/progress HEP with patient reporting good compliance.   PT Next Visit Plan review HEP with upgrade as needed; core/bilateral hip/thigh/knee strengthening with focus on functional strengthening in weightbearing, STM R piriformis/sacral border, SIJ work if needed   Consulted and Agree with Plan of Care Patient        Problem List Patient Active Problem List   Diagnosis Date Noted  . Hip pain, bilateral 08/19/2014  . Benign paroxysmal positional vertigo 04/23/2014  . Atrophic vaginitis 04/23/2014  . Neck pain 10/18/2013  . Diverticulitis 10/18/2013  . Abdominal pain, left lower quadrant 10/11/2013  . Mass of mouth 10/15/2012  . Atypical chest pain 12/26/2011  . Liver hemangioma  03/16/2011  . Osteopenia 11/17/2010  . General medical examination 10/25/2010  . 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 Low back pain, episodic 10/25/2010  . VAGINITIS, ATROPHIC, POSTMENOPAUSAL 05/05/2009  . OSTEOARTHRITIS 06/30/2008  . HYPERCHOLESTEROLEMIA 04/03/2008  . DECREASED HEARING, BILATERAL 04/03/2008    Percival Spanish, PT, MPT 04/30/2015, 10:40 AM  [facility] [place], [place], [address] Phone: [phone]   Fax:  [phone]

## 2015-05-07 ENCOUNTER — Ambulatory Visit: Payer: Medicare Other | Admitting: Physical Therapy

## 2015-05-07 DIAGNOSIS — M533 Sacrococcygeal disorders, not elsewhere classified: Secondary | ICD-10-CM | POA: Diagnosis not present

## 2015-05-07 DIAGNOSIS — M25551 Pain in right hip: Secondary | ICD-10-CM

## 2015-05-07 DIAGNOSIS — M25552 Pain in left hip: Secondary | ICD-10-CM

## 2015-05-07 DIAGNOSIS — R2681 Unsteadiness on feet: Secondary | ICD-10-CM

## 2015-05-07 DIAGNOSIS — R29898 Other symptoms and signs involving the musculoskeletal system: Secondary | ICD-10-CM | POA: Diagnosis not present

## 2015-05-07 NOTE — Therapy (Signed)
[facility] [place] [address]  [place], [place], [address] Phone: [phone]   Fax:  [phone]  Physical Therapy Treatment  Patient Details  Name: Jocelyn Morgan MRN: [id number] Date of Birth: September 19, 1950 Referring Provider:  Dene Gentry, MD  Encounter Date: 05/07/2015      PT End of Session - 05/07/15 0852    Visit Number 8   Number of Visits 10   Date for PT Re-Evaluation 05/21/15   PT Start Time 0845   PT Stop Time 0932   PT Time Calculation (min) 47 min   Activity Tolerance Patient tolerated treatment well   Behavior During Therapy [facility] for tasks assessed/performed      Past Medical History  Diagnosis Date  . Hyperlipidemia   . Arthritis     osteoarthritis  . Postmenopausal HRT (hormone replacement therapy)   . Liver hemangioma 03/16/2011    Past Surgical History  Procedure Laterality Date  . Tubal ligation    . Tubal ligation      There were no vitals filed for this visit.  Visit Diagnosis:  Sacral back pain  Leg weakness, bilateral  Unsteadiness on feet  Hip pain, right  Hip pain, left      Subjective Assessment - 05/07/15 0849    Subjective Patient reporting more soreness than pain at present but notes continued increase in soreness as day progresses, particularly with climbing stairs. States sometimes does stairs up to 12-15x/day. Had increased soreness Sun & Mon so backed off some and better now.   Currently in Pain? Yes   Pain Score 1    Pain Location Hip   Pain Orientation Right;Left;Lateral   Pain Descriptors / Indicators Sore            [facility] PT Assessment - 05/07/15 0845    Strength   Strength Assessment Site Hip   Right/Left Hip Right;Left   Right Hip Flexion 4/5   Right Hip Extension 4-/5   Right Hip ABduction 4-/5  mid ITB pain when resistance applied (relieved with STM)   Right Hip ADduction 4/5   Left Hip Flexion 4/5   Left Hip Extension 4-/5   Left  Hip ABduction 4/5   Left Hip ADduction 4/5         Today's Treatment  TherEx Nustep lvl 6 x 5\6" TRX squat with heel raise 10x3" Fwd SL step-ups to BOSU (up) 10x3", bilateral, 2 pole A Lat SL step-ups to BOSU (up) 10x3", bilateral, 2 pole A Step-up to 6" step with alternate hip flexion with red TB anchor 10x3", bilateral, 2 pole A Lateral eccentric reach down from 6" step with toe touch 10x3", 2 pole A  Manual Tx Sacral mobilization right SIJ Sacral gapping MET to correct right posterior innominate rotation STM/DTM to right piriformis in prone STM/stumming to right ITB in Lt sidelying            PT Short Term Goals - 03/23/15 1001    PT SHORT TERM GOAL #1   Title pt will be independent with HEP by 8/29   Time 1   Period Days   Status Achieved           PT Long Term Goals - 05/07/15 1038    PT LONG TERM GOAL #1   Title pt will perform single leg stance on the right without left hip drop to demonstrate improve LE strength (05/21/15)   Status On-going   PT LONG TERM GOAL #  2   Title pt will demonstrate a proper squat (05/21/15)   Status Achieved   PT LONG TERM GOAL #3   Title pt will improve LE MMT to at least 4/5 (05/21/15)   Status Partially Met   PT LONG TERM GOAL #4   Title pt will perform stairs at home without increased pain   Status On-going               Plan - 05/07/15 1029    Clinical Impression Statement Patient reports more soreness than pain recently and continues to fatigue later in the day but does admit to typically going up/down the stairs up to 12-15 times per day. Soreness most common at right SIJ and lateral hips when present therefore addressed SIJ mobilization and ITB/piriformis STM/DTM with manual therapy today. Moderate tenderness in right ITB but resolved well after manual stretch and STM. Tolerated exercise porgression during therapy but did not progress HEP today secondary tto increased time spent on manual therapy.   PT Next  Visit Plan review HEP with upgrade as needed; core/bilateral hip/thigh/knee strengthening with focus on functional strengthening in weightbearing, STM R piriformis/sacral border, SIJ work if needed   Consulted and Agree with Plan of Care Patient        Problem List Patient Active Problem List   Diagnosis Date Noted  . Hip pain, bilateral 08/19/2014  . Benign paroxysmal positional vertigo 04/23/2014  . Atrophic vaginitis 04/23/2014  . Neck pain 10/18/2013  . Diverticulitis 10/18/2013  . Abdominal pain, left lower quadrant 10/11/2013  . Mass of mouth 10/15/2012  . Atypical chest pain 12/26/2011  . Liver hemangioma 03/16/2011  . Osteopenia 11/17/2010  . General medical examination 10/25/2010  . 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 Low back pain, episodic 10/25/2010  . VAGINITIS, ATROPHIC, POSTMENOPAUSAL 05/05/2009  . OSTEOARTHRITIS 06/30/2008  . HYPERCHOLESTEROLEMIA 04/03/2008  . DECREASED HEARING, BILATERAL 04/03/2008    JoAnne M Kreis, PT, MPT 05/07/2015, 10:39 AM  [facility] [place] [address]  [address] [place], [place], [address] Phone: [phone]   Fax:  [phone]     

## 2015-05-12 ENCOUNTER — Ambulatory Visit: Payer: Medicare Other | Admitting: Physical Therapy

## 2015-05-12 DIAGNOSIS — R29898 Other symptoms and signs involving the musculoskeletal system: Secondary | ICD-10-CM

## 2015-05-12 DIAGNOSIS — M533 Sacrococcygeal disorders, not elsewhere classified: Secondary | ICD-10-CM | POA: Diagnosis not present

## 2015-05-12 DIAGNOSIS — R2681 Unsteadiness on feet: Secondary | ICD-10-CM

## 2015-05-12 DIAGNOSIS — M25552 Pain in left hip: Secondary | ICD-10-CM | POA: Diagnosis not present

## 2015-05-12 DIAGNOSIS — M25551 Pain in right hip: Secondary | ICD-10-CM | POA: Diagnosis not present

## 2015-05-12 NOTE — Therapy (Signed)
[facility] [address]  [place], [place], [address] Phone: [phone]   Fax:  [phone]  Physical Therapy Treatment  Patient Details  Name: Jocelyn Morgan MRN: [id number] Date of Birth: 05/29/1951 Referring Provider: Dene Gentry, MD  Encounter Date: 05/12/2015      PT End of Session - 05/12/15 0851    Visit Number 9   Number of Visits 10   Date for PT Re-Evaluation 05/21/15   PT Start Time 0848   PT Stop Time 0930   PT Time Calculation (min) 42 min   Activity Tolerance Patient tolerated treatment well   Behavior During Therapy [facility] for tasks assessed/performed      Past Medical History  Diagnosis Date  . Hyperlipidemia   . Arthritis     osteoarthritis  . Postmenopausal HRT (hormone replacement therapy)   . Liver hemangioma 03/16/2011    Past Surgical History  Procedure Laterality Date  . Tubal ligation    . Tubal ligation      There were no vitals filed for this visit.  Visit Diagnosis:  Sacral back pain  Leg weakness, bilateral  Unsteadiness on feet      Subjective Assessment - 05/12/15 0850    Subjective Patient reporting good relief of pain after manual therapy last visit, with only a little bit of soreness at present. Also reports adding a pillow as a lumbar support in chair and feels like this is helping as well.   Currently in Pain? No/denies            [facility] PT Assessment - 05/12/15 0848    Assessment   Referring Provider Dene Gentry, MD         Today's Treatment  TherEx Hamstring, piriformis, ITB stretches 3x20" LTR 10x5" Bridge with ball squeeze 20x5" Hooklying hip abduction clams with blue TB 20x3" (blue TB provided for home use, along with black TB for future progression) TRX squat with heel raise 15x3" Fwd SL step-ups to 8" step 10x3", bilateral, intermittent 1 pole A Lat SL step-ups to 8" step 10x3", bilateral, intermittent 1 pole A Lateral  eccentric reach down from 6" step with toe touch 10x3", 2 pole A         PT Education - 05/12/15 0939    Education provided Yes   Education Details Condensed/streamlined HEP   Person(s) Educated Patient   Methods Explanation;Demonstration;Handout   Comprehension Verbalized understanding;Returned demonstration;Need further instruction          PT Short Term Goals - 03/23/15 1001    PT SHORT TERM GOAL #1   Title pt will be independent with HEP by 8/29   Time 1   Period Days   Status Achieved           PT Long Term Goals - 05/12/15 1152    PT LONG TERM GOAL #1   Title pt will perform single leg stance on the right without left hip drop to demonstrate improve LE strength (05/21/15)   Status On-going   PT LONG TERM GOAL #2   Title pt will demonstrate a proper squat (05/21/15)   Status Achieved   PT LONG TERM GOAL #3   Title pt will improve LE MMT to at least 4/5 (05/21/15)   Status Partially Met   PT LONG TERM GOAL #4   Title pt will perform stairs at home without increased pain   Status Achieved  Plan - 05/12/15 1146    Clinical Impression Statement Patient reporting improvement since manual therapy on last visit with only mild soreness and no pain. Reassessed SIJ with no rotation noted today. Focused on streamlining/condensing HEP exercises to create a final HEP for patient to focus on for next week, with patient able to demonstrate all exercises appropriatelt. Will plan for discharge at next visit if pain remains well controlled and no concerns with HEP.   PT Next Visit Plan review HEP; discharge vs recert pending pain status and concerns with HEP        Problem List Patient Active Problem List   Diagnosis Date Noted  . Hip pain, bilateral 08/19/2014  . Benign paroxysmal positional vertigo 04/23/2014  . Atrophic vaginitis 04/23/2014  . Neck pain 10/18/2013  . Diverticulitis 10/18/2013  . Abdominal pain, left lower quadrant 10/11/2013  .  Mass of mouth 10/15/2012  . Atypical chest pain 12/26/2011  . Liver hemangioma 03/16/2011  . Osteopenia 11/17/2010  . General medical examination 10/25/2010  . 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 Low back pain, episodic 10/25/2010  . VAGINITIS, ATROPHIC, POSTMENOPAUSAL 05/05/2009  . OSTEOARTHRITIS 06/30/2008  . HYPERCHOLESTEROLEMIA 04/03/2008  . DECREASED HEARING, BILATERAL 04/03/2008    Percival Spanish, PT, MPT 05/12/2015, 11:59 AM  [facility] [address]  [place], [place], [address] Phone: [phone]   Fax:  [phone]  Name: Jocelyn Morgan MRN: [id number] Date of Birth: [birth year]

## 2015-05-21 ENCOUNTER — Ambulatory Visit: Payer: Medicare Other | Admitting: Physical Therapy

## 2015-05-21 DIAGNOSIS — M25551 Pain in right hip: Secondary | ICD-10-CM | POA: Diagnosis not present

## 2015-05-21 DIAGNOSIS — M533 Sacrococcygeal disorders, not elsewhere classified: Secondary | ICD-10-CM | POA: Diagnosis not present

## 2015-05-21 DIAGNOSIS — R2681 Unsteadiness on feet: Secondary | ICD-10-CM

## 2015-05-21 DIAGNOSIS — M25552 Pain in left hip: Secondary | ICD-10-CM | POA: Diagnosis not present

## 2015-05-21 DIAGNOSIS — R29898 Other symptoms and signs involving the musculoskeletal system: Secondary | ICD-10-CM

## 2015-05-21 NOTE — Therapy (Addendum)
[facility] [address]  [place], [place], [address] Phone: [phone]   Fax:  [phone]  Physical Therapy Treatment  Patient Details  Name: Jocelyn Morgan MRN: [id number] Date of Birth: 05/30/1951 Referring Provider: Dene Gentry, MD  Encounter Date: 05/21/2015      PT End of Session - 05/21/15 0858    Visit Number 10   Number of Visits 10   Date for PT Re-Evaluation 05/21/15   PT Start Time 0846   PT Stop Time 0926   PT Time Calculation (min) 40 min   Activity Tolerance Patient tolerated treatment well   Behavior During Therapy [facility] for tasks assessed/performed      Past Medical History  Diagnosis Date  . Hyperlipidemia   . Arthritis     osteoarthritis  . Postmenopausal HRT (hormone replacement therapy)   . Liver hemangioma 03/16/2011    Past Surgical History  Procedure Laterality Date  . Tubal ligation    . Tubal ligation      There were no vitals filed for this visit.  Visit Diagnosis:  Sacral back pain  Leg weakness, bilateral  Unsteadiness on feet  Hip pain, right  Hip pain, left      Subjective Assessment - 05/21/15 0850    Subjective Patient noticing some knee pain with exercises, primarily with squats, but otherwise doing well with exercises. Reports limited opportunity to work on HEP due to traveling and states pain today may be from sleeping on air matress while visiting with her daughter.   Currently in Pain? Yes   Pain Score --  1-2/10   Pain Location Hip  & Sacrum   Pain Orientation Right;Left;Lateral  Right side of sacrum            [facility] PT Assessment - 05/21/15 0846    Observation/Other Assessments   Focus on Therapeutic Outcomes (FOTO)  Hip: 82% (18% limitation)   Single Leg Stance   Comments no contralateral hip drop with SLS   Strength   Strength Assessment Site Hip;Knee   Right/Left Hip Right;Left   Right Hip Flexion 4+/5   Right Hip Extension  4-/5   Right Hip ABduction 4+/5   Right Hip ADduction 4/5   Left Hip Flexion 4+/5   Left Hip Extension 4/5   Left Hip ABduction 4+/5   Left Hip ADduction 4/5   Right/Left Knee Right;Left   Right Knee Flexion 4+/5   Right Knee Extension --  5-/5   Left Knee Flexion 4+/5   Left Knee Extension --  5-/5   Ambulation/Gait   Assistive device None   Gait Pattern Within Functional Limits   Stairs --   Stairs Assistance 7: Independent   Stair Management Technique No rails;Alternating pattern;Forwards   Number of Stairs 12   Height of Stairs 7           Today's Treatment  Bilateral LE MMT Gait/stair assessment  TherEx Review of HEP including modification based on response to exercises and progression as appropriate Squat with heel raise 15x3" Lateral eccentric reach down from 6" step with toe touch 10x3", bilateral, 2 pole A          PT Short Term Goals - 03/23/15 1001    PT SHORT TERM GOAL #1   Title pt will be independent with HEP by 8/29   Time 1   Period Days   Status Achieved  PT Long Term Goals - 2015-06-13 9323    PT LONG TERM GOAL #1   Title pt will perform single leg stance on the right without left hip drop to demonstrate improve LE strength (06/13/2015)   Status Achieved   PT LONG TERM GOAL #2   Title pt will demonstrate a proper squat (June 13, 2015)   Status Achieved   PT LONG TERM GOAL #3   Title pt will improve LE MMT to at least 4/5 (13-Jun-2015)   Status Achieved   PT LONG TERM GOAL #4   Title pt will perform stairs at home without increased pain   Status Achieved               Plan - 06/13/15 0929    Clinical Impression Statement Patient has demonstrated good progress with PT with only intermittent minimal pain (1-2/10) reported. Bilateral hip and knee strength improved to grossly 4/5 to 4+/5 with gluteus medius hip drop no longer present in SLS. Patient demonstrating improved awareness of proper LE body mechanics and is able to  incorporate good mechanics into exercises and functional movements including proper technique with squatting. Patient able to ascend and descend stairs reciprocally with normal step pattern, although does report fatigue with stairs late in day after several trips up/down. All goals met for this episode and patient is independent with self mobilization for SIJ pain and HEP including modifications based on pain or response to exercise and for progression of exercises. Patient is ready for discharge but expressed concern regarding what to do if symptoms exacerbate, therefore patient will be placed on hold for 30 days and instructed to call for appt if needs to return within the 30 day period.   PT Next Visit Plan Hold x 30 days, then proceed with D/C if no further issues   Consulted and Agree with Plan of Care Patient          G-Codes - 06/13/15 5573    Functional Assessment Tool Used FOTO = 82% (18% limitation)   Functional Limitation Mobility: Walking and moving around   Mobility: Walking and Moving Around Current Status [phone]) At least 1 percent but less than 20 percent impaired, limited or restricted   Mobility: Walking and Moving Around Goal Status [phone]) At least 20 percent but less than 40 percent impaired, limited or restricted   Mobility: Walking and Moving Around Discharge Status [phone]) At least 1 percent but less than 20 percent impaired, limited or restricted      Problem List Patient Active Problem List   Diagnosis Date Noted  . Hip pain, bilateral 08/19/2014  . Benign paroxysmal positional vertigo 04/23/2014  . Atrophic vaginitis 04/23/2014  . Neck pain 10/18/2013  . Diverticulitis 10/18/2013  . Abdominal pain, left lower quadrant 10/11/2013  . Mass of mouth 10/15/2012  . Atypical chest pain 12/26/2011  . Liver hemangioma 03/16/2011  . Osteopenia 11/17/2010  . General medical examination 10/25/2010  . 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 Low back pain,  episodic 10/25/2010  . VAGINITIS, ATROPHIC, POSTMENOPAUSAL 05/05/2009  . OSTEOARTHRITIS 06/30/2008  . HYPERCHOLESTEROLEMIA 04/03/2008  . DECREASED HEARING, BILATERAL 04/03/2008    Percival Spanish, PT, MPT Jun 13, 2015, 10:57 AM  [facility] [address]  [place], [place], [address] Phone: [phone]   Fax:  [phone]  Name: Jocelyn Morgan MRN: [id number] Date of Birth: 12/22/1950   PHYSICAL THERAPY DISCHARGE SUMMARY  Visits from Start of Care: 10  Current functional level related to goals / functional outcomes:  Patient demonstrated good progress with PT with only intermittent minimal pain (1-2/10) reported by end of therapy. Bilateral hip and knee strength improved to grossly 4/5 to 4+/5 with gluteus medius hip drop no longer present in SLS. Patient demonstrated improved awareness of proper LE body mechanics and was able to incorporate good mechanics into exercises and functional movements including proper technique with squatting. Patient able to ascend and descend stairs reciprocally with normal step pattern, although did report fatigue with stairs late in day after several trips up/down. All goals met for this episode and patient is independent with self mobilization for SIJ pain and HEP including modifications based on pain or response to exercise and for progression of exercises. Patient was placed on hold for 30 days to return in the event of an exacerbation of her symptoms but has not returned, therefore will proceed with discharge.    Remaining deficits:  Mild intermittent pain typically associated with fatigue   Education / Equipment:  HEP, self-mobilization for SIJ as needed   Plan: Patient agrees to discharge.  Patient goals were met. Patient is being discharged due to meeting the stated rehab goals.  ?????       Percival Spanish, PT, MPT 06/30/2015, 2:27 PM  [facility] [address]  [place], [place], [address] Phone: [phone]   Fax:  [phone]

## 2015-05-23 ENCOUNTER — Other Ambulatory Visit: Payer: Self-pay | Admitting: Family

## 2015-06-08 ENCOUNTER — Other Ambulatory Visit: Payer: Self-pay | Admitting: Family

## 2015-06-08 ENCOUNTER — Ambulatory Visit (HOSPITAL_BASED_OUTPATIENT_CLINIC_OR_DEPARTMENT_OTHER)
Admission: RE | Admit: 2015-06-08 | Discharge: 2015-06-08 | Disposition: A | Discharge status: Home / Self Care | Payer: Medicare Other | Source: Ambulatory Visit | Attending: Family | Admitting: Family

## 2015-06-08 DIAGNOSIS — Z1231 Encounter for screening mammogram for malignant neoplasm of breast: Secondary | ICD-10-CM | POA: Diagnosis not present

## 2015-06-08 DIAGNOSIS — Z1239 Encounter for other screening for malignant neoplasm of breast: Secondary | ICD-10-CM

## 2016-01-11 ENCOUNTER — Other Ambulatory Visit: Payer: Self-pay | Admitting: Family

## 2016-02-04 DIAGNOSIS — H2513 Age-related nuclear cataract, bilateral: Secondary | ICD-10-CM | POA: Diagnosis not present

## 2016-02-04 DIAGNOSIS — H5000 Unspecified esotropia: Secondary | ICD-10-CM | POA: Diagnosis not present

## 2016-02-04 DIAGNOSIS — H5203 Hypermetropia, bilateral: Secondary | ICD-10-CM | POA: Diagnosis not present

## 2016-02-04 DIAGNOSIS — H04123 Dry eye syndrome of bilateral lacrimal glands: Secondary | ICD-10-CM | POA: Diagnosis not present

## 2016-03-08 ENCOUNTER — Encounter: Payer: Self-pay | Admitting: Family

## 2016-03-11 ENCOUNTER — Encounter: Payer: Self-pay | Admitting: Family

## 2016-03-11 ENCOUNTER — Ambulatory Visit (INDEPENDENT_AMBULATORY_CARE_PROVIDER_SITE_OTHER): Payer: Medicare Other | Admitting: Family

## 2016-03-11 VITALS — BP 130/84 | HR 89 | Temp 98.3°F | Resp 16 | Ht 62.5 in | Wt 146.0 lb

## 2016-03-11 DIAGNOSIS — Z0000 Encounter for general adult medical examination without abnormal findings: Secondary | ICD-10-CM

## 2016-03-11 DIAGNOSIS — R1030 Lower abdominal pain, unspecified: Secondary | ICD-10-CM

## 2016-03-11 LAB — BASIC METABOLIC PANEL
BUN: 14 mg/dL (ref 7–25)
CALCIUM: 10 mg/dL (ref 8.6–10.4)
CO2: 25 mmol/L (ref 20–31)
Chloride: 103 mmol/L (ref 98–110)
Creat: 0.69 mg/dL (ref 0.50–0.99)
GLUCOSE: 97 mg/dL (ref 65–99)
Potassium: 3.9 mmol/L (ref 3.5–5.3)
Sodium: 142 mmol/L (ref 135–146)

## 2016-03-11 NOTE — Progress Notes (Signed)
Subjective:    Patient ID: Jocelyn Morgan, female    DOB: 1951-06-14, 65 y.o.   MRN: [id number]  HPI  [name] is a 65 yr old female who presents today with chief complaint of left sided abdominal pain.  Pain has been present x 1 month and is intermittent in nature. Pain radiates across her abdomen.  Notes that she has had intermittent constipation which is accompanied by pain.  She denies fever or brpbr. Has had some mucous in her stools. + hx of diverticulitis. No pain today.    Review of Systems See HPI  Past Medical History:  Diagnosis Date  . Arthritis    osteoarthritis  . Hyperlipidemia   . Liver hemangioma 03/16/2011  . Postmenopausal HRT (hormone replacement therapy)      Social History   Social History  . Marital status: Married    Spouse name: N/A  . Number of children: 4  . Years of education: N/A   Occupational History  . housewife    Social History Main Topics  . Smoking status: Never Smoker  . Smokeless tobacco: Never Used  . Alcohol use 0.6 oz/week    1 Glasses of wine per week  . Drug use: No  . Sexual activity: Yes   Other Topics Concern  . Not on file   Social History Narrative   Exercise: sometimes   Caffeine: 2 cups 1/2 and 1/2 coffee daily   5 grandchildren (none of her children are in [place])   Married             Past Surgical History:  Procedure Laterality Date  . TUBAL LIGATION    . TUBAL LIGATION      Family History  Problem Relation Age of Onset  . Cancer Mother 46    breast  . Arthritis Other   . Diabetes Other   . Cancer Other     bladder  . Ulcerative colitis Brother     No Known Allergies  Current Outpatient Prescriptions on File Prior to Visit  Medication Sig Dispense Refill  . aspirin 81 MG EC tablet Take 81 mg by mouth daily.      . Calcium-Vitamin D 500-125 MG-UNIT TABS Take 1 tablet by mouth daily.      . Misc Natural Products (OSTEO BI-FLEX ADV TRIPLE ST) TABS Take 1 tablet by mouth daily.      .  Multiple Vitamin (MULTIVITAMIN) tablet Take 1 tablet by mouth daily.      . Omega-3 Fatty Acids (FISH OIL) 1000 MG CAPS Take 2 capsules (2,000 mg total) by mouth 2 (two) times daily.  0  . pravastatin (PRAVACHOL) 20 MG tablet TAKE 1 TABLET (20 MG TOTAL) BY MOUTH DAILY. 30 tablet 5  . meloxicam (MOBIC) 7.5 MG tablet TAKE 1 TABLET (7.5 MG TOTAL) BY MOUTH DAILY AS NEEDED FOR PAIN. (Patient not taking: Reported on 03/11/2016) 30 tablet 1   No current facility-administered medications on file prior to visit.     BP 130/84   Pulse 89   Temp 98.3 F (36.8 C) (Oral)   Resp 16   Ht 5' 2.5" (1.588 m)   Wt 146 lb (66.2 kg)   LMP 07/25/1998   SpO2 97% Comment: room air  BMI 26.28 kg/m       Objective:   Physical Exam  Constitutional: She is oriented to person, place, and time. She appears well-developed and well-nourished.  HENT:  Head: Normocephalic and atraumatic.  Cardiovascular: Normal rate,  regular rhythm and normal heart sounds.   No murmur heard. Pulmonary/Chest: Effort normal and breath sounds normal. No respiratory distress. She has no wheezes.  Abdominal: Soft. Bowel sounds are normal. She exhibits no distension. There is no tenderness. There is no rebound.  Neurological: She is alert and oriented to person, place, and time.  Skin: Skin is warm and dry.  Psychiatric: She has a normal mood and affect. Her behavior is normal. Judgment and thought content normal.          Assessment & Plan:  Abdominal pain- intermittent x 1 month. No pain today. Will obtain bmet to assess kidney function.  She will return tomorrow for CT scan to further assess.

## 2016-03-11 NOTE — Patient Instructions (Addendum)
Please complete lab work prior to leaving. Go to the ER if you develop severe abdominal pain, or if you have rectal bleeding. Stop by imaging department to pick up contrast for your CT scan and they will give you a time for tomorrow.

## 2016-03-11 NOTE — Progress Notes (Signed)
Pre visit review using our clinic review tool, if applicable. No additional management support is needed unless otherwise documented below in the visit note. 

## 2016-03-11 NOTE — Addendum Note (Signed)
Addended by: Peggyann Shoals on: 03/11/2016 03:29 PM   Modules accepted: Orders

## 2016-03-12 ENCOUNTER — Encounter (HOSPITAL_BASED_OUTPATIENT_CLINIC_OR_DEPARTMENT_OTHER): Payer: Self-pay

## 2016-03-12 ENCOUNTER — Ambulatory Visit (HOSPITAL_BASED_OUTPATIENT_CLINIC_OR_DEPARTMENT_OTHER)
Admission: RE | Admit: 2016-03-12 | Discharge: 2016-03-12 | Disposition: A | Discharge status: Home / Self Care | Payer: Medicare Other | Source: Ambulatory Visit | Attending: Family | Admitting: Family

## 2016-03-12 DIAGNOSIS — K7689 Other specified diseases of liver: Secondary | ICD-10-CM | POA: Diagnosis not present

## 2016-03-12 DIAGNOSIS — K5730 Diverticulosis of large intestine without perforation or abscess without bleeding: Secondary | ICD-10-CM | POA: Diagnosis not present

## 2016-03-12 DIAGNOSIS — M419 Scoliosis, unspecified: Secondary | ICD-10-CM | POA: Insufficient documentation

## 2016-03-12 DIAGNOSIS — D1803 Hemangioma of intra-abdominal structures: Secondary | ICD-10-CM | POA: Diagnosis not present

## 2016-03-12 DIAGNOSIS — R1030 Lower abdominal pain, unspecified: Secondary | ICD-10-CM

## 2016-03-12 MED ORDER — IOPAMIDOL (ISOVUE-300) INJECTION 61%
100.0000 mL | Freq: Once | INTRAVENOUS | Status: AC | PRN
  Administered 2016-03-12: 100 mL via INTRAVENOUS

## 2016-03-13 ENCOUNTER — Encounter: Payer: Self-pay | Admitting: Family

## 2016-03-25 LAB — HM COLONOSCOPY

## 2016-04-13 DIAGNOSIS — Z86010 Personal history of colonic polyps: Secondary | ICD-10-CM | POA: Diagnosis not present

## 2016-04-13 DIAGNOSIS — Z8371 Family history of colonic polyps: Secondary | ICD-10-CM | POA: Diagnosis not present

## 2016-04-13 DIAGNOSIS — K5730 Diverticulosis of large intestine without perforation or abscess without bleeding: Secondary | ICD-10-CM | POA: Diagnosis not present

## 2016-04-13 LAB — HM COLONOSCOPY

## 2016-05-04 ENCOUNTER — Encounter: Payer: Self-pay | Admitting: Family

## 2016-05-04 ENCOUNTER — Ambulatory Visit (INDEPENDENT_AMBULATORY_CARE_PROVIDER_SITE_OTHER): Payer: Medicare Other | Admitting: Family

## 2016-05-04 VITALS — BP 121/74 | HR 75 | Temp 98.5°F | Resp 16 | Ht 62.0 in | Wt 143.2 lb

## 2016-05-04 DIAGNOSIS — E785 Hyperlipidemia, unspecified: Secondary | ICD-10-CM

## 2016-05-04 DIAGNOSIS — Z1159 Encounter for screening for other viral diseases: Secondary | ICD-10-CM | POA: Diagnosis not present

## 2016-05-04 DIAGNOSIS — Z23 Encounter for immunization: Secondary | ICD-10-CM | POA: Diagnosis not present

## 2016-05-04 DIAGNOSIS — Z1239 Encounter for other screening for malignant neoplasm of breast: Secondary | ICD-10-CM

## 2016-05-04 DIAGNOSIS — Z0000 Encounter for general adult medical examination without abnormal findings: Secondary | ICD-10-CM | POA: Diagnosis not present

## 2016-05-04 DIAGNOSIS — H9190 Unspecified hearing loss, unspecified ear: Secondary | ICD-10-CM

## 2016-05-04 DIAGNOSIS — M8580 Other specified disorders of bone density and structure, unspecified site: Secondary | ICD-10-CM

## 2016-05-04 LAB — LIPID PANEL
CHOL/HDL RATIO: 4
CHOLESTEROL: 164 mg/dL (ref 0–200)
HDL: 40.7 mg/dL (ref >39.00)
LDL CALC: 89 mg/dL (ref 0–99)
NONHDL: 123.25
Triglycerides: 171 mg/dL — ABNORMAL HIGH (ref 0.0–149.0)
VLDL: 34.2 mg/dL (ref 0.0–40.0)

## 2016-05-04 LAB — HEPATITIS C ANTIBODY: HCV AB: NEGATIVE

## 2016-05-04 NOTE — Patient Instructions (Signed)
Please complete lab work prior to leaving. Continue healthy diet and exercise.  Schedule mammogram and bone density on the first floor in imaging department.  Please fill out your health care power of attorney forms and bring [place] a copy for your chart.

## 2016-05-04 NOTE — Progress Notes (Signed)
Subjective:    Jocelyn Morgan is a 65 y.o. female who presents for Medicare Annual/Subsequent preventive examination.  Preventive Screening-Counseling & Management  Tobacco History  Smoking Status  . Never Smoker  Smokeless Tobacco  . Never Used     Problems Prior to Visit 1.  Osteopenia-  Maintained on calcium. Some exercise  2.  Osteoarthritis- has had issues with hip and back pain. Has some thumb pain, stable.  Back pain is stable.   3.  Hyperlipidemia- maintained on pravastatin.   Lab Results  Component Value Date   CHOL 186 04/28/2015   HDL 46.70 04/28/2015   LDLCALC 108 (H) 04/28/2015   LDLDIRECT 102.3 04/23/2014   TRIG 155.0 (H) 04/28/2015   CHOLHDL 4 04/28/2015    Current Problems (verified) Patient Active Problem List   Diagnosis Date Noted  . Hip pain, bilateral 08/19/2014  . Benign paroxysmal positional vertigo 04/23/2014  . Atrophic vaginitis 04/23/2014  . Neck pain 10/18/2013  . Diverticulitis 10/18/2013  . Abdominal pain, left lower quadrant 10/11/2013  . Mass of mouth 10/15/2012  . Atypical chest pain 12/26/2011  . Liver hemangioma 03/16/2011  . Osteopenia 11/17/2010  . General medical examination 10/25/2010  . 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 Low back pain, episodic 10/25/2010  . VAGINITIS, ATROPHIC, POSTMENOPAUSAL 05/05/2009  . OSTEOARTHRITIS 06/30/2008  . HYPERCHOLESTEROLEMIA 04/03/2008  . DECREASED HEARING, BILATERAL 04/03/2008    Medications Prior to Visit Current Outpatient Prescriptions on File Prior to Visit  Medication Sig Dispense Refill  . aspirin 81 MG EC tablet Take 81 mg by mouth daily.      . Calcium-Vitamin D 500-125 MG-UNIT TABS Take 1 tablet by mouth daily.      . meloxicam (MOBIC) 7.5 MG tablet TAKE 1 TABLET (7.5 MG TOTAL) BY MOUTH DAILY AS NEEDED FOR PAIN. (Patient not taking: Reported on 03/11/2016) 30 tablet 1  . Misc Natural Products (OSTEO BI-FLEX ADV TRIPLE ST) TABS Take 1 tablet by mouth daily.       . Multiple Vitamin (MULTIVITAMIN) tablet Take 1 tablet by mouth daily.      . Omega-3 Fatty Acids (FISH OIL) 1000 MG CAPS Take 2 capsules (2,000 mg total) by mouth 2 (two) times daily.  0  . pravastatin (PRAVACHOL) 20 MG tablet TAKE 1 TABLET (20 MG TOTAL) BY MOUTH DAILY. 30 tablet 5   No current facility-administered medications on file prior to visit.     Current Medications (verified) Current Outpatient Prescriptions  Medication Sig Dispense Refill  . aspirin 81 MG EC tablet Take 81 mg by mouth daily.      . Calcium-Vitamin D 500-125 MG-UNIT TABS Take 1 tablet by mouth daily.      . meloxicam (MOBIC) 7.5 MG tablet TAKE 1 TABLET (7.5 MG TOTAL) BY MOUTH DAILY AS NEEDED FOR PAIN. (Patient not taking: Reported on 03/11/2016) 30 tablet 1  . Misc Natural Products (OSTEO BI-FLEX ADV TRIPLE ST) TABS Take 1 tablet by mouth daily.      . Multiple Vitamin (MULTIVITAMIN) tablet Take 1 tablet by mouth daily.      . Omega-3 Fatty Acids (FISH OIL) 1000 MG CAPS Take 2 capsules (2,000 mg total) by mouth 2 (two) times daily.  0  . pravastatin (PRAVACHOL) 20 MG tablet TAKE 1 TABLET (20 MG TOTAL) BY MOUTH DAILY. 30 tablet 5   No current facility-administered medications for this visit.      Allergies (verified) Review of patient's allergies indicates no known allergies.  PAST HISTORY  Family History Family History  Problem Relation Age of Onset  . Cancer Mother 30    breast  . Arthritis Other   . Diabetes Other   . Cancer Other     bladder  . Ulcerative colitis Brother     Social History Social History  Substance Use Topics  . Smoking status: Never Smoker  . Smokeless tobacco: Never Used  . Alcohol use 0.6 oz/week    1 Glasses of wine per week     Are there smokers in your home (other than you)? No  Risk Factors Current exercise habits: continue regular exercise Dietary issues discussed: continue healthy diet.    Cardiac risk factors: advanced age (older than 52 for men, 23 for  women) and dyslipidemia.  Depression Screen (Note: if answer to either of the following is "Yes", a more complete depression screening is indicated)   Over the past two weeks, have you felt down, depressed or hopeless? No  Over the past two weeks, have you felt little interest or pleasure in doing things? No  Have you lost interest or pleasure in daily life? No  Do you often feel hopeless? No  Do you cry easily over simple problems? No  Activities of Daily Living In your present state of health, do you have any difficulty performing the following activities?:  Driving? No Managing money?  No Feeding yourself? No Getting from bed to chair? No . Climbing a flight of stairs? No Preparing food and eating?: No Bathing or showering? No Getting dressed: No Getting to the toilet? No Using the toilet:No Moving around from place to place: No In the past year have you fallen or had a near fall?:No   Are you sexually active?  No  Do you have more than one partner?  No  Hearing Difficulties: Yes Do you often ask people to speak up or repeat themselves? yes Do you experience ringing or noises in your ears? No Do you have difficulty understanding soft or whispered voices? Yes   Do you feel that you have a problem with memory? No  Do you often misplace items? No  Do you feel safe at home?  Yes  Cognitive Testing  Alert? Yes  Normal Appearance?Yes  Oriented to person? Yes  Place? Yes   Time? Yes  Recall of three objects?  Yes  Can perform simple calculations? Yes  Displays appropriate judgment?Yes  Can read the correct time from a watch face?Yes   Advanced Directives have been discussed with the patient? Yes  List the Names of Other Physician/Practitioners you currently use: 1.    Indicate any recent Medical Services you may have received from other than [facility] providers in the past year (date may be approximate).  Immunization History  Administered Date(s) Administered  .  Influenza Whole 05/05/2009  . Influenza, High Dose Seasonal PF 04/28/2015  . Influenza-Unspecified 04/24/2014  . Pneumococcal Conjugate-13 04/28/2015  . Td 03/15/2005, 04/28/2015  . Zoster 08/31/2010    Screening Tests Health Maintenance  Topic Date Due  . Hepatitis C Screening  June 29, 1951  . COLONOSCOPY  12/08/2015  . INFLUENZA VACCINE  02/23/2016  . PNA vac Low Risk Adult (2 of 2 - PPSV23) 04/27/2016  . MAMMOGRAM  06/07/2017  . TETANUS/TDAP  04/27/2025  . DEXA SCAN  Completed  . ZOSTAVAX  Completed    All answers were reviewed with the patient and necessary referrals were made:  O'SULLIVAN,Kandi Brusseau S., NP   05/04/2016   History reviewed:  allergies, current medications, past family history, past medical history, past social history, past surgical history and problem list  Review of Systems  Constitutional: Negative for unexpected weight change.  HENT: Positive for hearing loss. Negative for rhinorrhea.   Eyes: Negative for visual disturbance.  Respiratory: Negative for cough.   Cardiovascular: Negative for leg swelling.  Gastrointestinal: Negative for diarrhea.       Reports occasional constipation/diarrhea thinks she has mild ibs  Genitourinary: Negative for dysuria and frequency.  Musculoskeletal: Positive for arthralgias.  Neurological: Negative for headaches.  Hematological: Negative for adenopathy.  Psychiatric/Behavioral:       Denies anxiety or depression    Objective:     There is no height or weight on file to calculate BMI. LMP 07/25/1998    Physical Exam  Constitutional: She is oriented to person, place, and time. She appears well-developed and well-nourished. No distress.  HENT:  Head: Normocephalic and atraumatic.  Right Ear: Tympanic membrane and ear canal normal.  Left Ear: Tympanic membrane and ear canal normal.  Mouth/Throat: Oropharynx is clear and moist.  Eyes: Pupils are equal, round, and reactive to light. No scleral icterus.  Neck:  Normal range of motion. No thyromegaly present.  Cardiovascular: Normal rate and regular rhythm.   No murmur heard. Pulmonary/Chest: Effort normal and breath sounds normal. No respiratory distress. He has no wheezes. She has no rales. She exhibits no tenderness.  Abdominal: Soft. Bowel sounds are normal. She exhibits no distension and no mass. There is no tenderness. There is no rebound and no guarding.  Musculoskeletal: She exhibits no edema.  Lymphadenopathy:    She has no cervical adenopathy.  Neurological: She is alert and oriented to person, place, and time. She has normal patellar reflexes. She exhibits normal muscle tone. Coordination normal.  Skin: Skin is warm and dry.  Psychiatric: She has a normal mood and affect. Her behavior is normal. Judgment and thought content normal.  Breasts: Examined lying Right: Without masses, retractions, discharge or axillary adenopathy.  Left: Without masses, retractions, discharge or axillary adenopathy.     Assessment:          Plan:     During the course of the visit the patient was educated and counseled about appropriate screening and preventive services including:    Pneumococcal vaccine   Influenza vaccine  audiology referral  Diet review for nutrition referral? Yes ____  Not Indicated __x__   Patient Instructions (the written plan) was given to the patient.  Medicare Attestation I have personally reviewed: The patient's medical and social history Their use of alcohol, tobacco or illicit drugs Their current medications and supplements The patient's functional ability including ADLs,fall risks, home safety risks, cognitive, and hearing and visual impairment Diet and physical activities Evidence for depression or mood disorders  The patient's weight, height, BMI, and visual acuity have been recorded in the chart.  I have made referrals, counseling, and provided education to the patient based on review of the above and  I have provided the patient with a written personalized care plan for preventive services.     O'SULLIVAN,Francine Hannan S., NP   05/04/2016

## 2016-05-04 NOTE — Addendum Note (Signed)
Addended by: Kelle Darting A on: 05/04/2016 11:58 AM   Modules accepted: Orders

## 2016-05-04 NOTE — Progress Notes (Signed)
Pre visit review using our clinic review tool, if applicable. No additional management support is needed unless otherwise documented below in the visit note. 

## 2016-05-04 NOTE — Progress Notes (Signed)
Subjective:    Patient ID: Jocelyn Morgan, female    DOB: 1950-10-25, 65 y.o.   MRN: [id number]  HPI  Patient presents today for complete physical.  Immunizations: pneumovax today.  Diet: healthy Exercise:  Some walking Colonoscopy: 2012 Dexa: 9/14 Pap Smear: 9/15- negative Mammogram: due  Wt Readings from Last 3 Encounters:  05/04/16 143 lb 3.2 oz (65 kg)  03/11/16 146 lb (66.2 kg)  04/28/15 145 lb 6.4 oz (66 kg)       Review of Systems  Constitutional: Negative for unexpected weight change.  HENT: Positive for hearing loss. Negative for rhinorrhea.   Eyes: Negative for visual disturbance.  Respiratory: Negative for cough.   Cardiovascular: Negative for leg swelling.  Gastrointestinal: Negative for diarrhea.       Reports occasional constipation/diarrhea thinks she has mild ibs  Genitourinary: Negative for dysuria and frequency.  Musculoskeletal: Positive for arthralgias.  Neurological: Negative for headaches.  Hematological: Negative for adenopathy.  Psychiatric/Behavioral:       Denies anxiety or depression       Past Medical History:  Diagnosis Date  . Arthritis    osteoarthritis  . Hyperlipidemia   . Liver hemangioma 03/16/2011  . Postmenopausal HRT (hormone replacement therapy)      Social History   Social History  . Marital status: Married    Spouse name: N/A  . Number of children: 4  . Years of education: N/A   Occupational History  . housewife    Social History Main Topics  . Smoking status: Never Smoker  . Smokeless tobacco: Never Used  . Alcohol use 0.6 oz/week    1 Glasses of wine per week  . Drug use: No  . Sexual activity: Yes   Other Topics Concern  . Not on file   Social History Narrative   Exercise: sometimes   Caffeine: 2 cups 1/2 and 1/2 coffee daily   5 grandchildren (none of her children are in [place])   Married             Past Surgical History:  Procedure Laterality Date  . TUBAL LIGATION    . TUBAL LIGATION       Family History  Problem Relation Age of Onset  . Ulcerative colitis Brother   . Cancer Mother 89    breast  . Arthritis Other   . Diabetes Other   . Cancer Other     bladder  . Cancer Cousin 65    Stage 3 breast cancer    No Known Allergies  Current Outpatient Prescriptions on File Prior to Visit  Medication Sig Dispense Refill  . aspirin 81 MG EC tablet Take 81 mg by mouth daily.      . Calcium-Vitamin D 500-125 MG-UNIT TABS Take 1 tablet by mouth daily.      . Misc Natural Products (OSTEO BI-FLEX ADV TRIPLE ST) TABS Take 1 tablet by mouth daily.      . Multiple Vitamin (MULTIVITAMIN) tablet Take 1 tablet by mouth daily.      . Omega-3 Fatty Acids (FISH OIL) 1000 MG CAPS Take 2 capsules (2,000 mg total) by mouth 2 (two) times daily.  0  . pravastatin (PRAVACHOL) 20 MG tablet TAKE 1 TABLET (20 MG TOTAL) BY MOUTH DAILY. 30 tablet 5   No current facility-administered medications on file prior to visit.     BP 121/74 (BP Location: Right Arm, Cuff Size: Normal)   Pulse 75   Temp 98.5 F (36.9 C) (Oral)  Resp 16   Ht 5\' 2"  (1.575 m)   Wt 143 lb 3.2 oz (65 kg)   LMP 07/25/1998   SpO2 100% Comment: room air  BMI 26.19 kg/m    Objective:   Physical Exam  Physical Exam  Constitutional: She is oriented to person, place, and time. She appears well-developed and well-nourished. No distress.  HENT:  Head: Normocephalic and atraumatic.  Right Ear: Tympanic membrane and ear canal normal.  Left Ear: Tympanic membrane and ear canal normal.  Mouth/Throat: Oropharynx is clear and moist.  Eyes: Pupils are equal, round, and reactive to light. No scleral icterus.  Neck: Normal range of motion. No thyromegaly present.  Cardiovascular: Normal rate and regular rhythm.   No murmur heard. Pulmonary/Chest: Effort normal and breath sounds normal. No respiratory distress. He has no wheezes. She has no rales. She exhibits no tenderness.  Abdominal: Soft. Bowel sounds are normal. She  exhibits no distension and no mass. There is no tenderness. There is no rebound and no guarding.  Musculoskeletal: She exhibits no edema.  Lymphadenopathy:    She has no cervical adenopathy.  Neurological: She is alert and oriented to person, place, and time. She has normal patellar reflexes. She exhibits normal muscle tone. Coordination normal.  Skin: Skin is warm and dry.  Psychiatric: She has a normal mood and affect. Her behavior is normal. Judgment and thought content normal.  Breasts: Examined lying Right: Without masses, retractions, discharge or axillary adenopathy.  Left: Without masses, retractions, discharge or axillary adenopathy.          Assessment & Plan:         Assessment & Plan:  Preventative Care- continue healthy diet, exercise. Check follow up cholesterol. Refer for dexa, mammogram.  Flu and pneumovax today.

## 2016-05-11 ENCOUNTER — Other Ambulatory Visit: Payer: Self-pay | Admitting: Family

## 2016-06-09 ENCOUNTER — Ambulatory Visit (HOSPITAL_BASED_OUTPATIENT_CLINIC_OR_DEPARTMENT_OTHER)
Admission: RE | Admit: 2016-06-09 | Discharge: 2016-06-09 | Disposition: A | Discharge status: Home / Self Care | Payer: Medicare Other | Source: Ambulatory Visit | Attending: Family | Admitting: Family

## 2016-06-09 DIAGNOSIS — Z1239 Encounter for other screening for malignant neoplasm of breast: Secondary | ICD-10-CM

## 2016-06-09 DIAGNOSIS — M8580 Other specified disorders of bone density and structure, unspecified site: Secondary | ICD-10-CM

## 2016-06-09 DIAGNOSIS — M85852 Other specified disorders of bone density and structure, left thigh: Secondary | ICD-10-CM | POA: Insufficient documentation

## 2016-06-09 DIAGNOSIS — M85832 Other specified disorders of bone density and structure, left forearm: Secondary | ICD-10-CM | POA: Diagnosis not present

## 2016-06-09 DIAGNOSIS — E2839 Other primary ovarian failure: Secondary | ICD-10-CM | POA: Diagnosis present

## 2016-06-09 DIAGNOSIS — Z1231 Encounter for screening mammogram for malignant neoplasm of breast: Secondary | ICD-10-CM | POA: Insufficient documentation

## 2016-06-12 ENCOUNTER — Encounter: Payer: Self-pay | Admitting: Family

## 2016-06-12 ENCOUNTER — Other Ambulatory Visit: Payer: Self-pay | Admitting: Family

## 2016-06-12 MED ORDER — CALCIUM CARBONATE-VITAMIN D 600-400 MG-UNIT PO TABS: 1.0000 | ORAL_TABLET | Freq: Two times a day (BID) | ORAL | Status: DC

## 2016-06-13 ENCOUNTER — Encounter: Payer: Self-pay | Admitting: Family

## 2016-08-25 DIAGNOSIS — R69 Illness, unspecified: Secondary | ICD-10-CM | POA: Diagnosis not present

## 2016-11-07 ENCOUNTER — Other Ambulatory Visit: Payer: Self-pay | Admitting: Family

## 2016-11-07 NOTE — Telephone Encounter (Signed)
eScribe request from [facility] for refill on Pravastatin 20 mg Last filled - 05/11/16, #90x1 Last AEX - 05/04/16 Next AEX - 1 Yr Refill sent per [facility] refill protocol/SLS

## 2017-02-23 DIAGNOSIS — H524 Presbyopia: Secondary | ICD-10-CM | POA: Diagnosis not present

## 2017-02-27 DIAGNOSIS — R69 Illness, unspecified: Secondary | ICD-10-CM | POA: Diagnosis not present

## 2017-04-24 ENCOUNTER — Other Ambulatory Visit: Payer: Self-pay | Admitting: Family

## 2017-04-24 NOTE — Telephone Encounter (Signed)
Rx approved and sent to the pharmacy by e-script.//AB/CMA 

## 2017-05-10 ENCOUNTER — Encounter: Payer: Self-pay | Admitting: Family

## 2017-05-10 ENCOUNTER — Ambulatory Visit (INDEPENDENT_AMBULATORY_CARE_PROVIDER_SITE_OTHER): Payer: Medicare HMO | Admitting: Family

## 2017-05-10 VITALS — BP 125/70 | HR 81 | Temp 98.4°F | Resp 16 | Ht 62.0 in | Wt 135.4 lb

## 2017-05-10 DIAGNOSIS — Z0000 Encounter for general adult medical examination without abnormal findings: Secondary | ICD-10-CM

## 2017-05-10 DIAGNOSIS — R5383 Other fatigue: Secondary | ICD-10-CM | POA: Diagnosis not present

## 2017-05-10 DIAGNOSIS — Z23 Encounter for immunization: Secondary | ICD-10-CM | POA: Diagnosis not present

## 2017-05-10 DIAGNOSIS — Z1231 Encounter for screening mammogram for malignant neoplasm of breast: Secondary | ICD-10-CM

## 2017-05-10 DIAGNOSIS — Z1239 Encounter for other screening for malignant neoplasm of breast: Secondary | ICD-10-CM

## 2017-05-10 LAB — CBC WITH DIFFERENTIAL/PLATELET
BASOS ABS: 0 10*3/uL (ref 0.0–0.1)
Basophils Relative: 0.7 % (ref 0.0–3.0)
EOS ABS: 0.2 10*3/uL (ref 0.0–0.7)
Eosinophils Relative: 3 % (ref 0.0–5.0)
HEMATOCRIT: 42 % (ref 36.0–46.0)
Hemoglobin: 13.8 g/dL (ref 12.0–15.0)
LYMPHS PCT: 24.6 % (ref 12.0–46.0)
Lymphs Abs: 1.5 10*3/uL (ref 0.7–4.0)
MCHC: 32.9 g/dL (ref 30.0–36.0)
MCV: 94.6 fl (ref 78.0–100.0)
Monocytes Absolute: 0.4 10*3/uL (ref 0.1–1.0)
Monocytes Relative: 7.4 % (ref 3.0–12.0)
NEUTROS ABS: 3.8 10*3/uL (ref 1.4–7.7)
Neutrophils Relative %: 64.3 % (ref 43.0–77.0)
PLATELETS: 378 10*3/uL (ref 150.0–400.0)
RBC: 4.44 Mil/uL (ref 3.87–5.11)
RDW: 13.4 % (ref 11.5–15.5)
WBC: 5.9 10*3/uL (ref 4.0–10.5)

## 2017-05-10 LAB — COMPREHENSIVE METABOLIC PANEL
ALBUMIN: 4.8 g/dL (ref 3.5–5.2)
ALT: 13 U/L (ref 0–35)
AST: 16 U/L (ref 0–37)
Alkaline Phosphatase: 44 U/L (ref 39–117)
BILIRUBIN TOTAL: 0.5 mg/dL (ref 0.2–1.2)
BUN: 15 mg/dL (ref 6–23)
CALCIUM: 10.3 mg/dL (ref 8.4–10.5)
CO2: 28 meq/L (ref 19–32)
CREATININE: 0.76 mg/dL (ref 0.40–1.20)
Chloride: 105 mEq/L (ref 96–112)
GFR: 80.6 mL/min (ref >60.00)
Glucose, Bld: 111 mg/dL — ABNORMAL HIGH (ref 70–99)
Potassium: 4.8 mEq/L (ref 3.5–5.1)
Sodium: 142 mEq/L (ref 135–145)
TOTAL PROTEIN: 7.5 g/dL (ref 6.0–8.3)

## 2017-05-10 LAB — TSH: TSH: 3.66 u[IU]/mL (ref 0.35–4.50)

## 2017-05-10 MED ORDER — ZOSTER VAC RECOMB ADJUVANTED 50 MCG/0.5ML IM SUSR: 0.5000 mL | Freq: Once | INTRAMUSCULAR | 1 refills | Status: AC

## 2017-05-10 NOTE — Progress Notes (Signed)
Subjective:    Patient ID: Jocelyn Morgan, female    DOB: August 09, 1950, 66 y.o.   MRN: [id number]  HPI  Jocelyn Morgan is a 66 yr old female who presents today for a complete physical.    Immunizations: tdap 04/28/15 Diet: reports that she is eating well Wt Readings from Last 3 Encounters:  05/10/17 135 lb 6.4 oz (61.4 kg)  05/04/16 143 lb 3.2 oz (65 kg)  03/11/16 146 lb (66.2 kg)  Exercise:some exercise, needs more Colonoscopy:  03/25/16 Dexa: 06/09/16 Pap Smear: N/A Mammogram: 06/13/16 Vision: 8/18 Dental:  Up to date   She reports that she has had fatigue.  Review of Systems  Constitutional: Negative for unexpected weight change.  HENT:       Reports sore throat on/off  Respiratory: Negative for cough.   Cardiovascular: Negative for leg swelling.  Gastrointestinal: Negative for blood in stool, constipation and diarrhea.  Genitourinary: Negative for dysuria, frequency and hematuria.  Musculoskeletal: Negative for arthralgias and myalgias.  Neurological: Negative for headaches.  Hematological: Negative for adenopathy.  Psychiatric/Behavioral:       Denies depression/anxiety       Past Medical History:  Diagnosis Date  . Arthritis    osteoarthritis  . Hyperlipidemia   . Liver hemangioma 03/16/2011  . Postmenopausal HRT (hormone replacement therapy)      Social History   Social History  . Marital status: Married    Spouse name: N/A  . Number of children: 4  . Years of education: N/A   Occupational History  . housewife    Social History Main Topics  . Smoking status: Never Smoker  . Smokeless tobacco: Never Used  . Alcohol use 0.6 - 1.2 oz/week    1 - 2 Glasses of wine per week  . Drug use: No  . Sexual activity: Yes   Other Topics Concern  . Not on file   Social History Narrative   Exercise: sometimes   Caffeine: 2 cups 1/2 and 1/2 coffee daily   6 grandchildren (none of her children are in [place])   Married             Past Surgical History:    Procedure Laterality Date  . TUBAL LIGATION    . TUBAL LIGATION      Family History  Problem Relation Age of Onset  . Ulcerative colitis Brother   . Cancer Mother 63       breast  . Arthritis Other   . Diabetes Other   . Cancer Other        bladder  . Cancer Cousin 65       Stage 3 breast cancer  . COPD Father        lived to be 75  . Bladder Cancer Father     No Known Allergies  Current Outpatient Prescriptions on File Prior to Visit  Medication Sig Dispense Refill  . aspirin 81 MG EC tablet Take 81 mg by mouth daily.      . Calcium Carbonate-Vitamin D 600-400 MG-UNIT tablet Take 1 tablet by mouth 2 (two) times daily.    . Misc Natural Products (OSTEO BI-FLEX ADV TRIPLE ST) TABS Take 1 tablet by mouth daily.      . Multiple Vitamin (MULTIVITAMIN) tablet Take 1 tablet by mouth daily.      . Omega-3 Fatty Acids (FISH OIL) 1000 MG CAPS Take 2 capsules (2,000 mg total) by mouth 2 (two) times daily. (Patient taking differently: Take 2,000 mg  by mouth daily. )  0  . pravastatin (PRAVACHOL) 20 MG tablet TAKE ONE TABLET BY MOUTH DAILY 90 tablet 1   No current facility-administered medications on file prior to visit.     BP 125/70 (BP Location: Right Arm, Cuff Size: Normal)   Pulse 81   Temp 98.4 F (36.9 C) (Oral)   Resp 16   Ht 5\' 2"  (1.575 m)   Wt 135 lb 6.4 oz (61.4 kg)   LMP 07/25/1998   SpO2 98%   BMI 24.76 kg/m    Objective:   Physical Exam Physical Exam  Constitutional: She is oriented to person, place, and time. She appears well-developed and well-nourished. No distress.  HENT:  Head: Normocephalic and atraumatic.  Right Ear: Tympanic membrane and ear canal normal.  Left Ear: Tympanic membrane and ear canal normal.  Mouth/Throat: Oropharynx is clear and moist.  Eyes: Pupils are equal, round, and reactive to light. No scleral icterus.  Neck: Normal range of motion. No thyromegaly present.  Cardiovascular: Normal rate and regular rhythm.   No murmur  heard. Pulmonary/Chest: Effort normal and breath sounds normal. No respiratory distress. He has no wheezes. She has no rales. She exhibits no tenderness.  Abdominal: Soft. Bowel sounds are normal. She exhibits no distension and no mass. There is no tenderness. There is no rebound and no guarding.  Musculoskeletal: She exhibits no edema.  Lymphadenopathy:    She has no cervical adenopathy.  Neurological: She is alert and oriented to person, place, and time. She has normal patellar reflexes. She exhibits normal muscle tone. Coordination normal.  Skin: Skin is warm and dry.  Psychiatric: She has a normal mood and affect. Her behavior is normal. Judgment and thought content normal.  Breasts: Examined lying Right: Without masses, retractions, discharge or axillary adenopathy.  Left: Without masses, retractions, discharge or axillary adenopathy.  Pelvic: deferred          Assessment & Plan:          Assessment & Plan:  Preventative care- continue healthy diet, add regular exercise. Obtain lab work as ordered. Mammo, dexa, colo up to date. Flu shot given today.

## 2017-05-10 NOTE — Addendum Note (Signed)
Addended by: Kelle Darting A on: 05/10/2017 09:07 AM   Modules accepted: Orders

## 2017-05-10 NOTE — Patient Instructions (Addendum)
Please complete lab work prior to leaving.  Great job on the weight loss! Try to add in regular exercise.

## 2017-05-11 ENCOUNTER — Encounter: Payer: Self-pay | Admitting: Family

## 2017-05-12 ENCOUNTER — Encounter: Payer: Self-pay | Admitting: Family

## 2017-05-14 ENCOUNTER — Encounter: Payer: Self-pay | Admitting: Family

## 2017-05-16 ENCOUNTER — Encounter: Payer: Self-pay | Admitting: Family

## 2017-05-16 NOTE — Telephone Encounter (Signed)
Results mailed 

## 2017-05-20 ENCOUNTER — Encounter: Payer: Self-pay | Admitting: Family

## 2017-05-22 ENCOUNTER — Encounter: Payer: Self-pay | Admitting: Family

## 2017-05-22 DIAGNOSIS — E7800 Pure hypercholesterolemia, unspecified: Secondary | ICD-10-CM

## 2017-05-23 ENCOUNTER — Encounter: Payer: Self-pay | Admitting: Family

## 2017-05-31 ENCOUNTER — Encounter: Payer: Self-pay | Admitting: Family

## 2017-05-31 ENCOUNTER — Other Ambulatory Visit (INDEPENDENT_AMBULATORY_CARE_PROVIDER_SITE_OTHER): Payer: Medicare HMO

## 2017-05-31 DIAGNOSIS — E7800 Pure hypercholesterolemia, unspecified: Secondary | ICD-10-CM | POA: Diagnosis not present

## 2017-05-31 LAB — LIPID PANEL
CHOLESTEROL: 185 mg/dL (ref 0–200)
HDL: 45.1 mg/dL (ref >39.00)
LDL Cholesterol: 107 mg/dL — ABNORMAL HIGH (ref 0–99)
NONHDL: 139.42
Total CHOL/HDL Ratio: 4
Triglycerides: 161 mg/dL — ABNORMAL HIGH (ref 0.0–149.0)
VLDL: 32.2 mg/dL (ref 0.0–40.0)

## 2017-06-12 ENCOUNTER — Ambulatory Visit (HOSPITAL_BASED_OUTPATIENT_CLINIC_OR_DEPARTMENT_OTHER)
Admission: RE | Admit: 2017-06-12 | Discharge: 2017-06-12 | Disposition: A | Discharge status: Home / Self Care | Payer: Medicare HMO | Source: Ambulatory Visit | Attending: Family | Admitting: Family

## 2017-06-12 DIAGNOSIS — Z1231 Encounter for screening mammogram for malignant neoplasm of breast: Secondary | ICD-10-CM | POA: Insufficient documentation

## 2017-06-12 DIAGNOSIS — Z1239 Encounter for other screening for malignant neoplasm of breast: Secondary | ICD-10-CM

## 2017-06-22 DIAGNOSIS — Z0100 Encounter for examination of eyes and vision without abnormal findings: Secondary | ICD-10-CM | POA: Diagnosis not present

## 2017-06-22 DIAGNOSIS — H11152 Pinguecula, left eye: Secondary | ICD-10-CM | POA: Diagnosis not present

## 2017-06-22 DIAGNOSIS — H53031 Strabismic amblyopia, right eye: Secondary | ICD-10-CM | POA: Diagnosis not present

## 2017-06-22 DIAGNOSIS — H2513 Age-related nuclear cataract, bilateral: Secondary | ICD-10-CM | POA: Diagnosis not present

## 2017-06-22 DIAGNOSIS — H04123 Dry eye syndrome of bilateral lacrimal glands: Secondary | ICD-10-CM | POA: Diagnosis not present

## 2017-10-02 ENCOUNTER — Encounter: Payer: Self-pay | Admitting: Family

## 2017-10-02 ENCOUNTER — Ambulatory Visit (INDEPENDENT_AMBULATORY_CARE_PROVIDER_SITE_OTHER): Payer: Medicare HMO | Admitting: Family

## 2017-10-02 VITALS — BP 138/70 | HR 95 | Temp 98.4°F | Resp 16 | Ht 62.0 in | Wt 138.0 lb

## 2017-10-02 DIAGNOSIS — M5416 Radiculopathy, lumbar region: Secondary | ICD-10-CM

## 2017-10-02 DIAGNOSIS — R69 Illness, unspecified: Secondary | ICD-10-CM | POA: Diagnosis not present

## 2017-10-02 MED ORDER — MELOXICAM 7.5 MG PO TABS: 7.5000 mg | ORAL_TABLET | Freq: Every day | ORAL | 0 refills | Status: DC

## 2017-10-02 NOTE — Patient Instructions (Signed)
Start meloxicam for your hip/leg pain.  I think that this is related to a pinched nerve in your back. Call if new or worsening symptoms or if not improved in 1 month.

## 2017-10-02 NOTE — Progress Notes (Signed)
Subjective:    Patient ID: Jocelyn Morgan, female    DOB: 04-Aug-1950, 67 y.o.   MRN: [id number]  HPI  Pt is a 67 yr old female who presents today with chief complaint of pain radiating into the right hip since January. Reports that in February she began to develop pain radiating into the right knee.   She saw Dr. Barbaraann Barthel a few years ago for right hip pain. Reports that she restarted the exercises in January without improvement.  Reports that pain shoots from the right hip down into the knee and sometimes down into the foot. Reports that she has pain with standing. Has one area in the lower back. Stairs and standing worsen the pain. Does have some mild low back pain.   Review of Systems    see HPI  Past Medical History:  Diagnosis Date  . Arthritis    osteoarthritis  . Hyperlipidemia   . Liver hemangioma 03/16/2011  . Postmenopausal HRT (hormone replacement therapy)      Social History   Socioeconomic History  . Marital status: Married    Spouse name: Not on file  . Number of children: 4  . Years of education: Not on file  . Highest education level: Not on file  Social Needs  . Financial resource strain: Not on file  . Food insecurity - worry: Not on file  . Food insecurity - inability: Not on file  . Transportation needs - medical: Not on file  . Transportation needs - non-medical: Not on file  Occupational History  . Occupation: housewife  Tobacco Use  . Smoking status: Never Smoker  . Smokeless tobacco: Never Used  Substance and Sexual Activity  . Alcohol use: Yes    Alcohol/week: 0.6 - 1.2 oz    Types: 1 - 2 Glasses of wine per week  . Drug use: No  . Sexual activity: Yes  Other Topics Concern  . Not on file  Social History Narrative   Exercise: sometimes   Caffeine: 2 cups 1/2 and 1/2 coffee daily   6 grandchildren (none of her children are in [place])   Married          Past Surgical History:  Procedure Laterality Date  . TUBAL LIGATION    . TUBAL  LIGATION      Family History  Problem Relation Age of Onset  . Ulcerative colitis Brother   . Cancer Mother 60       breast  . Arthritis Other   . Diabetes Other   . Cancer Other        bladder  . Cancer Cousin 65       Stage 3 breast cancer  . COPD Father        lived to be 9  . Bladder Cancer Father     No Known Allergies  Current Outpatient Medications on File Prior to Visit  Medication Sig Dispense Refill  . aspirin 81 MG EC tablet Take 81 mg by mouth daily.      . Calcium Carbonate-Vitamin D 600-400 MG-UNIT tablet Take 1 tablet by mouth 2 (two) times daily.    . Misc Natural Products (OSTEO BI-FLEX ADV TRIPLE ST) TABS Take 1 tablet by mouth daily.      . Multiple Vitamin (MULTIVITAMIN) tablet Take 1 tablet by mouth daily.      . Omega-3 Fatty Acids (FISH OIL) 1000 MG CAPS Take 2 capsules (2,000 mg total) by mouth 2 (two) times daily. (Patient taking  differently: Take 2,000 mg by mouth daily. )  0  . pravastatin (PRAVACHOL) 20 MG tablet TAKE ONE TABLET BY MOUTH DAILY 90 tablet 1  . Wheat Dextrin (BENEFIBER PO) Take 2 teaspoons once a day.     No current facility-administered medications on file prior to visit.     BP 138/70 (BP Location: Right Arm, Patient Position: Sitting, Cuff Size: Small)   Pulse 95   Temp 98.4 F (36.9 C) (Oral)   Resp 16   Ht 5\' 2"  (1.575 m)   Wt 138 lb (62.6 kg)   LMP 07/25/1998   SpO2 98%   BMI 25.24 kg/m    Objective:   Physical Exam  Constitutional: She is oriented to person, place, and time. She appears well-developed and well-nourished.  Cardiovascular: Normal rate, regular rhythm and normal heart sounds.  No murmur heard. Pulmonary/Chest: Effort normal and breath sounds normal. No respiratory distress. She has no wheezes.  Musculoskeletal: She exhibits no edema.       Cervical back: She exhibits no tenderness.       Thoracic back: She exhibits no tenderness.       Lumbar back: She exhibits tenderness.  Neurological: She is  alert and oriented to person, place, and time.  Reflex Scores:      Patellar reflexes are 2+ on the right side and 2+ on the left side. Bilateral LE strength is 5/5  Psychiatric: She has a normal mood and affect. Her behavior is normal. Judgment and thought content normal.          Assessment & Plan:  Lumbar radiculopathy- following right L4-5 dermatome. Advised pt as follows:  Start meloxicam for your hip/leg pain.  I think that this is related to a pinched nerve in your back. Call if new or worsening symptoms or if not improved in 1 month.

## 2017-11-10 ENCOUNTER — Other Ambulatory Visit: Payer: Self-pay | Admitting: Family

## 2018-01-08 ENCOUNTER — Encounter: Payer: Self-pay | Admitting: Family

## 2018-01-08 ENCOUNTER — Ambulatory Visit (INDEPENDENT_AMBULATORY_CARE_PROVIDER_SITE_OTHER): Payer: Medicare HMO | Admitting: Family

## 2018-01-08 VITALS — BP 130/73 | HR 97 | Temp 98.9°F | Resp 16 | Ht 62.0 in | Wt 136.2 lb

## 2018-01-08 DIAGNOSIS — H1032 Unspecified acute conjunctivitis, left eye: Secondary | ICD-10-CM | POA: Diagnosis not present

## 2018-01-08 DIAGNOSIS — J029 Acute pharyngitis, unspecified: Secondary | ICD-10-CM

## 2018-01-08 DIAGNOSIS — H65192 Other acute nonsuppurative otitis media, left ear: Secondary | ICD-10-CM | POA: Diagnosis not present

## 2018-01-08 LAB — POCT RAPID STREP A (OFFICE): Rapid Strep A Screen: NEGATIVE

## 2018-01-08 MED ORDER — CIPROFLOXACIN HCL 0.3 % OP SOLN: OPHTHALMIC | 0 refills | Status: DC

## 2018-01-08 MED ORDER — NEOMYCIN-POLYMYXIN-HC 3.5-10000-1 OP SUSP: 3.0000 [drp] | Freq: Four times a day (QID) | OPHTHALMIC | 0 refills | Status: DC

## 2018-01-08 MED ORDER — AMOXICILLIN 500 MG PO CAPS: 500.0000 mg | ORAL_CAPSULE | Freq: Three times a day (TID) | ORAL | 0 refills | Status: DC

## 2018-01-08 NOTE — Progress Notes (Signed)
Subjective:    Patient ID: Jocelyn Morgan, female    DOB: [birth year], 67 y.o.   MRN: [id number]  HPI  Patient is a 67 yr old female who presents today with chief complaint of cough, nasal/head congestion. Symptoms started last Wednesday and are associated with sore throat and fever.  Using advil/dayquil/nyquil without improvement.  Woke up with left eye matted together. Reports low grade fever around 100 in the evenings. No known sick contacts.   Review of Systems See HPI  Past Medical History:  Diagnosis Date  . Arthritis    osteoarthritis  . Hyperlipidemia   . Liver hemangioma 03/16/2011  . Postmenopausal HRT (hormone replacement therapy)      Social History   Socioeconomic History  . Marital status: Married    Spouse name: Not on file  . Number of children: 4  . Years of education: Not on file  . Highest education level: Not on file  Occupational History  . Occupation: housewife  Social Needs  . Financial resource strain: Not on file  . Food insecurity:    Worry: Not on file    Inability: Not on file  . Transportation needs:    Medical: Not on file    Non-medical: Not on file  Tobacco Use  . Smoking status: Never Smoker  . Smokeless tobacco: Never Used  Substance and Sexual Activity  . Alcohol use: Yes    Alcohol/week: 0.6 - 1.2 oz    Types: 1 - 2 Glasses of wine per week  . Drug use: No  . Sexual activity: Yes  Lifestyle  . Physical activity:    Days per week: Not on file    Minutes per session: Not on file  . Stress: Not on file  Relationships  . Social connections:    Talks on phone: Not on file    Gets together: Not on file    Attends religious service: Not on file    Active member of club or organization: Not on file    Attends meetings of clubs or organizations: Not on file    Relationship status: Not on file  . Intimate partner violence:    Fear of current or ex partner: Not on file    Emotionally abused: Not on file    Physically abused:  Not on file    Forced sexual activity: Not on file  Other Topics Concern  . Not on file  Social History Narrative   Exercise: sometimes   Caffeine: 2 cups 1/2 and 1/2 coffee daily   6 grandchildren (none of her children are in [place])   Married          Past Surgical History:  Procedure Laterality Date  . TUBAL LIGATION    . TUBAL LIGATION      Family History  Problem Relation Age of Onset  . Ulcerative colitis Brother   . Cancer Mother 72       breast  . Arthritis Other   . Diabetes Other   . Cancer Other        bladder  . Cancer Cousin 65       Stage 3 breast cancer  . COPD Father        lived to be 48  . Bladder Cancer Father     No Known Allergies  Current Outpatient Medications on File Prior to Visit  Medication Sig Dispense Refill  . aspirin 81 MG EC tablet Take 81 mg by mouth daily.      Marland Kitchen  Calcium Carbonate-Vitamin D 600-400 MG-UNIT tablet Take 1 tablet by mouth 2 (two) times daily.    . Misc Natural Products (OSTEO BI-FLEX ADV TRIPLE ST) TABS Take 1 tablet by mouth daily.      . Multiple Vitamin (MULTIVITAMIN) tablet Take 1 tablet by mouth daily.      . Omega-3 Fatty Acids (FISH OIL) 1000 MG CAPS Take 2 capsules (2,000 mg total) by mouth 2 (two) times daily. (Patient taking differently: Take 2,000 mg by mouth daily. )  0  . pravastatin (PRAVACHOL) 20 MG tablet TAKE ONE TABLET BY MOUTH DAILY 90 tablet 1  . Wheat Dextrin (BENEFIBER PO) Take 2 teaspoons once a day.     No current facility-administered medications on file prior to visit.     BP 130/73 (BP Location: Left Arm, Cuff Size: Normal)   Pulse 97   Temp 98.9 F (37.2 C) (Oral)   Resp 16   Ht 5\' 2"  (1.575 m)   Wt 136 lb 3.2 oz (61.8 kg)   LMP 07/25/1998   SpO2 99%   BMI 24.91 kg/m       Objective:   Physical Exam  Constitutional: She appears well-developed and well-nourished.  HENT:  Head: Normocephalic and atraumatic.  Left Ear: Tympanic membrane is erythematous and bulging.  Eyes: Right  conjunctiva is not injected. Left conjunctiva is injected.  Cardiovascular: Normal rate, regular rhythm and normal heart sounds.  No murmur heard. Pulmonary/Chest: Effort normal and breath sounds normal. No respiratory distress. She has no wheezes.  Psychiatric: She has a normal mood and affect. Her behavior is normal. Judgment and thought content normal.          Assessment & Plan:  Left otitis media- will rx with amoxicillin. Rapid strep is negative.   Left conjuctivitis- rx with cortisporin drops to both eyes.   Pt is advised to:  Call if new/worsening symptoms or if symptoms are not improved in 3 days.

## 2018-01-08 NOTE — Patient Instructions (Signed)
Please begin amoxicillin for your ear infection. Begin cortisporin eye drops for conjunctivitis. Call if new/worsening symptoms or if symptoms are not improved in 3 days.

## 2018-01-08 NOTE — Progress Notes (Deleted)
I d

## 2018-01-22 ENCOUNTER — Other Ambulatory Visit: Payer: Self-pay | Admitting: Family

## 2018-01-22 ENCOUNTER — Encounter: Payer: Self-pay | Admitting: Family

## 2018-01-22 MED ORDER — CIPROFLOXACIN HCL 0.3 % OP SOLN: OPHTHALMIC | 0 refills | Status: DC

## 2018-01-23 ENCOUNTER — Encounter: Payer: Self-pay | Admitting: Family

## 2018-01-23 ENCOUNTER — Ambulatory Visit (INDEPENDENT_AMBULATORY_CARE_PROVIDER_SITE_OTHER): Payer: Medicare HMO | Admitting: Internal Medicine

## 2018-01-23 ENCOUNTER — Encounter: Payer: Self-pay | Admitting: Internal Medicine

## 2018-01-23 VITALS — BP 128/74 | HR 87 | Temp 98.1°F | Resp 16 | Ht 62.0 in | Wt 137.2 lb

## 2018-01-23 DIAGNOSIS — H1030 Unspecified acute conjunctivitis, unspecified eye: Secondary | ICD-10-CM

## 2018-01-23 NOTE — Progress Notes (Signed)
Subjective:    Patient ID: Jocelyn Morgan, female    DOB: Aug 15, 1950, 67 y.o.   MRN: [id number]  DOS:  01/23/2018 Type of visit - description : acute Interval history:  Was seen at this office on 01/08/2018, she had cough, head congestion, going on for few days. Was diagnosed with left otitis media and prescribed amoxicillin Also left conjunctivitis and prescribed antibiotic eyedrops. Here because she is better however yesterday, her right eye got red. Denies any eye pain or blurred vision. She had some discharge from the R eye.   Review of Systems No fever chills Cough is resolved Denies sneezing, itchy eyes or itchy nose. Still feels some ear and sinus congestion.  Past Medical History:  Diagnosis Date  . Arthritis    osteoarthritis  . Hyperlipidemia   . Liver hemangioma 03/16/2011  . Postmenopausal HRT (hormone replacement therapy)     Past Surgical History:  Procedure Laterality Date  . TUBAL LIGATION    . TUBAL LIGATION      Social History   Socioeconomic History  . Marital status: Married    Spouse name: Not on file  . Number of children: 4  . Years of education: Not on file  . Highest education level: Not on file  Occupational History  . Occupation: housewife  Social Needs  . Financial resource strain: Not on file  . Food insecurity:    Worry: Not on file    Inability: Not on file  . Transportation needs:    Medical: Not on file    Non-medical: Not on file  Tobacco Use  . Smoking status: Never Smoker  . Smokeless tobacco: Never Used  Substance and Sexual Activity  . Alcohol use: Yes    Alcohol/week: 0.6 - 1.2 oz    Types: 1 - 2 Glasses of wine per week  . Drug use: No  . Sexual activity: Yes  Lifestyle  . Physical activity:    Days per week: Not on file    Minutes per session: Not on file  . Stress: Not on file  Relationships  . Social connections:    Talks on phone: Not on file    Gets together: Not on file    Attends religious  service: Not on file    Active member of club or organization: Not on file    Attends meetings of clubs or organizations: Not on file    Relationship status: Not on file  . Intimate partner violence:    Fear of current or ex partner: Not on file    Emotionally abused: Not on file    Physically abused: Not on file    Forced sexual activity: Not on file  Other Topics Concern  . Not on file  Social History Narrative   Exercise: sometimes   Caffeine: 2 cups 1/2 and 1/2 coffee daily   6 grandchildren (none of her children are in [place])   Married            Allergies as of 01/23/2018   No Known Allergies     Medication List        Accurate as of 01/23/18 11:59 PM. Always use your most recent med list.          aspirin 81 MG EC tablet Take 81 mg by mouth daily.   BENEFIBER PO Take 2 teaspoons once a day.   Calcium Carbonate-Vitamin D 600-400 MG-UNIT tablet Take 1 tablet by mouth 2 (two) times daily.  ciprofloxacin 0.3 % ophthalmic solution Commonly known as:  CILOXAN Administer 1 drop, every 2 hours, while awake, for 2 days. Then 1 drop, every 4 hours, while awake, for the next 5 days.   Fish Oil 1000 MG Caps Take 2 capsules (2,000 mg total) by mouth 2 (two) times daily.   multivitamin tablet Take 1 tablet by mouth daily.   OSTEO BI-FLEX ADV TRIPLE ST Tabs Take 1 tablet by mouth daily.   pravastatin 20 MG tablet Commonly known as:  PRAVACHOL TAKE ONE TABLET BY MOUTH DAILY          Objective:   Physical Exam BP 128/74 (BP Location: Left Arm, Patient Position: Sitting, Cuff Size: Small)   Pulse 87   Temp 98.1 F (36.7 C) (Oral)   Resp 16   Ht 5\' 2"  (1.575 m)   Wt 137 lb 4 oz (62.3 kg)   LMP 07/25/1998   SpO2 98%   BMI 25.10 kg/m   General:   Well developed, NAD, see BMI.  HEENT:  Normocephalic . Face symmetric, atraumatic TMs slightly bulged but not red.  No discharge Throat symmetric, normal rate Nose with minimal congestion EOMI, pupils equal and  reactive Left eye normal Right eye: + Conjunctiva redness, anterior chamber normal. Lungs:  CTA B Normal respiratory effort, no intercostal retractions, no accessory muscle use. Heart: RRR,  no murmur.  No pretibial edema bilaterally  Skin: Not pale. Not jaundice Neurologic:  alert & oriented X3.  Speech normal, gait appropriate for age and unassisted Psych--  Cognition and judgment appear intact.  Cooperative with normal attention span and concentration.  Behavior appropriate. No anxious or depressed appearing.      Assessment & Plan:    67 year old female, medical history includes high cholesterol, DJD, presents with  Conjunctivitis: Right-sided conjunctivitis, unclear if related to the recent infection she had on the left eye.  PCP already call Ciloxan, I recommend to continue with that and also artificial tears and a cold compress.  Call if not better Left otitis: Status post amoxicillin, better, still has some bulging on the TM but no redness.  Recommend Flonase.  See instructions

## 2018-01-23 NOTE — Progress Notes (Signed)
Pre visit review using our clinic review tool, if applicable. No additional management support is needed unless otherwise documented below in the visit note. 

## 2018-01-23 NOTE — Patient Instructions (Signed)
Continue with the antibiotic eyedrops  Use artificial tears to get some comfort  Use a cold compress to the eye if needed  Flonase: 2 sprays on each side of the nose until congestion in the ears and sinus is gone  Call if not gradually better  Call if you have severe eye pain, blurry vision.

## 2018-03-01 DIAGNOSIS — H52223 Regular astigmatism, bilateral: Secondary | ICD-10-CM | POA: Diagnosis not present

## 2018-03-01 DIAGNOSIS — H5203 Hypermetropia, bilateral: Secondary | ICD-10-CM | POA: Diagnosis not present

## 2018-03-01 DIAGNOSIS — H524 Presbyopia: Secondary | ICD-10-CM | POA: Diagnosis not present

## 2018-04-25 DIAGNOSIS — R69 Illness, unspecified: Secondary | ICD-10-CM | POA: Diagnosis not present

## 2018-05-16 ENCOUNTER — Ambulatory Visit (INDEPENDENT_AMBULATORY_CARE_PROVIDER_SITE_OTHER): Payer: Medicare HMO | Admitting: Family

## 2018-05-16 ENCOUNTER — Encounter: Payer: Self-pay | Admitting: Family

## 2018-05-16 VITALS — BP 140/80 | HR 73 | Temp 98.3°F | Resp 16 | Ht 62.0 in | Wt 140.2 lb

## 2018-05-16 DIAGNOSIS — I10 Essential (primary) hypertension: Secondary | ICD-10-CM

## 2018-05-16 DIAGNOSIS — Z23 Encounter for immunization: Secondary | ICD-10-CM

## 2018-05-16 DIAGNOSIS — E785 Hyperlipidemia, unspecified: Secondary | ICD-10-CM

## 2018-05-16 DIAGNOSIS — H9190 Unspecified hearing loss, unspecified ear: Secondary | ICD-10-CM

## 2018-05-16 DIAGNOSIS — Z0000 Encounter for general adult medical examination without abnormal findings: Secondary | ICD-10-CM

## 2018-05-16 DIAGNOSIS — Z1239 Encounter for other screening for malignant neoplasm of breast: Secondary | ICD-10-CM

## 2018-05-16 LAB — COMPREHENSIVE METABOLIC PANEL
ALBUMIN: 4.7 g/dL (ref 3.5–5.2)
ALT: 11 U/L (ref 0–35)
AST: 14 U/L (ref 0–37)
Alkaline Phosphatase: 50 U/L (ref 39–117)
BILIRUBIN TOTAL: 0.5 mg/dL (ref 0.2–1.2)
BUN: 16 mg/dL (ref 6–23)
CO2: 29 mEq/L (ref 19–32)
CREATININE: 0.65 mg/dL (ref 0.40–1.20)
Calcium: 9.8 mg/dL (ref 8.4–10.5)
Chloride: 104 mEq/L (ref 96–112)
GFR: 96.24 mL/min (ref >60.00)
GLUCOSE: 103 mg/dL — AB (ref 70–99)
Potassium: 4.6 mEq/L (ref 3.5–5.1)
SODIUM: 140 meq/L (ref 135–145)
TOTAL PROTEIN: 7 g/dL (ref 6.0–8.3)

## 2018-05-16 LAB — LIPID PANEL
Cholesterol: 182 mg/dL (ref 0–200)
HDL: 41.5 mg/dL (ref >39.00)
NonHDL: 140.01
Total CHOL/HDL Ratio: 4
Triglycerides: 212 mg/dL — ABNORMAL HIGH (ref 0.0–149.0)
VLDL: 42.4 mg/dL — ABNORMAL HIGH (ref 0.0–40.0)

## 2018-05-16 LAB — LDL CHOLESTEROL, DIRECT: LDL DIRECT: 106 mg/dL

## 2018-05-16 MED ORDER — SCOPOLAMINE 1 MG/3DAYS TD PT72: 1.0000 | MEDICATED_PATCH | TRANSDERMAL | 0 refills | Status: DC

## 2018-05-16 NOTE — Progress Notes (Signed)
Subjective:    Patient ID: Jocelyn Morgan, female    DOB: Dec 20, 1950, 67 y.o.   MRN: [id number]  HPI  Patient presents today for complete physical.  Immunizations: flu shot today, shingrix, tetanus, prevnar/pneumovax up to date Diet: trying to eat healthy Wt Readings from Last 3 Encounters:  05/16/18 140 lb 3.2 oz (63.6 kg)  01/23/18 137 lb 4 oz (62.3 kg)  01/08/18 136 lb 3.2 oz (61.8 kg)  Exercise: regular walking Colonoscopy: 9/17 Dexa: 11/17 Pap Smear: 9/15, N/A due to age 69: 11/18 Vision: up to date Dental: up to date     Review of Systems  Constitutional: Negative for unexpected weight change.  HENT: Negative for rhinorrhea.        Reports some hearing issues  Eyes: Negative for visual disturbance.  Respiratory: Negative for cough.   Cardiovascular: Negative for leg swelling.  Gastrointestinal: Negative for diarrhea, nausea and vomiting.  Genitourinary: Negative for dysuria, frequency and hematuria.  Musculoskeletal:       Occasional hip pain  Skin: Negative for rash.  Neurological: Negative for headaches.  Hematological: Negative for adenopathy.  Psychiatric/Behavioral:       Denies depression, notes some mild anxiety.  Notes that this occurs when she goes to visit her children/grandchildren.  Has vomiting when she goes to visit her children.   Occurs when her children visit her as well.     Past Medical History:  Diagnosis Date  . Arthritis    osteoarthritis  . Hyperlipidemia   . Liver hemangioma 03/16/2011  . Postmenopausal HRT (hormone replacement therapy)      Social History   Socioeconomic History  . Marital status: Married    Spouse name: Not on file  . Number of children: 4  . Years of education: Not on file  . Highest education level: Not on file  Occupational History  . Occupation: housewife  Social Needs  . Financial resource strain: Not on file  . Food insecurity:    Worry: Not on file    Inability: Not on file  .  Transportation needs:    Medical: Not on file    Non-medical: Not on file  Tobacco Use  . Smoking status: Never Smoker  . Smokeless tobacco: Never Used  Substance and Sexual Activity  . Alcohol use: Yes    Alcohol/week: 1.0 - 2.0 standard drinks    Types: 1 - 2 Glasses of wine per week  . Drug use: No  . Sexual activity: Yes  Lifestyle  . Physical activity:    Days per week: Not on file    Minutes per session: Not on file  . Stress: Not on file  Relationships  . Social connections:    Talks on phone: Not on file    Gets together: Not on file    Attends religious service: Not on file    Active member of club or organization: Not on file    Attends meetings of clubs or organizations: Not on file    Relationship status: Not on file  . Intimate partner violence:    Fear of current or ex partner: Not on file    Emotionally abused: Not on file    Physically abused: Not on file    Forced sexual activity: Not on file  Other Topics Concern  . Not on file  Social History Narrative   Exercise: sometimes   Caffeine: 2 cups 1/2 and 1/2 coffee daily   6 grandchildren (none of her children are  in [place])   Married          Past Surgical History:  Procedure Laterality Date  . TUBAL LIGATION    . TUBAL LIGATION      Family History  Problem Relation Age of Onset  . Ulcerative colitis Brother   . Cancer Mother 72       breast  . Arthritis Other   . Diabetes Other   . Cancer Other        bladder  . Cancer Cousin 65       Stage 3 breast cancer  . COPD Father        lived to be 38  . Bladder Cancer Father     No Known Allergies  Current Outpatient Medications on File Prior to Visit  Medication Sig Dispense Refill  . aspirin 81 MG EC tablet Take 81 mg by mouth daily.      . Calcium Carbonate-Vitamin D 600-400 MG-UNIT tablet Take 1 tablet by mouth 2 (two) times daily.    . Misc Natural Products (OSTEO BI-FLEX ADV TRIPLE ST) TABS Take 1 tablet by mouth daily.      . Multiple  Vitamin (MULTIVITAMIN) tablet Take 1 tablet by mouth daily.      . pravastatin (PRAVACHOL) 20 MG tablet TAKE ONE TABLET BY MOUTH DAILY 90 tablet 1   No current facility-administered medications on file prior to visit.     BP 140/80 (BP Location: Right Arm, Cuff Size: Normal)   Pulse 73   Temp 98.3 F (36.8 C) (Oral)   Resp 16   Ht 5\' 2"  (1.575 m)   Wt 140 lb 3.2 oz (63.6 kg)   LMP 07/25/1998   SpO2 98%   BMI 25.64 kg/m       Objective:   Physical Exam  Physical Exam  Constitutional: She is oriented to person, place, and time. She appears well-developed and well-nourished. No distress.  HENT:  Head: Normocephalic and atraumatic.  Right Ear: Tympanic membrane and ear canal normal.  Left Ear: Tympanic membrane and ear canal normal.  Mouth/Throat: Oropharynx is clear and moist.  Eyes: Pupils are equal, round, and reactive to light. No scleral icterus.  Neck: Normal range of motion. No thyromegaly present.  Cardiovascular: Normal rate and regular rhythm.   No murmur heard. Pulmonary/Chest: Effort normal and breath sounds normal. No respiratory distress. He has no wheezes. She has no rales. She exhibits no tenderness.  Abdominal: Soft. Bowel sounds are normal. She exhibits no distension and no mass. There is no tenderness. There is no rebound and no guarding.  Musculoskeletal: She exhibits no edema.  Lymphadenopathy:    She has no cervical adenopathy.  Neurological: She is alert and oriented to person, place, and time. She has normal patellar reflexes. She exhibits normal muscle tone. Coordination normal.  Skin: Skin is warm and dry.  Psychiatric: She has a normal mood and affect. Her behavior is normal. Judgment and thought content normal.  Breasts: Examined lying Right: Without masses, retractions, discharge or axillary adenopathy.  Left: Without masses, retractions, discharge or axillary adenopathy.  Pelvic: deferred        Assessment & Plan:   Preventative care-  discussed healthy diet, exercise. Obtain follow up cmet/lipid panel. Immunizations reviewed and up to date. Colo, dexa, up to date. We discussed some counseling for her anxiety.  I gave her a handout with contact information to schedule an appointment. Refer for hearing test.  Mammo due in November, order placed.  Assessment & Plan:

## 2018-05-16 NOTE — Patient Instructions (Addendum)
Please complete lab work prior to leaving. Continue healthy diet, exercise.  

## 2018-05-17 ENCOUNTER — Telehealth: Payer: Self-pay

## 2018-05-17 DIAGNOSIS — E7800 Pure hypercholesterolemia, unspecified: Secondary | ICD-10-CM

## 2018-05-17 MED ORDER — PRAVASTATIN SODIUM 20 MG PO TABS: 20.0000 mg | ORAL_TABLET | Freq: Every day | ORAL | 1 refills | Status: DC

## 2018-05-17 NOTE — Telephone Encounter (Signed)
Received refill request via fax for pravastatin; refilled per protocol.

## 2018-05-18 ENCOUNTER — Encounter: Payer: Self-pay | Admitting: Family

## 2018-05-18 ENCOUNTER — Telehealth: Payer: Self-pay | Admitting: *Deleted

## 2018-05-18 NOTE — Telephone Encounter (Signed)
Melissa -- Received fax from [employer] that Transderm-Scop [facility] is requiring PA. Call [phone] or [phone]. ([employer] ID [place])   Is pt going on a cruise, was there another reason for the RX? Any contraindications to other alternatives?

## 2018-05-18 NOTE — Telephone Encounter (Signed)
No other indications, just for her cruise.

## 2018-05-18 NOTE — Telephone Encounter (Signed)
Spoke with Nellie at below #. Answered questions due to higher risk medication in pt's over 68yrs old. Approval received until 07/24/18. Referral # [id number]. Fax will be sent to our office and copy sent to pt. Notified Autumn at [employer]. She states Rx went through but is showing a $44 copay. Advised her if pt is unable to pay that amount she may advise pt to purchase over the counter dramamine that is non drowsy or the equivalent and she voices understanding.

## 2018-06-13 ENCOUNTER — Ambulatory Visit (INDEPENDENT_AMBULATORY_CARE_PROVIDER_SITE_OTHER): Payer: Medicare HMO

## 2018-06-13 DIAGNOSIS — Z1239 Encounter for other screening for malignant neoplasm of breast: Secondary | ICD-10-CM

## 2018-06-13 DIAGNOSIS — Z1231 Encounter for screening mammogram for malignant neoplasm of breast: Secondary | ICD-10-CM | POA: Diagnosis not present

## 2018-06-18 ENCOUNTER — Encounter: Payer: Self-pay | Admitting: Family

## 2018-06-18 DIAGNOSIS — H903 Sensorineural hearing loss, bilateral: Secondary | ICD-10-CM | POA: Diagnosis not present

## 2018-06-29 ENCOUNTER — Telehealth: Payer: Self-pay | Admitting: *Deleted

## 2018-06-29 NOTE — Telephone Encounter (Signed)
Received hearing tests results from [facility]; forwarded to provider/SLS 12/06

## 2018-08-29 ENCOUNTER — Encounter: Payer: Self-pay | Admitting: Family

## 2018-08-29 ENCOUNTER — Ambulatory Visit (INDEPENDENT_AMBULATORY_CARE_PROVIDER_SITE_OTHER): Payer: Medicare HMO | Admitting: Family

## 2018-08-29 VITALS — BP 136/72 | HR 94 | Temp 98.6°F | Resp 16 | Ht 62.0 in | Wt 141.0 lb

## 2018-08-29 DIAGNOSIS — L509 Urticaria, unspecified: Secondary | ICD-10-CM | POA: Diagnosis not present

## 2018-08-29 MED ORDER — PREDNISONE 20 MG PO TABS: ORAL_TABLET | ORAL | 0 refills | Status: DC

## 2018-08-29 NOTE — Progress Notes (Signed)
Subjective:    Patient ID: Jocelyn Morgan, female    DOB: [birth year], 68 y.o.   MRN: [id number]  HPI  Patient is a 68 yr old female who presents today with chief complaint of urticaria. Reports that symptoms began on 2/3 PM. Denies SOB/Wheezing tongue/lip swelling. Tried benadryl once which helped her to sleep.    Review of Systems See HPI  Past Medical History:  Diagnosis Date  . Arthritis    osteoarthritis  . Hyperlipidemia   . Liver hemangioma 03/16/2011  . Postmenopausal HRT (hormone replacement therapy)      Social History   Socioeconomic History  . Marital status: Married    Spouse name: Not on file  . Number of children: 4  . Years of education: Not on file  . Highest education level: Not on file  Occupational History  . Occupation: housewife  Social Needs  . Financial resource strain: Not on file  . Food insecurity:    Worry: Not on file    Inability: Not on file  . Transportation needs:    Medical: Not on file    Non-medical: Not on file  Tobacco Use  . Smoking status: Never Smoker  . Smokeless tobacco: Never Used  Substance and Sexual Activity  . Alcohol use: Yes    Alcohol/week: 1.0 - 2.0 standard drinks    Types: 1 - 2 Glasses of wine per week  . Drug use: No  . Sexual activity: Yes  Lifestyle  . Physical activity:    Days per week: Not on file    Minutes per session: Not on file  . Stress: Not on file  Relationships  . Social connections:    Talks on phone: Not on file    Gets together: Not on file    Attends religious service: Not on file    Active member of club or organization: Not on file    Attends meetings of clubs or organizations: Not on file    Relationship status: Not on file  . Intimate partner violence:    Fear of current or ex partner: Not on file    Emotionally abused: Not on file    Physically abused: Not on file    Forced sexual activity: Not on file  Other Topics Concern  . Not on file  Social History Narrative   Exercise: sometimes   Caffeine: 2 cups 1/2 and 1/2 coffee daily   6 grandchildren (none of her children are in [place])   Married          Past Surgical History:  Procedure Laterality Date  . TUBAL LIGATION    . TUBAL LIGATION      Family History  Problem Relation Age of Onset  . Ulcerative colitis Brother   . Cancer Mother 19       breast  . Arthritis Other   . Diabetes Other   . Cancer Other        bladder  . Cancer Cousin 65       Stage 3 breast cancer  . COPD Father        lived to be 97  . Bladder Cancer Father     No Known Allergies  Current Outpatient Medications on File Prior to Visit  Medication Sig Dispense Refill  . aspirin 81 MG EC tablet Take 81 mg by mouth daily.      . Calcium Carbonate-Vitamin D 600-400 MG-UNIT tablet Take 1 tablet by mouth 2 (two) times daily.    Marland Kitchen  Multiple Vitamin (MULTIVITAMIN) tablet Take 1 tablet by mouth daily.      . pravastatin (PRAVACHOL) 20 MG tablet Take 1 tablet (20 mg total) by mouth daily. 90 tablet 1  . scopolamine (TRANSDERM-SCOP) 1 MG/3DAYS Place 1 patch (1.5 mg total) onto the skin every 3 (three) days. 2 patch 0  . Misc Natural Products (OSTEO BI-FLEX ADV TRIPLE ST) TABS Take 1 tablet by mouth daily.       No current facility-administered medications on file prior to visit.     BP 136/72 (BP Location: Left Arm, Patient Position: Sitting, Cuff Size: Small)   Pulse 94   Temp 98.6 F (37 C) (Oral)   Resp 16   Ht 5\' 2"  (1.575 m)   Wt 141 lb (64 kg)   LMP 07/25/1998   SpO2 97%   BMI 25.79 kg/m       Objective:   Physical Exam Constitutional:      Appearance: She is well-developed.  Neck:     Musculoskeletal: Neck supple.     Thyroid: No thyromegaly.  Cardiovascular:     Rate and Rhythm: Normal rate and regular rhythm.     Heart sounds: Normal heart sounds. No murmur.  Pulmonary:     Effort: Pulmonary effort is normal. No respiratory distress.     Breath sounds: Normal breath sounds. No wheezing.  Skin:     General: Skin is warm and dry.     Comments: Some urticaria noted on bilateral posterior thighs and chest/abdomen  Neurological:     Mental Status: She is alert and oriented to person, place, and time.  Psychiatric:        Behavior: Behavior normal.        Thought Content: Thought content normal.        Judgment: Judgment normal.           Assessment & Plan:  Urticaria- she is concerned that this represents bed bugs and I have advised her that this does not appear consistent with bedbugs.  Recommended the following:  Please begin prednisone once daily for 5 days. You may use benadryl as needed at bedtime. Call if symptoms worsen or if not improved in 2-3 days.

## 2018-08-29 NOTE — Patient Instructions (Addendum)
Please begin prednisone once daily for 5 days. You may use benadryl as needed at bedtime. Call if symptoms worsen or if not improved in 2-3 days.

## 2018-09-04 ENCOUNTER — Encounter: Payer: Self-pay | Admitting: Family

## 2018-09-04 DIAGNOSIS — L509 Urticaria, unspecified: Secondary | ICD-10-CM

## 2018-09-05 NOTE — Addendum Note (Signed)
Addended by: Debbrah Alar on: 09/05/2018 09:30 AM   Modules accepted: Orders

## 2018-09-10 ENCOUNTER — Telehealth: Payer: Self-pay | Admitting: Family

## 2018-09-10 DIAGNOSIS — L509 Urticaria, unspecified: Secondary | ICD-10-CM

## 2018-09-10 NOTE — Telephone Encounter (Signed)
See mychart,

## 2018-09-16 ENCOUNTER — Other Ambulatory Visit: Payer: Self-pay

## 2018-09-16 ENCOUNTER — Emergency Department (HOSPITAL_BASED_OUTPATIENT_CLINIC_OR_DEPARTMENT_OTHER)
Admission: EM | Admit: 2018-09-16 | Discharge: 2018-09-16 | Disposition: A | Discharge status: Home / Self Care | Payer: Medicare HMO | Attending: Emergency Medicine | Admitting: Emergency Medicine

## 2018-09-16 ENCOUNTER — Encounter (HOSPITAL_BASED_OUTPATIENT_CLINIC_OR_DEPARTMENT_OTHER): Payer: Self-pay | Admitting: Emergency Medicine

## 2018-09-16 DIAGNOSIS — L509 Urticaria, unspecified: Secondary | ICD-10-CM

## 2018-09-16 DIAGNOSIS — Z7982 Long term (current) use of aspirin: Secondary | ICD-10-CM | POA: Diagnosis not present

## 2018-09-16 DIAGNOSIS — Z79899 Other long term (current) drug therapy: Secondary | ICD-10-CM | POA: Insufficient documentation

## 2018-09-16 MED ORDER — EPINEPHRINE 0.3 MG/0.3ML IJ SOAJ: 0.3000 mg | INTRAMUSCULAR | 1 refills | Status: DC | PRN

## 2018-09-16 NOTE — Discharge Instructions (Signed)
Return to the ER or call 911 if you develop recurrent lump in your throat, trouble swallowing or breathing, lip or tongue swelling, vomiting, or any other new/concerning symptoms.  If you feel the symptoms, give yourself the epinephrine pen and call 911.

## 2018-09-16 NOTE — ED Provider Notes (Signed)
[facility] Provider Note   CSN: [id number] Arrival date & time: 09/16/18  2044    History   Chief Complaint Chief Complaint  Patient presents with  . Allergic Reaction    HPI Jocelyn Morgan is a 68 y.o. female.     HPI  68 year old female presents with hives.  She is been having hives continuously for about 3 weeks though they have waxed and waned with treatment such as prednisone when it first started and Benadryl.  She is due to follow-up with an allergist in a couple days and so she last took Benadryl 2 days ago as instructed by them.  She is due to be off it for a few days before seeing them for testing.  Tonight, however, the hives seem to be a little worse and then she felt like there was a lump in her throat.  No trouble breathing or swallowing.  No lip or tongue swelling.  She took 2 Benadryl tablets and came here.  She feels improvement.  Past Medical History:  Diagnosis Date  . Arthritis    osteoarthritis  . Hyperlipidemia   . Liver hemangioma 03/16/2011  . Postmenopausal HRT (hormone replacement therapy)     Patient Active Problem List   Diagnosis Date Noted  . Hip pain, bilateral 08/19/2014  . Benign paroxysmal positional vertigo 04/23/2014  . Atrophic vaginitis 04/23/2014  . Neck pain 10/18/2013  . Diverticulitis 10/18/2013  . Abdominal pain, left lower quadrant 10/11/2013  . Mass of mouth 10/15/2012  . Atypical chest pain 12/26/2011  . Liver hemangioma 03/16/2011  . Osteopenia 11/17/2010  . General medical examination 10/25/2010  . 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 Low back pain, episodic 10/25/2010  . VAGINITIS, ATROPHIC, POSTMENOPAUSAL 05/05/2009  . OSTEOARTHRITIS 06/30/2008  . HYPERCHOLESTEROLEMIA 04/03/2008  . DECREASED HEARING, BILATERAL 04/03/2008    Past Surgical History:  Procedure Laterality Date  . TUBAL LIGATION    . TUBAL LIGATION       OB History   No obstetric history on file.      Home Medications    Prior to Admission medications   Medication Sig Start Date End Date Taking? Authorizing Provider  aspirin 81 MG EC tablet Take 81 mg by mouth daily.      [provider]  Calcium Carbonate-Vitamin D 600-400 MG-UNIT tablet Take 1 tablet by mouth 2 (two) times daily. 06/12/16   Debbrah Alar, NP  EPINEPHrine 0.3 mg/0.3 mL IJ SOAJ injection Inject 0.3 mLs (0.3 mg total) into the muscle as needed for anaphylaxis. 09/16/18   Sherwood Gambler, MD  Misc Natural Products (OSTEO BI-FLEX ADV TRIPLE ST) TABS Take 1 tablet by mouth daily.      [provider]  Multiple Vitamin (MULTIVITAMIN) tablet Take 1 tablet by mouth daily.      [provider]  pravastatin (PRAVACHOL) 20 MG tablet Take 1 tablet (20 mg total) by mouth daily. 05/17/18   Debbrah Alar, NP  predniSONE (DELTASONE) 20 MG tablet 2 tabs by mouth once daily for 5 days 08/29/18   Debbrah Alar, NP  scopolamine (TRANSDERM-SCOP) 1 MG/3DAYS Place 1 patch (1.5 mg total) onto the skin every 3 (three) days. 05/16/18   Debbrah Alar, NP    Family History Family History  Problem Relation Age of Onset  . Ulcerative colitis Brother   . Cancer Mother 52       breast  . Arthritis Other   . Diabetes Other   .  Cancer Other        bladder  . Cancer Cousin 65       Stage 3 breast cancer  . COPD Father        lived to be 2  . Bladder Cancer Father     Social History Social History   Tobacco Use  . Smoking status: Never Smoker  . Smokeless tobacco: Never Used  Substance Use Topics  . Alcohol use: Yes    Alcohol/week: 1.0 - 2.0 standard drinks    Types: 1 - 2 Glasses of wine per week  . Drug use: No     Allergies   Patient has no known allergies.   Review of Systems Review of Systems  Constitutional: Negative for fever.  HENT: Negative for trouble swallowing and voice change.   Respiratory: Negative for cough and shortness of breath.   Gastrointestinal:  Negative for vomiting.  Skin: Positive for rash.  All other systems reviewed and are negative.    Physical Exam Updated Vital Signs BP (!) 148/84 (BP Location: Left Arm)   Pulse (!) 113   Temp 98.3 F (36.8 C) (Oral)   Resp 20   Ht 5\' 2"  (1.575 m)   Wt 64 kg   LMP 07/25/1998   SpO2 99%   BMI 25.81 kg/m   Physical Exam Vitals signs and nursing note reviewed.  Constitutional:      Appearance: She is well-developed.  HENT:     Head: Normocephalic and atraumatic.     Comments: No lip, tongue, or oropharyngeal swelling.  Normal voice and speech.    Right Ear: External ear normal.     Left Ear: External ear normal.     Nose: Nose normal.  Eyes:     General:        Right eye: No discharge.        Left eye: No discharge.  Cardiovascular:     Rate and Rhythm: Regular rhythm. Tachycardia present.     Heart sounds: Normal heart sounds.  Pulmonary:     Effort: Pulmonary effort is normal.     Breath sounds: Normal breath sounds. No stridor. No wheezing.  Abdominal:     Palpations: Abdomen is soft.     Tenderness: There is no abdominal tenderness.  Skin:    General: Skin is warm and dry.     Findings: Rash present. Rash is urticarial.     Comments: Hives throughout anterior and posterior chest and abdomen.  Neurological:     Mental Status: She is alert.  Psychiatric:        Mood and Affect: Mood is not anxious.      ED Treatments / Results  Labs (all labs ordered are listed, but only abnormal results are displayed) Labs Reviewed - No data to display  EKG None  Radiology No results found.  Procedures Procedures (including critical care time)  Medications Ordered in ED Medications - No data to display   Initial Impression / Assessment and Plan / ED Course  I have reviewed the triage vital signs and the nursing notes.  Pertinent labs & imaging results that were available during my care of the patient were reviewed by me and considered in my medical decision  making (see chart for details).        Patient is well-appearing here.  I discussed with patient and she would like to hold off on steroids as she is still due to see the allergist.  She is not sure  if she has messed up the testing due to the dose of Benadryl tonight.  I will give her an EpiPen prescription and discussed indications to use this.  Otherwise, she appears much better after the single dose of Benadryl and so I do not think steroids are mandatory and would not want to mess up her testing and she agrees.  We did discuss strict return precautions and she will call the allergist tomorrow.  She is noted to be tachycardic, though I think this is more side effect of the Benadryl rather than underlying pathology as she otherwise has no acute complaints and appears well.  Final Clinical Impressions(s) / ED Diagnoses   Final diagnoses:  Hives    ED Discharge Orders         Ordered    EPINEPHrine 0.3 mg/0.3 mL IJ SOAJ injection  As needed     09/16/18 2200           Sherwood Gambler, MD 09/16/18 2222

## 2018-09-16 NOTE — ED Notes (Signed)
Pt states generalized hives x 3 weeks, improved with antihistimines but allergist asked her to stop meds prior to her upcoming appointment. Pt states hives worsened as well as "feeling of lump in throat" that is new. Pt is unaware of what she could be reacting to, no hx of allergies. Pt is speaking in clear complete sentences without difficulty and managing her own secretions. No swelling noted to throat.

## 2018-09-16 NOTE — ED Triage Notes (Addendum)
Patient states that she has had hives for the last 3 weeks  - patient states that she had a funny feeling to her throat today after not being on her meds x 3 days. Patient took 2 benadryl about 45 minutes ago

## 2018-09-18 ENCOUNTER — Ambulatory Visit: Payer: Medicare HMO | Admitting: Allergy and Immunology

## 2018-09-18 ENCOUNTER — Encounter: Payer: Self-pay | Admitting: Allergy and Immunology

## 2018-09-18 VITALS — BP 132/70 | HR 98 | Temp 97.9°F | Resp 14 | Ht 62.0 in | Wt 143.0 lb

## 2018-09-18 DIAGNOSIS — J310 Chronic rhinitis: Secondary | ICD-10-CM

## 2018-09-18 DIAGNOSIS — T783XXD Angioneurotic edema, subsequent encounter: Secondary | ICD-10-CM | POA: Diagnosis not present

## 2018-09-18 DIAGNOSIS — L500 Allergic urticaria: Secondary | ICD-10-CM | POA: Diagnosis not present

## 2018-09-18 DIAGNOSIS — T7840XA Allergy, unspecified, initial encounter: Secondary | ICD-10-CM | POA: Insufficient documentation

## 2018-09-18 DIAGNOSIS — T7840XD Allergy, unspecified, subsequent encounter: Secondary | ICD-10-CM | POA: Diagnosis not present

## 2018-09-18 DIAGNOSIS — K2970 Gastritis, unspecified, without bleeding: Secondary | ICD-10-CM | POA: Diagnosis not present

## 2018-09-18 DIAGNOSIS — T783XXA Angioneurotic edema, initial encounter: Secondary | ICD-10-CM | POA: Insufficient documentation

## 2018-09-18 MED ORDER — MONTELUKAST SODIUM 10 MG PO TABS: 10.0000 mg | ORAL_TABLET | Freq: Every day | ORAL | 5 refills | Status: DC

## 2018-09-18 MED ORDER — LEVOCETIRIZINE DIHYDROCHLORIDE 5 MG PO TABS: 5.0000 mg | ORAL_TABLET | Freq: Two times a day (BID) | ORAL | 5 refills | Status: DC

## 2018-09-18 MED ORDER — FAMOTIDINE 20 MG PO TABS: 20.0000 mg | ORAL_TABLET | Freq: Two times a day (BID) | ORAL | 5 refills | Status: DC

## 2018-09-18 MED ORDER — FLUTICASONE PROPIONATE 50 MCG/ACT NA SUSP: 2.0000 | Freq: Every day | NASAL | 3 refills | Status: DC | PRN

## 2018-09-18 NOTE — Patient Instructions (Addendum)
Allergic urticaria Unclear etiology. Skin tests to select food allergens were negative today. NSAIDs and emotional stress commonly exacerbate urticaria but are not the underlying etiology in this case. Physical urticarias are negative by history (i.e. pressure-induced, temperature, vibration, solar, etc.). History and lesions are not consistent with urticaria pigmentosa so I am not suspicious for mastocytosis. There are no concomitant symptoms concerning for anaphylaxis or constitutional symptoms worrisome for an underlying malignancy. We will rule out other potential etiologies with labs. For symptom relief, patient is to take oral antihistamines as directed.  The following labs have been ordered: Anti-thyroglobulin antibody, thyroid peroxidase antibody, tryptase, urea breath test, CBC, CMP, ESR, ANA, and galactose-alpha-1,3-galactose IgE level.  The patient will be called with further recommendations after lab results have returned.  Instructions have been discussed and provided for H1/H2 receptor blockade with titration to find lowest effective dose.  A prescription has been provided for levocetirizine (Xyzal), 5 mg daily as needed.  A prescription has been provided for famotidine (Pepcid), 20 mg twice daily as needed.  A prescription has been provided for montelukast 10 mg daily at bedtime.  Should there be a significant increase or change in symptoms, a journal is to be kept recording any foods eaten, beverages consumed, medications taken within a 6 hour period prior to the onset of symptoms, as well as record activities being performed, and environmental conditions. For any symptoms concerning for anaphylaxis, epinephrine is to be administered and 911 is to be called immediately.  Rhinitis All perennial and seasonal aeroallergens skin tests were negative despite a positive histamine control.  A prescription has been provided for fluticasone nasal spray, one spray per nostril 1-2 times daily  as needed. Proper nasal spray technique has been discussed and demonstrated.  Nasal saline spray (i.e., Simply Saline) or nasal saline lavage (i.e., NeilMed) is recommended as needed and prior to medicated nasal sprays.  Angioedema Associated angioedema occurs in up to 50% of patients with chronic urticaria.  Treatment/diagnostic plan as outlined above.   When lab results have returned the patient will be called with further recommendations and follow up instructions.  Urticaria (Hives)  . Levocetirizine (Xyzal) 5 mg twice a day and famotidine (Pepcid) 20 mg twice a day. If no symptoms for 7-14 days then decrease to. . Levocetirizine (Xyzal) 5 mg twice a day and famotidine (Pepcid) 20 mg once a day.  If no symptoms for 7-14 days then decrease to. . Levocetirizine (Xyzal) 5 mg twice a day.  If no symptoms for 7-14 days then decrease to. . Levocetirizine (Xyzal) 5 mg once a day.  May use Benadryl (diphenhydramine) as needed for breakthrough symptoms       If symptoms return, then step up dosage

## 2018-09-18 NOTE — Progress Notes (Signed)
New Patient Note  RE: [name] MRN: [id number] DOB: January 21, 1951 Date of Office Visit: 09/18/2018  Referring provider: Debbrah Alar, NP Primary care provider: Debbrah Alar, NP  Chief Complaint: Urticaria; Angioedema; and Allergic Reaction   History of present illness: Jocelyn Morgan is a 68 y.o. female seen today in consultation requested by Debbrah Alar, NP.  Over the past 3 weeks, [name] has experienced recurrent episodes of hives. The hives have mainly been on the torso and thighs but have appeared at different times over her entire body.  The lesions are described as erythematous, raised, and pruritic.  Individual hives last less than 24 hours without leaving residual pigmentation or bruising. She has experienced associated angioedema of the right eyelid and feet but denies concomitant cardiopulmonary or GI symptoms. She has not experienced unexpected weight loss, recurrent fevers or drenching night sweats. Had started an over the counter joint relief supplement but stopped the supplement without change in symptoms. Otherwise, no specific medication, food, skin care product, detergent, soap, or other environmental triggers have been identified. The symptoms do not seem to correlate with NSAIDs use or emotional stress. She did not have symptoms consistent with a respiratory tract infection at the time of symptom onset. [name] has tried to control symptoms with OTC antihistamines which have offered moderate temporary relief.  On Sunday evening, she developed hives on her face and perceived that she may be experiencing mild pruritus in her throat, therefore she was evaluated in the emergency department.  She was prescribed an epinephrine autoinjector.  Skin biopsy has not been performed. Recently, she has been experiencing hives daily. She experiences nasal congestion, rhinorrhea, red eyes, and irritated eyes.  These symptoms occur year-round but are more frequent  and severe with pollen exposure in the springtime and in the fall.  Assessment and plan: Allergic urticaria Unclear etiology. Skin tests to select food allergens were negative today. NSAIDs and emotional stress commonly exacerbate urticaria but are not the underlying etiology in this case. Physical urticarias are negative by history (i.e. pressure-induced, temperature, vibration, solar, etc.). History and lesions are not consistent with urticaria pigmentosa so I am not suspicious for mastocytosis. There are no concomitant symptoms concerning for anaphylaxis or constitutional symptoms worrisome for an underlying malignancy. We will rule out other potential etiologies with labs. For symptom relief, patient is to take oral antihistamines as directed.  The following labs have been ordered: Anti-thyroglobulin antibody, thyroid peroxidase antibody, tryptase, urea breath test, CBC, CMP, ESR, ANA, and galactose-alpha-1,3-galactose IgE level.  The patient will be called with further recommendations after lab results have returned.  Instructions have been discussed and provided for H1/H2 receptor blockade with titration to find lowest effective dose.  A prescription has been provided for levocetirizine (Xyzal), 5 mg daily as needed.  A prescription has been provided for famotidine (Pepcid), 20 mg twice daily as needed.  A prescription has been provided for montelukast 10 mg daily at bedtime.  Should there be a significant increase or change in symptoms, a journal is to be kept recording any foods eaten, beverages consumed, medications taken within a 6 hour period prior to the onset of symptoms, as well as record activities being performed, and environmental conditions. For any symptoms concerning for anaphylaxis, epinephrine is to be administered and 911 is to be called immediately.  Rhinitis All perennial and seasonal aeroallergens skin tests were negative despite a positive histamine control.  A  prescription has been provided for fluticasone nasal spray, one spray  per nostril 1-2 times daily as needed. Proper nasal spray technique has been discussed and demonstrated.  Nasal saline spray (i.e., Simply Saline) or nasal saline lavage (i.e., NeilMed) is recommended as needed and prior to medicated nasal sprays.  Angioedema Associated angioedema occurs in up to 50% of patients with chronic urticaria.  Treatment/diagnostic plan as outlined above.   Meds ordered this encounter  Medications  . levocetirizine (XYZAL) 5 MG tablet    Sig: Take 1 tablet (5 mg total) by mouth 2 (two) times daily.    Dispense:  60 tablet    Refill:  5  . famotidine (PEPCID) 20 MG tablet    Sig: Take 1 tablet (20 mg total) by mouth 2 (two) times daily.    Dispense:  60 tablet    Refill:  5  . montelukast (SINGULAIR) 10 MG tablet    Sig: Take 1 tablet (10 mg total) by mouth at bedtime.    Dispense:  30 tablet    Refill:  5  . fluticasone (FLONASE) 50 MCG/ACT nasal spray    Sig: Place 2 sprays into both nostrils daily as needed for allergies or rhinitis.    Dispense:  16 g    Refill:  3    Diagnostics: Environmental skin testing: Negative despite a positive histamine control. Food allergen skin testing: Negative despite a positive histamine control.    Physical examination: Blood pressure 132/70, pulse 98, temperature 97.9 F (36.6 C), temperature source Oral, resp. rate 14, height 5' 2"  (1.575 m), weight 143 lb (64.9 kg), last menstrual period 07/25/1998, SpO2 96 %.  General: Alert, interactive, in no acute distress. HEENT: TMs pearly gray, turbinates moderately edematous without discharge, post-pharynx mildly erythematous. Neck: Supple without lymphadenopathy. Lungs: Clear to auscultation without wheezing, rhonchi or rales. CV: Normal S1, S2 without murmurs. Abdomen: Nondistended, nontender. Skin: Scattered erythematous urticarial type lesions primarily located over the flanks ,  nonvesicular. Extremities:  No clubbing, cyanosis or edema. Neuro:   Grossly intact.  Review of systems:  Review of systems negative except as noted in HPI / PMHx or noted below: Review of Systems  Constitutional: Negative.   HENT: Negative.   Eyes: Negative.   Respiratory: Negative.   Cardiovascular: Negative.   Gastrointestinal: Negative.   Genitourinary: Negative.   Musculoskeletal: Negative.   Skin: Negative.   Neurological: Negative.   Endo/Heme/Allergies: Negative.   Psychiatric/Behavioral: Negative.     Past medical history:  Past Medical History:  Diagnosis Date  . Arthritis    osteoarthritis  . Hyperlipidemia   . Liver hemangioma 03/16/2011  . Postmenopausal HRT (hormone replacement therapy)     Past surgical history:  Past Surgical History:  Procedure Laterality Date  . TUBAL LIGATION    . TUBAL LIGATION      Family history: Family History  Problem Relation Age of Onset  . Ulcerative colitis Brother   . Cancer Mother 5       breast  . Allergic rhinitis Mother   . Urticaria Mother   . Arthritis Other   . Diabetes Other   . Cancer Other        bladder  . Cancer Cousin 65       Stage 3 breast cancer  . COPD Father        lived to be 64  . Bladder Cancer Father     Social history: Social History   Socioeconomic History  . Marital status: Married    Spouse name: Not on file  . Number  of children: 4  . Years of education: Not on file  . Highest education level: Not on file  Occupational History  . Occupation: housewife  Social Needs  . Financial resource strain: Not on file  . Food insecurity:    Worry: Not on file    Inability: Not on file  . Transportation needs:    Medical: Not on file    Non-medical: Not on file  Tobacco Use  . Smoking status: Never Smoker  . Smokeless tobacco: Never Used  Substance and Sexual Activity  . Alcohol use: Yes    Alcohol/week: 1.0 - 2.0 standard drinks    Types: 1 - 2 Glasses of wine per week  .  Drug use: No  . Sexual activity: Yes  Lifestyle  . Physical activity:    Days per week: Not on file    Minutes per session: Not on file  . Stress: Not on file  Relationships  . Social connections:    Talks on phone: Not on file    Gets together: Not on file    Attends religious service: Not on file    Active member of club or organization: Not on file    Attends meetings of clubs or organizations: Not on file    Relationship status: Not on file  . Intimate partner violence:    Fear of current or ex partner: Not on file    Emotionally abused: Not on file    Physically abused: Not on file    Forced sexual activity: Not on file  Other Topics Concern  . Not on file  Social History Narrative   Exercise: sometimes   Caffeine: 2 cups 1/2 and 1/2 coffee daily   6 grandchildren (none of her children are in )   Married         Environmental History: The patient lives in a 36-year-old house with carpeting in the bedroom, gas heat, and central air.  There is no known mold/water damage in the home.  There are no pets in the home.  She is a never smoker.  Allergies as of 09/18/2018   No Known Allergies     Medication List       Accurate as of September 18, 2018  8:22 PM. Always use your most recent med list.        aspirin 81 MG EC tablet Take 81 mg by mouth daily.   BENADRYL ALLERGY PO Take by mouth.   Calcium Carbonate-Vitamin D 600-400 MG-UNIT tablet Take 1 tablet by mouth 2 (two) times daily.   EPINEPHrine 0.3 mg/0.3 mL Soaj injection Commonly known as:  EPI-PEN Inject 0.3 mLs (0.3 mg total) into the muscle as needed for anaphylaxis.   famotidine 20 MG tablet Commonly known as:  PEPCID Take 1 tablet (20 mg total) by mouth 2 (two) times daily.   Fish Oil 1200 MG Caps Take by mouth.   fluticasone 50 MCG/ACT nasal spray Commonly known as:  FLONASE Place 2 sprays into both nostrils daily as needed for allergies or rhinitis.   hydrocortisone cream 1 % Apply 1  application topically 2 (two) times daily.   levocetirizine 5 MG tablet Commonly known as:  XYZAL Take 1 tablet (5 mg total) by mouth 2 (two) times daily.   montelukast 10 MG tablet Commonly known as:  SINGULAIR Take 1 tablet (10 mg total) by mouth at bedtime.   multivitamin tablet Take 1 tablet by mouth daily.   OSTEO BI-FLEX ADV TRIPLE ST Tabs  Take 1 tablet by mouth daily.   pravastatin 20 MG tablet Commonly known as:  PRAVACHOL Take 1 tablet (20 mg total) by mouth daily.   predniSONE 20 MG tablet Commonly known as:  DELTASONE 2 tabs by mouth once daily for 5 days   scopolamine 1 MG/3DAYS Commonly known as:  TRANSDERM-SCOP Place 1 patch (1.5 mg total) onto the skin every 3 (three) days.       Known medication allergies: No Known Allergies  I appreciate the opportunity to take part in Diella's care. Please do not hesitate to contact me with questions.  Sincerely,   R. Edgar Frisk, MD

## 2018-09-18 NOTE — Assessment & Plan Note (Signed)
All perennial and seasonal aeroallergens skin tests were negative despite a positive histamine control.  A prescription has been provided for fluticasone nasal spray, one spray per nostril 1-2 times daily as needed. Proper nasal spray technique has been discussed and demonstrated.  Nasal saline spray (i.e., Simply Saline) or nasal saline lavage (i.e., NeilMed) is recommended as needed and prior to medicated nasal sprays.

## 2018-09-18 NOTE — Assessment & Plan Note (Signed)
Unclear etiology. Skin tests to select food allergens were negative today. NSAIDs and emotional stress commonly exacerbate urticaria but are not the underlying etiology in this case. Physical urticarias are negative by history (i.e. pressure-induced, temperature, vibration, solar, etc.). History and lesions are not consistent with urticaria pigmentosa so I am not suspicious for mastocytosis. There are no concomitant symptoms concerning for anaphylaxis or constitutional symptoms worrisome for an underlying malignancy. We will rule out other potential etiologies with labs. For symptom relief, patient is to take oral antihistamines as directed.  The following labs have been ordered: Anti-thyroglobulin antibody, thyroid peroxidase antibody, tryptase, urea breath test, CBC, CMP, ESR, ANA, and galactose-alpha-1,3-galactose IgE level.  The patient will be called with further recommendations after lab results have returned.  Instructions have been discussed and provided for H1/H2 receptor blockade with titration to find lowest effective dose.  A prescription has been provided for levocetirizine (Xyzal), 5 mg daily as needed.  A prescription has been provided for famotidine (Pepcid), 20 mg twice daily as needed.  A prescription has been provided for montelukast 10 mg daily at bedtime.  Should there be a significant increase or change in symptoms, a journal is to be kept recording any foods eaten, beverages consumed, medications taken within a 6 hour period prior to the onset of symptoms, as well as record activities being performed, and environmental conditions. For any symptoms concerning for anaphylaxis, epinephrine is to be administered and 911 is to be called immediately.

## 2018-09-18 NOTE — Assessment & Plan Note (Signed)
Associated angioedema occurs in up to 50% of patients with chronic urticaria.  Treatment/diagnostic plan as outlined above. 

## 2018-09-20 LAB — H. PYLORI BREATH TEST: H pylori Breath Test: NEGATIVE

## 2018-09-24 LAB — CBC
Hematocrit: 38.3 % (ref 34.0–46.6)
Hemoglobin: 12.6 g/dL (ref 11.1–15.9)
MCH: 30.7 pg (ref 26.6–33.0)
MCHC: 32.9 g/dL (ref 31.5–35.7)
MCV: 93 fL (ref 79–97)
Platelets: 451 10*3/uL — ABNORMAL HIGH (ref 150–450)
RBC: 4.1 x10E6/uL (ref 3.77–5.28)
RDW: 12.2 % (ref 11.7–15.4)
WBC: 8.7 10*3/uL (ref 3.4–10.8)

## 2018-09-24 LAB — ALPHA-GAL PANEL
Alpha Gal IgE*: <0.1 kU/L (ref <0.10)
Beef (Bos spp) IgE: <0.1 kU/L (ref <0.35)
Class Interpretation: 0
Class Interpretation: 0
Class Interpretation: 0
Lamb/Mutton (Ovis spp) IgE: <0.1 kU/L (ref <0.35)
Pork (Sus spp) IgE: <0.1 kU/L (ref <0.35)

## 2018-09-24 LAB — COMPREHENSIVE METABOLIC PANEL
ALT: 13 IU/L (ref 0–32)
AST: 14 IU/L (ref 0–40)
Albumin/Globulin Ratio: 2.3 — ABNORMAL HIGH (ref 1.2–2.2)
Albumin: 4.3 g/dL (ref 3.8–4.8)
Alkaline Phosphatase: 59 IU/L (ref 39–117)
BUN/Creatinine Ratio: 18 (ref 12–28)
BUN: 12 mg/dL (ref 8–27)
Bilirubin Total: 0.2 mg/dL (ref 0.0–1.2)
CO2: 24 mmol/L (ref 20–29)
Calcium: 9.4 mg/dL (ref 8.7–10.3)
Chloride: 102 mmol/L (ref 96–106)
Creatinine, Ser: 0.66 mg/dL (ref 0.57–1.00)
GFR calc Af Amer: 105 mL/min/{1.73_m2} (ref >59)
GFR calc non Af Amer: 91 mL/min/{1.73_m2} (ref >59)
Globulin, Total: 1.9 g/dL (ref 1.5–4.5)
Glucose: 100 mg/dL — ABNORMAL HIGH (ref 65–99)
Potassium: 4.8 mmol/L (ref 3.5–5.2)
Sodium: 141 mmol/L (ref 134–144)
Total Protein: 6.2 g/dL (ref 6.0–8.5)

## 2018-09-24 LAB — SEDIMENTATION RATE: Sed Rate: 8 mm/hr (ref 0–40)

## 2018-09-24 LAB — TRYPTASE: Tryptase: 8.6 ug/L (ref 2.2–13.2)

## 2018-09-24 LAB — THYROID ANTIBODIES
Thyroglobulin Antibody: <1 IU/mL (ref 0.0–0.9)
Thyroperoxidase Ab SerPl-aCnc: 9 IU/mL (ref 0–34)

## 2018-09-24 LAB — ANA: Anti Nuclear Antibody(ANA): NEGATIVE

## 2018-10-16 ENCOUNTER — Telehealth: Payer: Self-pay | Admitting: Allergy and Immunology

## 2018-10-16 MED ORDER — PREDNISONE 10 MG PO TABS: 10.0000 mg | ORAL_TABLET | Freq: Every day | ORAL | 0 refills | Status: AC

## 2018-10-16 NOTE — Telephone Encounter (Signed)
Patient has been experiencing itching and bruising at night. She states that her medication that were prescribed are unfortunately not working for her. She is very uncomfortable and has been taking benadryl and using hydrocortisone cream on her skin. She has been experiencing swelling in her legs as well. She is wondering if she can go back on prednisone to help relieve her symptoms. Please advise.

## 2018-10-16 NOTE — Telephone Encounter (Signed)
Patient was seen 09-18-18. She was prescribed levoceterizine,famotidine, and montolukast for hives. She said it is not working very well, because she is covered in hives. Would like advice on what to do next.

## 2018-10-16 NOTE — Telephone Encounter (Signed)
Please provide the patient with prednisone 10 mg daily x 7 days. Continue and montelukast H1/H2 receptor blockade. Please discuss omalizumab (Xolair) to help better manage her urticaria.  If she is amenable to this option please have her scheduled to come in for a sample injection and submit paperwork to paperwork for continued injections every 28 days.

## 2018-10-16 NOTE — Telephone Encounter (Signed)
Called and spoke with the patient and advised about the prednisone and talked about starting Xolair. Patient stated that she wants to look over the information for Xolair and will call to set up an appointment to receive Xolair samples if interested. A brochure has been mailed to the patient's home for her to review. Prednisone has been sent to the patient's pharmacy.

## 2018-10-18 ENCOUNTER — Telehealth: Payer: Self-pay | Admitting: *Deleted

## 2018-10-18 NOTE — Telephone Encounter (Signed)
Patient reviewed the information about Jocelyn Morgan and she is interested in starting. I informed her that we have put place new restriction in place and would need to discuss with Dr. Verlin Fester if we can go ahead with providing her a sample at this time. Please advise.

## 2018-10-18 NOTE — Telephone Encounter (Signed)
Spoke with patient and scheduled appointment for the [facility] office tomorrow. Informed Logan in [employer] that Dr. Verlin Fester told patient to come in for [facility].

## 2018-10-18 NOTE — Telephone Encounter (Signed)
Please submit for Xolair

## 2018-10-18 NOTE — Telephone Encounter (Signed)
Yes, you may proceed, please ask her to get the injection as early in the day as possible. Have Jocelyn Morgan submit paperwork. Thanks.

## 2018-10-19 ENCOUNTER — Other Ambulatory Visit: Payer: Self-pay

## 2018-10-19 ENCOUNTER — Ambulatory Visit (INDEPENDENT_AMBULATORY_CARE_PROVIDER_SITE_OTHER): Payer: Medicare HMO | Admitting: *Deleted

## 2018-10-19 DIAGNOSIS — L500 Allergic urticaria: Secondary | ICD-10-CM | POA: Diagnosis not present

## 2018-10-19 MED ORDER — OMALIZUMAB 150 MG ~~LOC~~ SOLR
150.0000 mg | Freq: Once | SUBCUTANEOUS | Status: AC
  Administered 2018-10-19: 150 mg via SUBCUTANEOUS

## 2018-10-19 NOTE — Progress Notes (Signed)
Immunotherapy   Patient Details  Name: Jocelyn Morgan MRN: [id number] Date of Birth: 04-23-1951  10/19/2018  Jocelyn Morgan started injections for  Xolair 150 mg sample given in the right arm for hives Epi-Pen:Epi-Pen Available  Consent signed and patient instructions given.  Robie Ridge 10/19/2018, 9:15 AM

## 2018-10-22 MED ORDER — OMALIZUMAB 150 MG ~~LOC~~ SOLR
300.0000 mg | SUBCUTANEOUS | Status: DC
  Administered 2018-11-20 – 2020-03-26 (×18): 300 mg via SUBCUTANEOUS

## 2018-10-22 NOTE — Telephone Encounter (Signed)
Thanks for clarification, I will make sure to make the change

## 2018-10-22 NOTE — Telephone Encounter (Signed)
Pt came in Friday to [facility] shot room for her xolair apt and there was no documentation that I could find on a dosage or frequency. Dr Verlin Fester and Tammy were both not in office that day.

## 2018-10-22 NOTE — Telephone Encounter (Signed)
Not sure why she received 150mg . Yes, I would like for her to receive 300mg  dose going forward. Thanks.

## 2018-10-22 NOTE — Telephone Encounter (Signed)
Just wanted to check with provider. Patient came in and started sample 3/27 150mg  dose.  Do you want patient to continue this dose or the recommended dose of 300mg . I will submit for the 300mg  just in case she dose not respond to 150mg  anyway.

## 2018-10-22 NOTE — Telephone Encounter (Signed)
I understand. 300 mg Keyser every 4 weeks. Thanks.

## 2018-10-22 NOTE — Addendum Note (Signed)
Addended by: Carin Hock on: 10/22/2018 04:30 PM   Modules accepted: Orders

## 2018-10-29 ENCOUNTER — Telehealth: Payer: Self-pay

## 2018-10-29 NOTE — Telephone Encounter (Signed)
Called and spoke with patient and informed her that Dr. Verlin Fester recommends she continue with the Xolair injections. Patient was wanting to know of a dermatologist and after speaking with Karena Addison we gave her the information for Dr. Juel Burrow office.

## 2018-10-29 NOTE — Telephone Encounter (Signed)
Called and spoke with patient and she stated that she has been having hives for couple days. She stated that she was on prednisone and took her last does last Tuesday. She also received a Xolair injection on 10/19/2018. She is also taking her Xyzal twice a day, Pepcid twice a day, her montelukast at bedtime and hydrocortisone cream 3-4 times daily.She stated she was not itchy as bad when she was on prednisone and now that she is off she is itching and they are hurting and have bruising to her lower legs that has been going on for 3 days. Please advise

## 2018-10-29 NOTE — Telephone Encounter (Signed)
Patient verbalized understanding and stated that she does not have a dermatologist and was wondering if you were going to refer her to one and is there one within [place] that you would refer her to. She is also wanting to know if she should continue the Xolair injections? Please advise.

## 2018-10-29 NOTE — Telephone Encounter (Signed)
If she is having purple/black discoloration and the hives are hurting, she needs to have evaluation and a biopsy with a dermatologist to rule out certain etiologies.

## 2018-10-29 NOTE — Telephone Encounter (Signed)
Yes, for now she should continue the Xolair injections. A referral is not necessary, but if she would like one, we can arrange that. If she would like a referral, please discuss with her location and insurance to select the appropriate dermatologist. Thanks.

## 2018-10-29 NOTE — Telephone Encounter (Signed)
Patient called because she is currently having Hives and the patients legs are bruised. She has more bruising in the lower legs that is red & purple and her thighs are black and blue.   Please Advise.

## 2018-10-30 NOTE — Telephone Encounter (Signed)
Patient has called stating that she called the dermatologist They wont schedule anything sooner than June - unless its an urgent referral Patient is wondering if her referral could be made urgent so she can possibly be seen sooner

## 2018-10-31 NOTE — Telephone Encounter (Signed)
Patient is scheduled to see [employer], [place] @ Dr Maximino Greenland office 11/01/2018 @ 9. Patient has been informed and was very happy to get in soon.   Thanks

## 2018-10-31 NOTE — Telephone Encounter (Signed)
Noted,  Thank you!

## 2018-11-01 ENCOUNTER — Encounter: Payer: Self-pay | Admitting: Family

## 2018-11-01 DIAGNOSIS — L988 Other specified disorders of the skin and subcutaneous tissue: Secondary | ICD-10-CM | POA: Diagnosis not present

## 2018-11-01 DIAGNOSIS — L509 Urticaria, unspecified: Secondary | ICD-10-CM

## 2018-11-01 DIAGNOSIS — L508 Other urticaria: Secondary | ICD-10-CM | POA: Diagnosis not present

## 2018-11-01 DIAGNOSIS — R58 Hemorrhage, not elsewhere classified: Secondary | ICD-10-CM

## 2018-11-01 NOTE — Addendum Note (Signed)
Addended by: Debbrah Alar on: 11/01/2018 03:30 PM   Modules accepted: Orders

## 2018-11-12 ENCOUNTER — Encounter: Payer: Self-pay | Admitting: Family

## 2018-11-13 ENCOUNTER — Other Ambulatory Visit (INDEPENDENT_AMBULATORY_CARE_PROVIDER_SITE_OTHER): Payer: Medicare HMO

## 2018-11-13 ENCOUNTER — Other Ambulatory Visit: Payer: Self-pay

## 2018-11-13 DIAGNOSIS — L509 Urticaria, unspecified: Secondary | ICD-10-CM | POA: Diagnosis not present

## 2018-11-13 DIAGNOSIS — R58 Hemorrhage, not elsewhere classified: Secondary | ICD-10-CM | POA: Diagnosis not present

## 2018-11-13 LAB — CBC WITH DIFFERENTIAL/PLATELET
Basophils Absolute: 0 10*3/uL (ref 0.0–0.1)
Basophils Relative: 0.1 % (ref 0.0–3.0)
Eosinophils Absolute: 0.2 10*3/uL (ref 0.0–0.7)
Eosinophils Relative: 2.5 % (ref 0.0–5.0)
HCT: 40.3 % (ref 36.0–46.0)
Hemoglobin: 13.5 g/dL (ref 12.0–15.0)
Lymphocytes Relative: 20.1 % (ref 12.0–46.0)
Lymphs Abs: 1.4 10*3/uL (ref 0.7–4.0)
MCHC: 33.5 g/dL (ref 30.0–36.0)
MCV: 92.1 fl (ref 78.0–100.0)
Monocytes Absolute: 0.5 10*3/uL (ref 0.1–1.0)
Monocytes Relative: 6.7 % (ref 3.0–12.0)
Neutro Abs: 5 10*3/uL (ref 1.4–7.7)
Neutrophils Relative %: 70.6 % (ref 43.0–77.0)
Platelets: 435 10*3/uL — ABNORMAL HIGH (ref 150.0–400.0)
RBC: 4.38 Mil/uL (ref 3.87–5.11)
RDW: 13.2 % (ref 11.5–15.5)
WBC: 7.1 10*3/uL (ref 4.0–10.5)

## 2018-11-13 LAB — COMPREHENSIVE METABOLIC PANEL
ALT: 11 U/L (ref 0–35)
AST: 13 U/L (ref 0–37)
Albumin: 4.4 g/dL (ref 3.5–5.2)
Alkaline Phosphatase: 63 U/L (ref 39–117)
BUN: 13 mg/dL (ref 6–23)
CO2: 27 mEq/L (ref 19–32)
Calcium: 9.6 mg/dL (ref 8.4–10.5)
Chloride: 101 mEq/L (ref 96–112)
Creatinine, Ser: 0.65 mg/dL (ref 0.40–1.20)
GFR: 90.41 mL/min (ref >60.00)
Glucose, Bld: 102 mg/dL — ABNORMAL HIGH (ref 70–99)
Potassium: 4.3 mEq/L (ref 3.5–5.1)
Sodium: 138 mEq/L (ref 135–145)
Total Bilirubin: 0.6 mg/dL (ref 0.2–1.2)
Total Protein: 6.9 g/dL (ref 6.0–8.3)

## 2018-11-14 LAB — RHEUMATOID FACTOR: Rhuematoid fact SerPl-aCnc: <14 IU/mL (ref <14)

## 2018-11-15 ENCOUNTER — Encounter: Payer: Self-pay | Admitting: Family

## 2018-11-16 ENCOUNTER — Ambulatory Visit: Payer: Medicare HMO | Admitting: Family

## 2018-11-16 ENCOUNTER — Ambulatory Visit: Payer: Self-pay

## 2018-11-20 ENCOUNTER — Ambulatory Visit (INDEPENDENT_AMBULATORY_CARE_PROVIDER_SITE_OTHER): Payer: Medicare HMO

## 2018-11-20 ENCOUNTER — Other Ambulatory Visit: Payer: Self-pay

## 2018-11-20 DIAGNOSIS — L500 Allergic urticaria: Secondary | ICD-10-CM

## 2018-12-07 ENCOUNTER — Other Ambulatory Visit: Payer: Self-pay | Admitting: Family

## 2018-12-07 ENCOUNTER — Encounter: Payer: Self-pay | Admitting: Dermatology

## 2018-12-07 DIAGNOSIS — E7800 Pure hypercholesterolemia, unspecified: Secondary | ICD-10-CM

## 2018-12-10 ENCOUNTER — Telehealth (INDEPENDENT_AMBULATORY_CARE_PROVIDER_SITE_OTHER): Payer: Medicare HMO | Admitting: Family

## 2018-12-10 ENCOUNTER — Other Ambulatory Visit: Payer: Self-pay

## 2018-12-10 ENCOUNTER — Telehealth: Payer: Self-pay | Admitting: Family

## 2018-12-10 DIAGNOSIS — L509 Urticaria, unspecified: Secondary | ICD-10-CM

## 2018-12-10 DIAGNOSIS — I10 Essential (primary) hypertension: Secondary | ICD-10-CM | POA: Diagnosis not present

## 2018-12-10 DIAGNOSIS — E785 Hyperlipidemia, unspecified: Secondary | ICD-10-CM | POA: Diagnosis not present

## 2018-12-10 NOTE — Telephone Encounter (Signed)
Return in about 23 weeks (around 05/20/2019) for annual physical.   Left msg on home and cell phone to call back for follow up appt

## 2018-12-10 NOTE — Telephone Encounter (Signed)
Patient is calling in the schedule appointment. Returning call.

## 2018-12-10 NOTE — Progress Notes (Signed)
Virtual Visit via Video Note  I connected with Jocelyn Morgan  on 12/10/18 at 10:00 AM EDT by a video enabled telemedicine application and verified that I am speaking with the correct person using two identifiers. This visit type was conducted due to national recommendations for restrictions regarding the COVID-19 Pandemic (e.g. social distancing).  This format is felt to be most appropriate for this patient at this time.   I discussed the limitations of evaluation and management by telemedicine and the availability of in person appointments. The patient expressed understanding and agreed to proceed.  Only the patient and myself were on today's video visit. The patient was at home and I was in my office at the time of today's visit.   History of Present Illness:    Hyperlipidemia- she continues pravastatin.   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  Reports that she had a biopsy and derm told her to remove. She is being treated with xolair.  Was told that it was causing bruising.  Stopped all supplements.   She continues to have breakthrough hives though they are better on the zolair and hopes that they will improve with a few more months on xolair.   HTN- reports that she does have a home bp cuff but has not been checking recently.  BP Readings from Last 3 Encounters:  09/18/18 132/70  09/16/18 (!) 148/84  08/29/18 136/72    Observations/Objective:    Gen: Awake, alert, no acute distress Resp: Breathing is even and non-labored Psych: calm/pleasant demeanor Neuro: Alert and Oriented x 3, + facial symmetry, speech is clear.    Assessment and Plan:  HTN- she will check her bp once daily for several days and send me her readings via mychart.  Urticaria- some  improvement on xolair/xyzal/singulair. Management per allergy.  Hyperlipidemia- tolerating statin without difficulty. Reinforced importance of low cholesterol diet/exercise.    Follow Up Instructions:    I discussed the assessment and treatment plan with the patient. The patient was provided an opportunity to ask questions and all were answered. The patient agreed with the plan and demonstrated an understanding of the instructions.   The patient was advised to call back or seek an in-person evaluation if the symptoms worsen or if the condition fails to improve as anticipated.    Nance Pear, NP

## 2018-12-18 ENCOUNTER — Other Ambulatory Visit: Payer: Self-pay

## 2018-12-18 ENCOUNTER — Ambulatory Visit (INDEPENDENT_AMBULATORY_CARE_PROVIDER_SITE_OTHER): Payer: Medicare HMO

## 2018-12-18 ENCOUNTER — Ambulatory Visit: Payer: Self-pay

## 2018-12-18 DIAGNOSIS — L501 Idiopathic urticaria: Secondary | ICD-10-CM | POA: Diagnosis not present

## 2018-12-18 DIAGNOSIS — J309 Allergic rhinitis, unspecified: Secondary | ICD-10-CM

## 2018-12-24 ENCOUNTER — Telehealth: Payer: Self-pay | Admitting: *Deleted

## 2018-12-24 NOTE — Telephone Encounter (Signed)
No, typically if there is some form of allergic reaction to a biologic agent the symptom onset occurs within the first hour after the injection, not days after.  This may be coincidental.  Please have her inform us if this continues.  If so, we will discontinue Xolair injections.  Regarding the persistence of hives, what antihistamine regimen is she taking?

## 2018-12-24 NOTE — Telephone Encounter (Signed)
Noted. Thanks.

## 2018-12-24 NOTE — Telephone Encounter (Signed)
Pt called regarding her xolair injections- 2 days after of first xolair inj she had stomach pain, severe cramps and diarrhea and 4 days after her 2nd xolair inj she had the same symptoms again. Pt is wondering if this is a common symptoms from xolair? She is still haves hives daily.

## 2018-12-24 NOTE — Telephone Encounter (Signed)
Pt informed of note. She is taking levocetirizine BID and pepcid BID and montelukast QHS. Her itching is not too bad during the day but she is waking up at night with itching- informed her she can add benadryl for breakthrough hives/itching.

## 2019-01-15 ENCOUNTER — Ambulatory Visit: Payer: Self-pay

## 2019-01-18 ENCOUNTER — Other Ambulatory Visit: Payer: Self-pay

## 2019-01-18 ENCOUNTER — Ambulatory Visit (INDEPENDENT_AMBULATORY_CARE_PROVIDER_SITE_OTHER): Payer: Medicare HMO

## 2019-01-18 DIAGNOSIS — L500 Allergic urticaria: Secondary | ICD-10-CM | POA: Diagnosis not present

## 2019-01-22 DIAGNOSIS — R69 Illness, unspecified: Secondary | ICD-10-CM | POA: Diagnosis not present

## 2019-02-06 ENCOUNTER — Other Ambulatory Visit: Payer: Self-pay | Admitting: *Deleted

## 2019-02-06 MED ORDER — FAMOTIDINE 20 MG PO TABS: 20.0000 mg | ORAL_TABLET | Freq: Two times a day (BID) | ORAL | 1 refills | Status: DC

## 2019-02-11 ENCOUNTER — Ambulatory Visit (INDEPENDENT_AMBULATORY_CARE_PROVIDER_SITE_OTHER): Payer: Medicare HMO

## 2019-02-11 ENCOUNTER — Other Ambulatory Visit: Payer: Self-pay

## 2019-02-11 DIAGNOSIS — L500 Allergic urticaria: Secondary | ICD-10-CM

## 2019-02-14 ENCOUNTER — Telehealth: Payer: Self-pay

## 2019-02-14 MED ORDER — MONTELUKAST SODIUM 10 MG PO TABS: 10.0000 mg | ORAL_TABLET | Freq: Every day | ORAL | 0 refills | Status: DC

## 2019-02-14 NOTE — Telephone Encounter (Signed)
Received fax for 90 day supply of montelukast. Patient was last seen 08/2018. Rx has been sent in.

## 2019-03-08 ENCOUNTER — Other Ambulatory Visit: Payer: Self-pay | Admitting: Family

## 2019-03-08 DIAGNOSIS — E7800 Pure hypercholesterolemia, unspecified: Secondary | ICD-10-CM

## 2019-03-11 ENCOUNTER — Other Ambulatory Visit: Payer: Self-pay

## 2019-03-11 ENCOUNTER — Ambulatory Visit (INDEPENDENT_AMBULATORY_CARE_PROVIDER_SITE_OTHER): Payer: Medicare HMO

## 2019-03-11 ENCOUNTER — Other Ambulatory Visit: Payer: Self-pay | Admitting: Allergy and Immunology

## 2019-03-11 DIAGNOSIS — L500 Allergic urticaria: Secondary | ICD-10-CM

## 2019-03-18 DIAGNOSIS — H5203 Hypermetropia, bilateral: Secondary | ICD-10-CM | POA: Diagnosis not present

## 2019-03-18 DIAGNOSIS — H524 Presbyopia: Secondary | ICD-10-CM | POA: Diagnosis not present

## 2019-03-18 DIAGNOSIS — H52223 Regular astigmatism, bilateral: Secondary | ICD-10-CM | POA: Diagnosis not present

## 2019-04-08 ENCOUNTER — Other Ambulatory Visit: Payer: Self-pay

## 2019-04-08 ENCOUNTER — Ambulatory Visit (INDEPENDENT_AMBULATORY_CARE_PROVIDER_SITE_OTHER): Payer: Medicare HMO

## 2019-04-08 DIAGNOSIS — L500 Allergic urticaria: Secondary | ICD-10-CM

## 2019-04-19 DIAGNOSIS — R69 Illness, unspecified: Secondary | ICD-10-CM | POA: Diagnosis not present

## 2019-05-03 ENCOUNTER — Ambulatory Visit: Payer: Self-pay

## 2019-05-06 ENCOUNTER — Ambulatory Visit: Payer: Self-pay

## 2019-05-13 ENCOUNTER — Other Ambulatory Visit: Payer: Self-pay

## 2019-05-13 ENCOUNTER — Ambulatory Visit (INDEPENDENT_AMBULATORY_CARE_PROVIDER_SITE_OTHER): Payer: Medicare HMO

## 2019-05-13 DIAGNOSIS — L500 Allergic urticaria: Secondary | ICD-10-CM | POA: Diagnosis not present

## 2019-05-21 ENCOUNTER — Encounter: Payer: Medicare HMO | Admitting: Family

## 2019-05-28 ENCOUNTER — Telehealth: Payer: Self-pay

## 2019-05-28 NOTE — Telephone Encounter (Signed)
Left message for pt to call us back to schedule yearly appt with dr bobbitt in dec.

## 2019-06-03 ENCOUNTER — Other Ambulatory Visit: Payer: Self-pay | Admitting: Family

## 2019-06-03 DIAGNOSIS — Z1231 Encounter for screening mammogram for malignant neoplasm of breast: Secondary | ICD-10-CM

## 2019-06-05 ENCOUNTER — Encounter: Payer: Medicare HMO | Admitting: Family

## 2019-06-10 ENCOUNTER — Ambulatory Visit (INDEPENDENT_AMBULATORY_CARE_PROVIDER_SITE_OTHER): Payer: Medicare HMO

## 2019-06-10 ENCOUNTER — Other Ambulatory Visit: Payer: Self-pay

## 2019-06-10 DIAGNOSIS — L500 Allergic urticaria: Secondary | ICD-10-CM

## 2019-06-16 ENCOUNTER — Other Ambulatory Visit: Payer: Self-pay | Admitting: Family

## 2019-06-16 DIAGNOSIS — E7800 Pure hypercholesterolemia, unspecified: Secondary | ICD-10-CM

## 2019-06-24 ENCOUNTER — Other Ambulatory Visit: Payer: Self-pay

## 2019-06-25 ENCOUNTER — Telehealth: Payer: Self-pay | Admitting: Family

## 2019-06-25 ENCOUNTER — Ambulatory Visit (INDEPENDENT_AMBULATORY_CARE_PROVIDER_SITE_OTHER): Payer: Medicare HMO | Admitting: Family

## 2019-06-25 ENCOUNTER — Other Ambulatory Visit: Payer: Self-pay

## 2019-06-25 ENCOUNTER — Encounter: Payer: Self-pay | Admitting: Family

## 2019-06-25 VITALS — BP 137/67 | HR 80 | Temp 96.9°F | Resp 16 | Ht 62.0 in | Wt 137.0 lb

## 2019-06-25 DIAGNOSIS — R519 Headache, unspecified: Secondary | ICD-10-CM | POA: Diagnosis not present

## 2019-06-25 DIAGNOSIS — D473 Essential (hemorrhagic) thrombocythemia: Secondary | ICD-10-CM | POA: Diagnosis not present

## 2019-06-25 DIAGNOSIS — Z0000 Encounter for general adult medical examination without abnormal findings: Secondary | ICD-10-CM

## 2019-06-25 DIAGNOSIS — E785 Hyperlipidemia, unspecified: Secondary | ICD-10-CM | POA: Diagnosis not present

## 2019-06-25 DIAGNOSIS — R739 Hyperglycemia, unspecified: Secondary | ICD-10-CM | POA: Diagnosis not present

## 2019-06-25 DIAGNOSIS — M8580 Other specified disorders of bone density and structure, unspecified site: Secondary | ICD-10-CM | POA: Diagnosis not present

## 2019-06-25 DIAGNOSIS — G5601 Carpal tunnel syndrome, right upper limb: Secondary | ICD-10-CM

## 2019-06-25 DIAGNOSIS — D75839 Thrombocytosis, unspecified: Secondary | ICD-10-CM

## 2019-06-25 LAB — CBC WITH DIFFERENTIAL/PLATELET
Basophils Absolute: 0 10*3/uL (ref 0.0–0.1)
Basophils Relative: 0.2 % (ref 0.0–3.0)
Eosinophils Absolute: 0.2 10*3/uL (ref 0.0–0.7)
Eosinophils Relative: 4 % (ref 0.0–5.0)
HCT: 41.6 % (ref 36.0–46.0)
Hemoglobin: 14 g/dL (ref 12.0–15.0)
Lymphocytes Relative: 25.6 % (ref 12.0–46.0)
Lymphs Abs: 1.2 10*3/uL (ref 0.7–4.0)
MCHC: 33.8 g/dL (ref 30.0–36.0)
MCV: 92.1 fl (ref 78.0–100.0)
Monocytes Absolute: 0.3 10*3/uL (ref 0.1–1.0)
Monocytes Relative: 7 % (ref 3.0–12.0)
Neutro Abs: 3 10*3/uL (ref 1.4–7.7)
Neutrophils Relative %: 63.2 % (ref 43.0–77.0)
Platelets: 346 10*3/uL (ref 150.0–400.0)
RBC: 4.51 Mil/uL (ref 3.87–5.11)
RDW: 13.1 % (ref 11.5–15.5)
WBC: 4.8 10*3/uL (ref 4.0–10.5)

## 2019-06-25 LAB — COMPREHENSIVE METABOLIC PANEL
ALT: 21 U/L (ref 0–35)
AST: 19 U/L (ref 0–37)
Albumin: 4.6 g/dL (ref 3.5–5.2)
Alkaline Phosphatase: 57 U/L (ref 39–117)
BUN: 15 mg/dL (ref 6–23)
CO2: 28 mEq/L (ref 19–32)
Calcium: 9.5 mg/dL (ref 8.4–10.5)
Chloride: 103 mEq/L (ref 96–112)
Creatinine, Ser: 0.65 mg/dL (ref 0.40–1.20)
GFR: 90.25 mL/min (ref >60.00)
Glucose, Bld: 99 mg/dL (ref 70–99)
Potassium: 4.6 mEq/L (ref 3.5–5.1)
Sodium: 140 mEq/L (ref 135–145)
Total Bilirubin: 0.6 mg/dL (ref 0.2–1.2)
Total Protein: 6.8 g/dL (ref 6.0–8.3)

## 2019-06-25 LAB — LDL CHOLESTEROL, DIRECT: Direct LDL: 109 mg/dL

## 2019-06-25 LAB — LIPID PANEL
Cholesterol: 194 mg/dL (ref 0–200)
HDL: 39 mg/dL — ABNORMAL LOW (ref >39.00)
NonHDL: 154.99
Total CHOL/HDL Ratio: 5
Triglycerides: 212 mg/dL — ABNORMAL HIGH (ref 0.0–149.0)
VLDL: 42.4 mg/dL — ABNORMAL HIGH (ref 0.0–40.0)

## 2019-06-25 NOTE — Progress Notes (Signed)
Subjective:    Patient ID: Jocelyn Morgan, female    DOB: 12/25/1950, 68 y.o.   MRN: [id number]  HPI   Patient presents today for complete physical.  Immunizations: up to date Diet: healthy Wt Readings from Last 3 Encounters:  06/25/19 137 lb (62.1 kg)  09/18/18 143 lb (64.9 kg)  09/16/18 141 lb 1.5 oz (64 kg)  Exercise:  4 days a week 40 minutes walking Colonoscopy: 9/17 Dexa: due Pap Smear:  n/a Mammogram: scheduled  Numbness in her right hand. Worse with certain positions.  Does a lot of crafting/sewing.  Right hand dominant.   Waking up in the AM with HA,  Not drinking enough water.   Review of Systems  Constitutional: Negative for unexpected weight change.  HENT: Positive for hearing loss (had a hearing test last year and was told that she was a candidate for hearing aid). Negative for rhinorrhea.   Eyes: Negative for visual disturbance.  Respiratory: Negative for cough.   Cardiovascular: Negative for chest pain.  Genitourinary: Negative for dysuria, frequency and hematuria.  Neurological: Positive for headaches.  Hematological: Negative for adenopathy.  Psychiatric/Behavioral:       No depression symptoms.   Past Medical History:  Diagnosis Date  . Arthritis    osteoarthritis  . Hyperlipidemia   . Liver hemangioma 03/16/2011  . Postmenopausal HRT (hormone replacement therapy)      Social History   Socioeconomic History  . Marital status: Married    Spouse name: Not on file  . Number of children: 4  . Years of education: Not on file  . Highest education level: Not on file  Occupational History  . Occupation: housewife  Social Needs  . Financial resource strain: Not on file  . Food insecurity    Worry: Not on file    Inability: Not on file  . Transportation needs    Medical: Not on file    Non-medical: Not on file  Tobacco Use  . Smoking status: Never Smoker  . Smokeless tobacco: Never Used  Substance and Sexual Activity  . Alcohol use: Yes     Alcohol/week: 1.0 - 2.0 standard drinks    Types: 1 - 2 Glasses of wine per week  . Drug use: No  . Sexual activity: Yes  Lifestyle  . Physical activity    Days per week: Not on file    Minutes per session: Not on file  . Stress: Not on file  Relationships  . Social Herbalist on phone: Not on file    Gets together: Not on file    Attends religious service: Not on file    Active member of club or organization: Not on file    Attends meetings of clubs or organizations: Not on file    Relationship status: Not on file  . Intimate partner violence    Fear of current or ex partner: Not on file    Emotionally abused: Not on file    Physically abused: Not on file    Forced sexual activity: Not on file  Other Topics Concern  . Not on file  Social History Narrative   Exercise: sometimes   Caffeine: 2 cups 1/2 and 1/2 coffee daily   6 grandchildren (none of her children are in [place])   Married          Past Surgical History:  Procedure Laterality Date  . TUBAL LIGATION      Family History  Problem Relation  Age of Onset  . Ulcerative colitis Brother   . Cancer Mother 71       breast  . Allergic rhinitis Mother   . Urticaria Mother   . Arthritis Other   . Diabetes Other   . Cancer Other        bladder  . Cancer Cousin 65       Stage 3 breast cancer  . COPD Father        lived to be 62  . Bladder Cancer Father     No Known Allergies  Current Outpatient Medications on File Prior to Visit  Medication Sig Dispense Refill  . EPINEPHrine 0.3 mg/0.3 mL IJ SOAJ injection Inject 0.3 mLs (0.3 mg total) into the muscle as needed for anaphylaxis. 1 Device 1  . levocetirizine (XYZAL) 5 MG tablet TAKE ONE TABLET BY MOUTH TWICE A DAY 60 tablet 4  . pravastatin (PRAVACHOL) 20 MG tablet TAKE ONE TABLET BY MOUTH DAILY 90 tablet 1   Current Facility-Administered Medications on File Prior to Visit  Medication Dose Route Frequency Provider Last Rate Last Dose  . omalizumab  Arvid Right) injection 300 mg  300 mg Subcutaneous Q28 days Bobbitt, [name], MD   300 mg at 06/10/19 0925    BP 137/67 (BP Location: Right Arm, Patient Position: Sitting, Cuff Size: Small)   Pulse 80   Temp (!) 96.9 F (36.1 C) (Temporal)   Resp 16   Ht 5\' 2"  (1.575 m)   Wt 137 lb (62.1 kg)   LMP 07/25/1998   SpO2 99%   BMI 25.06 kg/m       Objective:   Physical Exam Physical Exam  Constitutional: She is oriented to person, place, and time. She appears well-developed and well-nourished. No distress.  HENT:  Head: Normocephalic and atraumatic.  Right Ear: Tympanic membrane and ear canal normal.  Left Ear: Tympanic membrane and ear canal normal.  Mouth/Throat: Oropharynx is clear and moist.  Eyes: Pupils are equal, round, and reactive to light. No scleral icterus.  Neck: Normal range of motion. No thyromegaly present.  Cardiovascular: Normal rate and regular rhythm.   No murmur heard. Pulmonary/Chest: Effort normal and breath sounds normal. No respiratory distress. He has no wheezes. She has no rales. She exhibits no tenderness.  Abdominal: Soft. Bowel sounds are normal. She exhibits no distension and no mass. There is no tenderness. There is no rebound and no guarding.  Musculoskeletal: She exhibits no edema.  Lymphadenopathy:    She has no cervical adenopathy.  Neurological: She is alert and oriented to person, place, and time.neg tinnel, neg phalans She has normal patellar reflexes. She exhibits normal muscle tone. Coordination normal.  Skin: Skin is warm and dry.  Psychiatric: She has a normal mood and affect. Her behavior is normal. Judgment and thought content normal.  Breast/pelvic: deferred          Assessment & Plan:   Preventative care- encouraged pt to continue healthy diet, regular exercise. Immunizations reviewed and up to date. Colo up to date. mammo scheduled. Order follow up Dexa.  R hand pain- suspect carpal tunnel. She has a wrist brace, I have  encouraged her to wear this as much as possible for the next few weeks and let me know if symptoms fail to improve.   Headaches- admits to not drinking enough water.  Encouraged her to increase her water intake and let me know if her Ha's fail to improve.  Assessment & Plan:

## 2019-06-25 NOTE — Patient Instructions (Addendum)
Wear your wrist brace as much as possible. Let me know if your hand numbness does not improve. Complete mammogram as scheduled. Complete lab work prior to leaving. Keep up the good work with regular exercise and healthy diet.

## 2019-06-25 NOTE — Telephone Encounter (Signed)
Opened in error

## 2019-06-26 ENCOUNTER — Encounter: Payer: Self-pay | Admitting: Family

## 2019-06-27 ENCOUNTER — Other Ambulatory Visit: Payer: Self-pay

## 2019-06-27 ENCOUNTER — Ambulatory Visit (INDEPENDENT_AMBULATORY_CARE_PROVIDER_SITE_OTHER): Payer: Medicare HMO

## 2019-06-27 DIAGNOSIS — Z1231 Encounter for screening mammogram for malignant neoplasm of breast: Secondary | ICD-10-CM

## 2019-07-03 ENCOUNTER — Other Ambulatory Visit: Payer: Self-pay

## 2019-07-03 ENCOUNTER — Telehealth: Payer: Self-pay | Admitting: Family

## 2019-07-03 ENCOUNTER — Ambulatory Visit (INDEPENDENT_AMBULATORY_CARE_PROVIDER_SITE_OTHER): Payer: Medicare HMO

## 2019-07-03 DIAGNOSIS — M85852 Other specified disorders of bone density and structure, left thigh: Secondary | ICD-10-CM | POA: Diagnosis not present

## 2019-07-03 DIAGNOSIS — M8580 Other specified disorders of bone density and structure, unspecified site: Secondary | ICD-10-CM

## 2019-07-03 DIAGNOSIS — Z13820 Encounter for screening for osteoporosis: Secondary | ICD-10-CM | POA: Diagnosis not present

## 2019-07-03 DIAGNOSIS — M810 Age-related osteoporosis without current pathological fracture: Secondary | ICD-10-CM | POA: Diagnosis not present

## 2019-07-03 DIAGNOSIS — Z780 Asymptomatic menopausal state: Secondary | ICD-10-CM | POA: Diagnosis not present

## 2019-07-03 MED ORDER — ALENDRONATE SODIUM 70 MG PO TABS: 70.0000 mg | ORAL_TABLET | ORAL | 11 refills | Status: DC

## 2019-07-03 NOTE — Telephone Encounter (Signed)
Please contact pt and let her know that I reviewed her bone density and it is showing osteoporosis which has progressed since her last bone density in 2017. I would recommend that she add fosamax 70mg  once weekly and that she take in the AM and sit upright for 90 minutes after taking.   She should continue calcium supplement and regular exercise for bone health.

## 2019-07-04 ENCOUNTER — Telehealth: Payer: Self-pay

## 2019-07-04 ENCOUNTER — Encounter: Payer: Self-pay | Admitting: Family

## 2019-07-04 NOTE — Telephone Encounter (Signed)
It does not appear that there are any drug interactions with either Xolair or levocetirizine. Thanks.

## 2019-07-04 NOTE — Telephone Encounter (Signed)
Routed response from Dr. Verlin Fester to pt via [place] message.

## 2019-07-04 NOTE — Telephone Encounter (Signed)
Patient advised of results and provider's advise. She verbalized understanding. She has been off the calcium but will re-start and will also start exercising.

## 2019-07-04 NOTE — Telephone Encounter (Signed)
Dr. Verlin Fester, I have been prescribed alendronate 70 MG tablet(Fosamax) for osteoporosis.  Before I start taking I wanted to make sure that this would not cause any problems with the monthly Xolair injections I am taking for chronic hives.  Also  I am only taking the levocetirizine 5 mg 1x a day in the evening.  Thank you Jocelyn Morgan

## 2019-07-08 ENCOUNTER — Other Ambulatory Visit: Payer: Self-pay

## 2019-07-08 ENCOUNTER — Ambulatory Visit (INDEPENDENT_AMBULATORY_CARE_PROVIDER_SITE_OTHER): Payer: Medicare HMO

## 2019-07-08 DIAGNOSIS — L500 Allergic urticaria: Secondary | ICD-10-CM | POA: Diagnosis not present

## 2019-08-02 ENCOUNTER — Telehealth: Payer: Self-pay

## 2019-08-02 NOTE — Telephone Encounter (Signed)
Patient called.  Stated her pharmacy [facility]) called her yesterday afternoon and could not deliver her Xolair to [place] as the address of [place] in [place] was not a valid address.  ([employer] could not deliver the medication.)  They wanted to mail Xolair out to patients home address.  Patient refused.  She called today to see if her Xolair had came in.  No Xolair in [facility] for patient as of 2:17 pm today.  Patient asked if Tammy could call [employer] and get correct shipping information to them.  Patient has an appointment on Monday 08/05/2019 to get Xolair. Informed patient I would send Tammy a note regarding issue.  Please call patient once Xolair arrives.

## 2019-08-05 ENCOUNTER — Other Ambulatory Visit: Payer: Self-pay

## 2019-08-05 ENCOUNTER — Ambulatory Visit (INDEPENDENT_AMBULATORY_CARE_PROVIDER_SITE_OTHER): Payer: Medicare HMO

## 2019-08-05 DIAGNOSIS — L500 Allergic urticaria: Secondary | ICD-10-CM | POA: Diagnosis not present

## 2019-08-05 NOTE — Telephone Encounter (Signed)
Xolair was delivered on Friday and patient coming in today for injection

## 2019-08-07 ENCOUNTER — Other Ambulatory Visit: Payer: Self-pay | Admitting: Allergy and Immunology

## 2019-08-12 ENCOUNTER — Telehealth: Payer: Self-pay

## 2019-08-12 NOTE — Telephone Encounter (Signed)
Informed pt of what dr bobbitt stated, she stated her understanding and thankful we called her back.

## 2019-08-12 NOTE — Telephone Encounter (Signed)
As long as the injections are not within 48-72 hours of each other. If you have symptoms after the COVID inection wait for 48 hours after the symptoms have resolved before getting the Xolair injection.  Thanks.

## 2019-08-12 NOTE — Telephone Encounter (Signed)
[name], [name], MD 2 days ago  MD Dr. 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  I will be eligible to receive the COVID 19 vaccine and wanted to know what information  I need and if I can  receive the vaccine while getting the Xolair injections.  Thank you for your time.  Jocelyn Morgan

## 2019-08-18 ENCOUNTER — Ambulatory Visit: Payer: Medicare HMO | Attending: Internal Medicine

## 2019-08-18 DIAGNOSIS — Z23 Encounter for immunization: Secondary | ICD-10-CM

## 2019-08-18 NOTE — Progress Notes (Signed)
   Covid-19 Vaccination Clinic  Name:  Jocelyn Morgan    MRN: [id number] DOB: 1951-01-18  08/18/2019  [name] was observed post Covid-19 immunization for 15 minutes without incidence. She was provided with Vaccine Information Sheet and instruction to access the V-Safe system.   [name] was instructed to call 911 with any severe reactions post vaccine: Marland Kitchen Difficulty breathing  . Swelling of your face and throat  . A fast heartbeat  . A bad rash all over your body  . Dizziness and weakness    Immunizations Administered    Name Date Dose VIS Date Route   Pfizer COVID-19 Vaccine 08/18/2019 10:45 AM 0.3 mL 07/05/2019 Intramuscular   Manufacturer: [place]   Lot: [id number]   [place]: [id number]

## 2019-09-02 ENCOUNTER — Other Ambulatory Visit: Payer: Self-pay

## 2019-09-02 ENCOUNTER — Ambulatory Visit (INDEPENDENT_AMBULATORY_CARE_PROVIDER_SITE_OTHER): Payer: Medicare HMO

## 2019-09-02 DIAGNOSIS — L501 Idiopathic urticaria: Secondary | ICD-10-CM | POA: Diagnosis not present

## 2019-09-02 DIAGNOSIS — J309 Allergic rhinitis, unspecified: Secondary | ICD-10-CM

## 2019-09-08 ENCOUNTER — Other Ambulatory Visit: Payer: Self-pay | Admitting: Allergy and Immunology

## 2019-09-08 ENCOUNTER — Other Ambulatory Visit: Payer: Self-pay | Admitting: Family

## 2019-09-08 DIAGNOSIS — E7800 Pure hypercholesterolemia, unspecified: Secondary | ICD-10-CM

## 2019-09-09 ENCOUNTER — Ambulatory Visit: Payer: Medicare HMO | Attending: Internal Medicine

## 2019-09-09 DIAGNOSIS — Z23 Encounter for immunization: Secondary | ICD-10-CM | POA: Insufficient documentation

## 2019-09-09 NOTE — Progress Notes (Signed)
   Covid-19 Vaccination Clinic  Name:  Jocelyn Morgan    MRN: [id number] DOB: 1950-11-21  09/09/2019  Jocelyn Morgan was observed post Covid-19 immunization for 15 minutes without incidence. She was provided with Vaccine Information Sheet and instruction to access the V-Safe system.   Jocelyn Morgan was instructed to call 911 with any severe reactions post vaccine: Marland Kitchen Difficulty breathing  . Swelling of your face and throat  . A fast heartbeat  . A bad rash all over your body  . Dizziness and weakness    Immunizations Administered    Name Date Dose VIS Date Route   Pfizer COVID-19 Vaccine 09/09/2019  8:37 AM 0.3 mL 07/05/2019 Intramuscular   Manufacturer: [place]   Lot: [id number]   [place]: [id number]

## 2019-09-10 ENCOUNTER — Encounter: Payer: Self-pay | Admitting: Family

## 2019-09-10 DIAGNOSIS — E7800 Pure hypercholesterolemia, unspecified: Secondary | ICD-10-CM

## 2019-09-10 MED ORDER — PRAVASTATIN SODIUM 20 MG PO TABS: 20.0000 mg | ORAL_TABLET | Freq: Every day | ORAL | 1 refills | Status: DC

## 2019-09-19 ENCOUNTER — Ambulatory Visit: Payer: Medicare HMO | Admitting: Allergy and Immunology

## 2019-09-19 ENCOUNTER — Encounter: Payer: Self-pay | Admitting: Allergy and Immunology

## 2019-09-19 ENCOUNTER — Other Ambulatory Visit: Payer: Self-pay

## 2019-09-19 DIAGNOSIS — L501 Idiopathic urticaria: Secondary | ICD-10-CM | POA: Insufficient documentation

## 2019-09-19 MED ORDER — EPINEPHRINE 0.3 MG/0.3ML IJ SOAJ: 0.3000 mg | INTRAMUSCULAR | 1 refills | Status: DC | PRN

## 2019-09-19 NOTE — Progress Notes (Signed)
    Follow-up Note  RE: Jocelyn Morgan MRN: [id number] DOB: 27-Aug-1950 Date of Office Visit: 09/19/2019  Primary care provider: Debbrah Alar, NP Referring provider: Debbrah Alar, NP  History of present illness: Jocelyn Morgan is a 69 y.o. female with chronic idiopathic urticaria/angioedema and rhinitis presenting today for follow-up.  She was previously seen in this clinic for her initial evaluation in February 2020.  She started Xolair 300 mg injections every 28 days in March 2020.  She reports that since August 2020 she has had no recurrence of hives.  She has not required antihistamines.  She reports that she occasionally has mild itching without development of an urticarial lesion, however the pruritus is not significant enough to warrant antihistamine use.  Assessment and plan: Chronic idiopathic urticaria Urticaria has been well controlled while on Xolair 300 mg injections every 28 days.  For now, continue Xolair injections as prescribed.  Should urticaria recur, restart H1/H2 receptor blockade, titrating to the lowest effective dose necessary to suppress urticaria.  If the patient has remained symptom free when I see her back in August, we will consider holding Xolair to assess for remission.   Meds ordered this encounter  Medications  . EPINEPHrine 0.3 mg/0.3 mL IJ SOAJ injection    Sig: Inject 0.3 mLs (0.3 mg total) into the muscle as needed for anaphylaxis.    Dispense:  2 each    Refill:  1    Physical examination: Blood pressure 122/72, pulse 88, temperature 98.1 F (36.7 C), temperature source Temporal, resp. rate 16, height 5' 1.5" (1.562 m), weight 134 lb 3.2 oz (60.9 kg), last menstrual period 07/25/1998, SpO2 96 %.  General: Alert, interactive, in no acute distress. Neck: Supple without lymphadenopathy. Lungs: Clear to auscultation without wheezing, rhonchi or rales. CV: Normal S1, S2 without murmurs. Skin: Warm and dry, without lesions or  rashes.  The following portions of the patient's history were reviewed and updated as appropriate: allergies, current medications, past family history, past medical history, past social history, past surgical history and problem list.  Current Outpatient Medications  Medication Sig Dispense Refill  . alendronate (FOSAMAX) 70 MG tablet Take 1 tablet (70 mg total) by mouth every 7 (seven) days. Take with a full glass of water on an empty stomach. 4 tablet 11  . Calcium Carbonate (CALCIUM 600 PO) Take by mouth 2 (two) times daily.    . Cholecalciferol (VITAMIN D3) 10 MCG (400 UNIT) CAPS Take 400 Units by mouth daily.    Marland Kitchen EPINEPHrine 0.3 mg/0.3 mL IJ SOAJ injection Inject 0.3 mLs (0.3 mg total) into the muscle as needed for anaphylaxis. 2 each 1  . pravastatin (PRAVACHOL) 20 MG tablet Take 1 tablet (20 mg total) by mouth daily. 90 tablet 1  . levocetirizine (XYZAL) 5 MG tablet TAKE ONE TABLET BY MOUTH TWICE A DAY 60 tablet 4   Current Facility-Administered Medications  Medication Dose Route Frequency Provider Last Rate Last Admin  . omalizumab Arvid Right) injection 300 mg  300 mg Subcutaneous Q28 days [place], [name], MD   300 mg at 09/02/19 1036    No Known Allergies  I appreciate the opportunity to take part in Stepanie's care. Please do not hesitate to contact me with questions.  Sincerely,   R. Edgar Frisk, MD

## 2019-09-19 NOTE — Assessment & Plan Note (Addendum)
Urticaria has been well controlled while on Xolair 300 mg injections every 28 days.  For now, continue Xolair injections as prescribed.  Should urticaria recur, restart H1/H2 receptor blockade, titrating to the lowest effective dose necessary to suppress urticaria.  If the patient has remained symptom free when I see her back in August, we will consider holding Xolair to assess for remission.

## 2019-09-19 NOTE — Patient Instructions (Addendum)
Chronic idiopathic urticaria Urticaria has been well controlled while on Xolair 300 mg injections every 28 days.  For now, continue Xolair injections as prescribed.  Should urticaria recur, restart H1/H2 receptor blockade, titrating to the lowest effective dose necessary to suppress urticaria.  If the patient has remained symptom free when I see her back in August, we will consider holding Xolair to assess for remission.   Return in about 6 months (around 03/18/2020), or if symptoms worsen or fail to improve.

## 2019-09-30 ENCOUNTER — Other Ambulatory Visit: Payer: Self-pay

## 2019-09-30 ENCOUNTER — Telehealth: Payer: Self-pay | Admitting: Family

## 2019-09-30 ENCOUNTER — Ambulatory Visit (INDEPENDENT_AMBULATORY_CARE_PROVIDER_SITE_OTHER): Payer: Medicare HMO

## 2019-09-30 DIAGNOSIS — L501 Idiopathic urticaria: Secondary | ICD-10-CM

## 2019-09-30 NOTE — Telephone Encounter (Signed)
Left message for patient to call office back to scheduled Medicare Annual Wellness.   Last AWV 04/2016; please schedule anytime  with Nurse Health Advisor Naaman Plummer, RN at [employer].

## 2019-10-14 DIAGNOSIS — R69 Illness, unspecified: Secondary | ICD-10-CM | POA: Diagnosis not present

## 2019-10-29 ENCOUNTER — Other Ambulatory Visit: Payer: Self-pay

## 2019-10-29 ENCOUNTER — Ambulatory Visit (INDEPENDENT_AMBULATORY_CARE_PROVIDER_SITE_OTHER): Payer: Medicare HMO

## 2019-10-29 DIAGNOSIS — L501 Idiopathic urticaria: Secondary | ICD-10-CM

## 2019-11-13 ENCOUNTER — Other Ambulatory Visit: Payer: Self-pay | Admitting: *Deleted

## 2019-11-13 MED ORDER — OMALIZUMAB 150 MG ~~LOC~~ SOLR: 300.0000 mg | SUBCUTANEOUS | 11 refills | Status: DC

## 2019-11-26 ENCOUNTER — Other Ambulatory Visit: Payer: Self-pay

## 2019-11-26 ENCOUNTER — Ambulatory Visit (INDEPENDENT_AMBULATORY_CARE_PROVIDER_SITE_OTHER): Payer: Medicare HMO

## 2019-11-26 DIAGNOSIS — L500 Allergic urticaria: Secondary | ICD-10-CM

## 2019-12-24 ENCOUNTER — Ambulatory Visit (INDEPENDENT_AMBULATORY_CARE_PROVIDER_SITE_OTHER): Payer: Medicare HMO

## 2019-12-24 ENCOUNTER — Other Ambulatory Visit: Payer: Self-pay

## 2019-12-24 DIAGNOSIS — L500 Allergic urticaria: Secondary | ICD-10-CM | POA: Diagnosis not present

## 2020-01-16 ENCOUNTER — Other Ambulatory Visit: Payer: Self-pay | Admitting: Allergy and Immunology

## 2020-01-21 ENCOUNTER — Ambulatory Visit: Payer: Medicare HMO

## 2020-01-24 ENCOUNTER — Ambulatory Visit: Payer: Medicare HMO

## 2020-01-28 ENCOUNTER — Ambulatory Visit (INDEPENDENT_AMBULATORY_CARE_PROVIDER_SITE_OTHER): Payer: Medicare HMO

## 2020-01-28 DIAGNOSIS — L500 Allergic urticaria: Secondary | ICD-10-CM | POA: Diagnosis not present

## 2020-02-25 ENCOUNTER — Ambulatory Visit: Payer: Medicare HMO

## 2020-02-27 ENCOUNTER — Other Ambulatory Visit: Payer: Self-pay

## 2020-02-27 ENCOUNTER — Ambulatory Visit (INDEPENDENT_AMBULATORY_CARE_PROVIDER_SITE_OTHER): Payer: Medicare HMO

## 2020-02-27 DIAGNOSIS — L500 Allergic urticaria: Secondary | ICD-10-CM | POA: Diagnosis not present

## 2020-03-05 ENCOUNTER — Other Ambulatory Visit: Payer: Self-pay | Admitting: Family

## 2020-03-05 DIAGNOSIS — E7800 Pure hypercholesterolemia, unspecified: Secondary | ICD-10-CM

## 2020-03-17 ENCOUNTER — Encounter: Payer: Self-pay | Admitting: Allergy and Immunology

## 2020-03-17 ENCOUNTER — Other Ambulatory Visit: Payer: Self-pay

## 2020-03-17 ENCOUNTER — Ambulatory Visit: Payer: Medicare HMO | Admitting: Allergy and Immunology

## 2020-03-17 DIAGNOSIS — L501 Idiopathic urticaria: Secondary | ICD-10-CM

## 2020-03-17 NOTE — Patient Instructions (Addendum)
Chronic idiopathic urticaria Complete suppression of urticaria with Xolair over the past year.  On the chance that the urticaria may be in remission unrelated to Xolair, after the next Xolair injection in early September, we will plan to hold injections for several months to see if the urticaria returns.  Should urticaria recur, restart H1/H2 receptor blockade, titrating to the lowest effective dose necessary to suppress urticaria.   Return in about 6 months (around 09/17/2020), or if symptoms worsen or fail to improve.

## 2020-03-17 NOTE — Assessment & Plan Note (Signed)
Complete suppression of urticaria with Xolair over the past year.  On the chance that the urticaria may be in remission unrelated to Xolair, after the next Xolair injection in early September, we will plan to hold injections for several months to see if the urticaria returns.  Should urticaria recur, restart H1/H2 receptor blockade, titrating to the lowest effective dose necessary to suppress urticaria.

## 2020-03-17 NOTE — Progress Notes (Signed)
    Follow-up Note  RE: CARIE KAPUSCINSKI MRN: [id number] DOB: 08-Sep-1950 Date of Office Visit: 03/17/2020  Primary care provider: Debbrah Alar, NP Referring provider: Debbrah Alar, NP  History of present illness: Jocelyn Morgan is a 69 y.o. female with chronic idiopathic urticaria presenting today for follow-up.  She was last seen in this clinic on September 19, 2019.  She reports that since starting Xolair over 1 year ago she has had no recurrence of hives and has not required antihistamines.  She had been experiencing hives for 3 to 4 months prior to her initial Xolair injection and has had no recurrence since that time.  Her next injection is scheduled for March 26, 2020.  Assessment and plan: Chronic idiopathic urticaria Complete suppression of urticaria with Xolair over the past year.  On the chance that the urticaria may be in remission unrelated to Xolair, after the next Xolair injection in early September, we will plan to hold injections for several months to see if the urticaria returns.  Should urticaria recur, restart H1/H2 receptor blockade, titrating to the lowest effective dose necessary to suppress urticaria.   Physical examination: Blood pressure 138/80, pulse 80, temperature 99 F (37.2 C), temperature source Oral, resp. rate 20, height 5' 1.61" (1.565 m), weight 127 lb 10.3 oz (57.9 kg), last menstrual period 07/25/1998, SpO2 96 %.  General: Alert, interactive, in no acute distress. Neck: Supple without lymphadenopathy. Lungs: Clear to auscultation without wheezing, rhonchi or rales. CV: Normal S1, S2 without murmurs. Skin: Warm and dry, without lesions or rashes.  The following portions of the patient's history were reviewed and updated as appropriate: allergies, current medications, past family history, past medical history, past social history, past surgical history and problem list.  Current Outpatient Medications  Medication Sig Dispense  Refill  . alendronate (FOSAMAX) 70 MG tablet Take 1 tablet (70 mg total) by mouth every 7 (seven) days. Take with a full glass of water on an empty stomach. 4 tablet 11  . Calcium Carbonate (CALCIUM 600 PO) Take by mouth 2 (two) times daily.    . Cholecalciferol (VITAMIN D3) 10 MCG (400 UNIT) CAPS Take 400 Units by mouth daily.    Marland Kitchen EPINEPHrine 0.3 mg/0.3 mL IJ SOAJ injection Inject 0.3 mLs (0.3 mg total) into the muscle as needed for anaphylaxis. 2 each 1  . Misc Natural Products (JOINT HEALTH PO) daily.    Marland Kitchen omalizumab (XOLAIR) 150 MG injection Inject 300 mg into the skin every 28 (twenty-eight) days. 2 each 11  . pravastatin (PRAVACHOL) 20 MG tablet TAKE ONE TABLET BY MOUTH DAILY 90 tablet 1  . levocetirizine (XYZAL) 5 MG tablet TAKE ONE TABLET BY MOUTH TWICE A DAY 60 tablet 2   Current Facility-Administered Medications  Medication Dose Route Frequency Provider Last Rate Last Admin  . omalizumab Arvid Right) injection 300 mg  300 mg Subcutaneous Q28 days Dyanara Cozza, [name], MD   300 mg at 02/27/20 1338    No Known Allergies  I appreciate the opportunity to take part in Romelle's care. Please do not hesitate to contact me with questions.  Sincerely,   R. Edgar Frisk, MD

## 2020-03-18 ENCOUNTER — Ambulatory Visit: Payer: Medicare HMO | Admitting: Allergy and Immunology

## 2020-03-18 DIAGNOSIS — H5000 Unspecified esotropia: Secondary | ICD-10-CM | POA: Diagnosis not present

## 2020-03-18 DIAGNOSIS — H02834 Dermatochalasis of left upper eyelid: Secondary | ICD-10-CM | POA: Diagnosis not present

## 2020-03-18 DIAGNOSIS — H5203 Hypermetropia, bilateral: Secondary | ICD-10-CM | POA: Diagnosis not present

## 2020-03-18 DIAGNOSIS — H524 Presbyopia: Secondary | ICD-10-CM | POA: Diagnosis not present

## 2020-03-18 DIAGNOSIS — H04123 Dry eye syndrome of bilateral lacrimal glands: Secondary | ICD-10-CM | POA: Diagnosis not present

## 2020-03-18 DIAGNOSIS — H52223 Regular astigmatism, bilateral: Secondary | ICD-10-CM | POA: Diagnosis not present

## 2020-03-18 DIAGNOSIS — H02831 Dermatochalasis of right upper eyelid: Secondary | ICD-10-CM | POA: Diagnosis not present

## 2020-03-26 ENCOUNTER — Ambulatory Visit (INDEPENDENT_AMBULATORY_CARE_PROVIDER_SITE_OTHER): Payer: Medicare HMO

## 2020-03-26 ENCOUNTER — Other Ambulatory Visit: Payer: Self-pay

## 2020-03-26 DIAGNOSIS — L500 Allergic urticaria: Secondary | ICD-10-CM | POA: Diagnosis not present

## 2020-04-15 DIAGNOSIS — Z0100 Encounter for examination of eyes and vision without abnormal findings: Secondary | ICD-10-CM | POA: Diagnosis not present

## 2020-04-22 DIAGNOSIS — R69 Illness, unspecified: Secondary | ICD-10-CM | POA: Diagnosis not present

## 2020-05-05 DIAGNOSIS — R69 Illness, unspecified: Secondary | ICD-10-CM | POA: Diagnosis not present

## 2020-05-18 ENCOUNTER — Other Ambulatory Visit: Payer: Self-pay | Admitting: Family

## 2020-06-22 ENCOUNTER — Other Ambulatory Visit: Payer: Self-pay | Admitting: Family

## 2020-06-22 DIAGNOSIS — Z1231 Encounter for screening mammogram for malignant neoplasm of breast: Secondary | ICD-10-CM

## 2020-06-24 ENCOUNTER — Encounter: Payer: Medicare HMO | Admitting: Family

## 2020-07-08 ENCOUNTER — Other Ambulatory Visit: Payer: Self-pay

## 2020-07-08 ENCOUNTER — Ambulatory Visit (INDEPENDENT_AMBULATORY_CARE_PROVIDER_SITE_OTHER): Payer: Medicare HMO | Admitting: Family

## 2020-07-08 ENCOUNTER — Encounter: Payer: Self-pay | Admitting: Family

## 2020-07-08 VITALS — BP 114/70 | HR 82 | Temp 98.5°F | Resp 16 | Ht 62.0 in | Wt 124.8 lb

## 2020-07-08 DIAGNOSIS — E785 Hyperlipidemia, unspecified: Secondary | ICD-10-CM

## 2020-07-08 DIAGNOSIS — Z0000 Encounter for general adult medical examination without abnormal findings: Secondary | ICD-10-CM

## 2020-07-08 LAB — COMPREHENSIVE METABOLIC PANEL
ALT: 13 U/L (ref 0–35)
AST: 15 U/L (ref 0–37)
Albumin: 4.8 g/dL (ref 3.5–5.2)
Alkaline Phosphatase: 37 U/L — ABNORMAL LOW (ref 39–117)
BUN: 13 mg/dL (ref 6–23)
CO2: 30 mEq/L (ref 19–32)
Calcium: 9.5 mg/dL (ref 8.4–10.5)
Chloride: 103 mEq/L (ref 96–112)
Creatinine, Ser: 0.64 mg/dL (ref 0.40–1.20)
GFR: 89.48 mL/min (ref >60.00)
Glucose, Bld: 95 mg/dL (ref 70–99)
Potassium: 4.1 mEq/L (ref 3.5–5.1)
Sodium: 139 mEq/L (ref 135–145)
Total Bilirubin: 0.6 mg/dL (ref 0.2–1.2)
Total Protein: 7.1 g/dL (ref 6.0–8.3)

## 2020-07-08 LAB — LIPID PANEL
Cholesterol: 186 mg/dL (ref 0–200)
HDL: 48.1 mg/dL (ref >39.00)
LDL Cholesterol: 114 mg/dL — ABNORMAL HIGH (ref 0–99)
NonHDL: 137.68
Total CHOL/HDL Ratio: 4
Triglycerides: 119 mg/dL (ref 0.0–149.0)
VLDL: 23.8 mg/dL (ref 0.0–40.0)

## 2020-07-08 NOTE — Progress Notes (Signed)
Subjective:    Patient ID: Jocelyn Morgan, female    DOB: Oct 14, 1950, 69 y.o.   MRN: [id number]  HPI  Patient presents today for complete physical.  Immunizations: 3 pfizers, flu shot , pneumovax 23, tetanus and shingrix up to date.  Diet: healthy- she is walking 5 days a week 30-40 minutes.   Wt Readings from Last 3 Encounters:  07/08/20 124 lb 12.8 oz (56.6 kg)  03/17/20 127 lb 10.3 oz (57.9 kg)  09/19/19 134 lb 3.2 oz (60.9 kg)  Colonoscopy: 2017 Dexa: 2020 Pap Smear: N/A Mammogram:scheduled  Review of Systems  Constitutional: Negative for unexpected weight change.  HENT: Positive for hearing loss (planning to get a hearing aid). Negative for rhinorrhea.   Eyes: Negative for visual disturbance.  Respiratory: Negative for cough and shortness of breath.   Cardiovascular: Negative for chest pain.  Gastrointestinal: Negative for blood in stool, constipation and diarrhea.  Genitourinary: Negative for dysuria, frequency and hematuria.  Musculoskeletal: Positive for arthralgias (thumbs, low back, somr groin pain that she attribues to walkin (? deep hip pain)- improving).  Skin: Negative for rash.  Neurological: Negative for headaches.  Hematological: Negative for adenopathy.  Psychiatric/Behavioral:       Denies depression/anxiety   Past Medical History:  Diagnosis Date   Arthritis    osteoarthritis   Hyperlipidemia    Liver hemangioma 03/16/2011   Postmenopausal HRT (hormone replacement therapy)      Social History   Socioeconomic History   Marital status: Married    Spouse name: Not on file   Number of children: 4   Years of education: Not on file   Highest education level: Not on file  Occupational History   Occupation: housewife  Tobacco Use   Smoking status: Never Smoker   Smokeless tobacco: Never Used  Scientific laboratory technician Use: Never used  Substance and Sexual Activity   Alcohol use: Yes    Alcohol/week: 1.0 - 2.0 standard drink    Types:  1 - 2 Glasses of wine per week   Drug use: No   Sexual activity: Yes  Other Topics Concern   Not on file  Social History Narrative   Exercise: sometimes   Caffeine: 2 cups 1/2 and 1/2 coffee daily   6 grandchildren (none of her children are in )   Married         Social Determinants of Radio broadcast assistant Strain: Not on file  Food Insecurity: Not on file  Transportation Needs: Not on file  Physical Activity: Not on file  Stress: Not on file  Social Connections: Not on file  Intimate Partner Violence: Not on file    Past Surgical History:  Procedure Laterality Date   TUBAL LIGATION      Family History  Problem Relation Age of Onset   Ulcerative colitis Brother    Cancer Mother 61       breast   Allergic rhinitis Mother    Urticaria Mother    Arthritis Other    Diabetes Other    Cancer Other        bladder   Cancer Cousin 65       Stage 3 breast cancer   COPD Father        lived to be 26   Bladder Cancer Father     No Known Allergies  Current Outpatient Medications on File Prior to Visit  Medication Sig Dispense Refill   alendronate (FOSAMAX) 70 MG tablet  TAKE 1 TABLET BY MOUTH ONCE WEEKLY ON AN EMPTY STOMACH BEFORE BREAKFAST. REMAIN UPRIGHT FOR 30 MINUTES & TAKE WITH 8 OUNCES OF WATER 12 tablet 0   Calcium Carbonate (CALCIUM 600 PO) Take by mouth 2 (two) times daily.     EPINEPHrine 0.3 mg/0.3 mL IJ SOAJ injection Inject 0.3 mLs (0.3 mg total) into the muscle as needed for anaphylaxis. 2 each 1   Misc Natural Products (JOINT HEALTH PO) daily.     pravastatin (PRAVACHOL) 20 MG tablet TAKE ONE TABLET BY MOUTH DAILY 90 tablet 1   Cholecalciferol (VITAMIN D3) 10 MCG (400 UNIT) CAPS Take 400 Units by mouth daily.     Current Facility-Administered Medications on File Prior to Visit  Medication Dose Route Frequency Provider Last Rate Last Admin   omalizumab Arvid Right) injection 300 mg  300 mg Subcutaneous Q28 days Bobbitt, [name],  MD   300 mg at 03/26/20 0918    BP 114/70 (BP Location: Right Arm, Patient Position: Sitting, Cuff Size: Small)    Pulse 82    Temp 98.5 F (36.9 C) (Oral)    Resp 16    Ht 5\' 2"  (1.575 m)    Wt 124 lb 12.8 oz (56.6 kg)    LMP 07/25/1998    SpO2 100%    BMI 22.83 kg/m       Objective:   Physical Exam Physical Exam  Constitutional: She is oriented to person, place, and time. She appears well-developed and well-nourished. No distress.  HENT:  Head: Normocephalic and atraumatic.  Right Ear: Tympanic membrane and ear canal normal.  Left Ear: Tympanic membrane and ear canal normal.  Mouth/Throat: Not examined- pt wearing mask Eyes: Pupils are equal, round, and reactive to light. No scleral icterus.  Neck: Normal range of motion. No thyromegaly present.  Cardiovascular: Normal rate and regular rhythm.   No murmur heard. Pulmonary/Chest: Effort normal and breath sounds normal. No respiratory distress. He has no wheezes. She has no rales. She exhibits no tenderness.  Abdominal: Soft. Bowel sounds are normal. She exhibits no distension and no mass. There is no tenderness. There is no rebound and no guarding.  Musculoskeletal: She exhibits no edema.  Lymphadenopathy:    She has no cervical adenopathy.  Neurological: She is alert and oriented to person, place, and time. She has normal patellar reflexes. She exhibits normal muscle tone. Coordination normal.  Skin: Skin is warm and dry.  Psychiatric: She has a normal mood and affect. Her behavior is normal. Judgment and thought content normal.  Breasts: Examined lying Right: Without masses, retractions, discharge or axillary adenopathy.  Left: Without masses, retractions, discharge or axillary adenopathy.  Breast/pelvic: deferred         Assessment & Plan:  Preventative care-  Immunizations reviewed and up to date.  Colo and dexa up to date. Mammo is scheduled.  I commended her on her healthy diet, exercise and weight loss. I advised her  that her right groin pain is likely some OA in the right hip. She will let me know if this worsens or if it does not improve.   This visit occurred during the SARS-CoV-2 public health emergency.  Safety protocols were in place, including screening questions prior to the visit, additional usage of staff PPE, and extensive cleaning of exam room while observing appropriate contact time as indicated for disinfecting solutions.         Assessment & Plan:

## 2020-07-08 NOTE — Patient Instructions (Signed)
Please complete lab work prior to leaving.  Great work on [employer] and regular exercise.

## 2020-07-14 ENCOUNTER — Encounter: Payer: Medicare HMO | Admitting: Family

## 2020-07-15 ENCOUNTER — Encounter: Payer: Medicare HMO | Admitting: Family

## 2020-08-07 ENCOUNTER — Other Ambulatory Visit: Payer: Self-pay | Admitting: Family

## 2020-08-19 ENCOUNTER — Ambulatory Visit (INDEPENDENT_AMBULATORY_CARE_PROVIDER_SITE_OTHER): Payer: Medicare HMO

## 2020-08-19 ENCOUNTER — Other Ambulatory Visit: Payer: Self-pay

## 2020-08-19 DIAGNOSIS — Z1231 Encounter for screening mammogram for malignant neoplasm of breast: Secondary | ICD-10-CM

## 2020-09-17 ENCOUNTER — Ambulatory Visit: Payer: Medicare HMO | Admitting: Allergy & Immunology

## 2020-09-17 ENCOUNTER — Ambulatory Visit: Payer: Medicare HMO | Admitting: Allergy and Immunology

## 2020-10-08 ENCOUNTER — Other Ambulatory Visit: Payer: Self-pay | Admitting: Family

## 2020-10-08 DIAGNOSIS — E7800 Pure hypercholesterolemia, unspecified: Secondary | ICD-10-CM

## 2020-11-15 ENCOUNTER — Other Ambulatory Visit: Payer: Self-pay | Admitting: Family

## 2020-12-14 ENCOUNTER — Encounter: Payer: Self-pay | Admitting: Family

## 2020-12-14 ENCOUNTER — Ambulatory Visit (INDEPENDENT_AMBULATORY_CARE_PROVIDER_SITE_OTHER): Payer: Medicare HMO | Admitting: Family

## 2020-12-14 ENCOUNTER — Ambulatory Visit (HOSPITAL_BASED_OUTPATIENT_CLINIC_OR_DEPARTMENT_OTHER)
Admission: RE | Admit: 2020-12-14 | Discharge: 2020-12-14 | Disposition: A | Discharge status: Home / Self Care | Payer: Medicare HMO | Source: Ambulatory Visit | Attending: Family | Admitting: Family

## 2020-12-14 ENCOUNTER — Other Ambulatory Visit: Payer: Self-pay

## 2020-12-14 VITALS — BP 131/76 | HR 74 | Temp 99.1°F | Resp 16 | Ht 62.0 in | Wt 128.0 lb

## 2020-12-14 DIAGNOSIS — R102 Pelvic and perineal pain: Secondary | ICD-10-CM

## 2020-12-14 DIAGNOSIS — M1611 Unilateral primary osteoarthritis, right hip: Secondary | ICD-10-CM

## 2020-12-14 DIAGNOSIS — Q7649 Other congenital malformations of spine, not associated with scoliosis: Secondary | ICD-10-CM | POA: Diagnosis not present

## 2020-12-14 MED ORDER — MELOXICAM 7.5 MG PO TABS: 7.5000 mg | ORAL_TABLET | Freq: Every day | ORAL | 0 refills | Status: DC

## 2020-12-14 NOTE — Patient Instructions (Signed)
Begin meloxicam 1 tab by mouth once daily as needed for pain. Complete x-ray on the first floor.

## 2020-12-14 NOTE — Assessment & Plan Note (Signed)
New. Will obtain x-ray of pelvis/right hip to further evaluation. Rx meloxicam 7.5mg  once daily as needed.

## 2020-12-14 NOTE — Progress Notes (Signed)
Subjective:   By signing my name below, I, Shehryar Baig, attest that this documentation has been prepared under the direction and in the presence of Debbrah Alar NP. 12/14/2020      Patient ID: Jocelyn Morgan, female    DOB: 1950/08/02, 70 y.o.   MRN: [id number]  No chief complaint on file.   HPI Patient is in today for a office visit. She complains of right hip and right side groin pain that shoots down her leg since last December. She reports that it has worsened since last time. She's limited walking in her daily life due to the pain. Her pain does not wrap around her hips and she denies pain in her left hip. She takes tylenol to manage the pain and finds mild relief. She denies any recent fall/injury.    Past Medical History:  Diagnosis Date  . Arthritis    osteoarthritis  . Hyperlipidemia   . Liver hemangioma 03/16/2011  . Postmenopausal HRT (hormone replacement therapy)     Past Surgical History:  Procedure Laterality Date  . TUBAL LIGATION      Family History  Problem Relation Age of Onset  . Ulcerative colitis Brother   . Cancer Mother 46       breast  . Allergic rhinitis Mother   . Urticaria Mother   . Arthritis Other   . Diabetes Other   . Cancer Other        bladder  . Cancer Cousin 65       Stage 3 breast cancer  . COPD Father        lived to be 25  . Bladder Cancer Father     Social History   Socioeconomic History  . Marital status: Married    Spouse name: Not on file  . Number of children: 4  . Years of education: Not on file  . Highest education level: Not on file  Occupational History  . Occupation: housewife  Tobacco Use  . Smoking status: Never Smoker  . Smokeless tobacco: Never Used  Vaping Use  . Vaping Use: Never used  Substance and Sexual Activity  . Alcohol use: Yes    Alcohol/week: 1.0 - 2.0 standard drink    Types: 1 - 2 Glasses of wine per week  . Drug use: No  . Sexual activity: Yes  Other Topics Concern  . Not  on file  Social History Narrative   Exercise: sometimes   Caffeine: 2 cups 1/2 and 1/2 coffee daily   6 grandchildren (none of her children are in [place])   Married         Social Determinants of Radio broadcast assistant Strain: Not on file  Food Insecurity: Not on file  Transportation Needs: Not on file  Physical Activity: Not on file  Stress: Not on file  Social Connections: Not on file  Intimate Partner Violence: Not on file    Outpatient Medications Prior to Visit  Medication Sig Dispense Refill  . alendronate (FOSAMAX) 70 MG tablet TAKE ONE TABLET BY MOUTH ONCE WEEKLY ON AN EMPTY STOMACH BEFORE BREAKFAST, REMAIN UPRIGHT FOR 30 MINUTES AND TAKE WITH 8 OUNCES OF WATER 12 tablet 3  . Calcium Carbonate (CALCIUM 600 PO) Take by mouth 2 (two) times daily.    . Cholecalciferol (VITAMIN D3) 10 MCG (400 UNIT) CAPS Take 400 Units by mouth daily.    Marland Kitchen EPINEPHrine 0.3 mg/0.3 mL IJ SOAJ injection Inject 0.3 mLs (0.3 mg total) into the  muscle as needed for anaphylaxis. 2 each 1  . Misc Natural Products (JOINT HEALTH PO) daily.    . pravastatin (PRAVACHOL) 20 MG tablet TAKE ONE TABLET BY MOUTH DAILY 90 tablet 1   Facility-Administered Medications Prior to Visit  Medication Dose Route Frequency Provider Last Rate Last Admin  . omalizumab Arvid Right) injection 300 mg  300 mg Subcutaneous Q28 days Bobbitt, [name], MD   300 mg at 03/26/20 9417    No Known Allergies  Review of Systems  Musculoskeletal: Positive for joint pain (Right hip pain) and myalgias (Right side groin).       Objective:    Physical Exam Constitutional:      Appearance: Normal appearance.  HENT:     Head: Normocephalic and atraumatic.     Right Ear: External ear normal.     Left Ear: External ear normal.  Eyes:     Extraocular Movements: Extraocular movements intact.     Pupils: Pupils are equal, round, and reactive to light.  Cardiovascular:     Rate and Rhythm: Normal rate and regular rhythm.      Pulses: Normal pulses.     Heart sounds: Normal heart sounds. No murmur heard. No gallop.   Pulmonary:     Effort: Pulmonary effort is normal. No respiratory distress.     Breath sounds: Normal breath sounds. No wheezing, rhonchi or rales.  Musculoskeletal:     Comments: Right pelvic pain with adduction.  Skin:    General: Skin is warm and dry.  Neurological:     Mental Status: She is alert and oriented to person, place, and time.  Psychiatric:        Behavior: Behavior normal.     LMP 07/25/1998  Wt Readings from Last 3 Encounters:  07/08/20 124 lb 12.8 oz (56.6 kg)  03/17/20 127 lb 10.3 oz (57.9 kg)  09/19/19 134 lb 3.2 oz (60.9 kg)    Diabetic Foot Exam - Simple   No data filed    Lab Results  Component Value Date   WBC 4.8 06/25/2019   HGB 14.0 06/25/2019   HCT 41.6 06/25/2019   PLT 346.0 06/25/2019   GLUCOSE 95 07/08/2020   CHOL 186 07/08/2020   TRIG 119.0 07/08/2020   HDL 48.10 07/08/2020   LDLDIRECT 109.0 06/25/2019   LDLCALC 114 (H) 07/08/2020   ALT 13 07/08/2020   AST 15 07/08/2020   NA 139 07/08/2020   K 4.1 07/08/2020   CL 103 07/08/2020   CREATININE 0.64 07/08/2020   BUN 13 07/08/2020   CO2 30 07/08/2020   TSH 3.66 05/10/2017    Lab Results  Component Value Date   TSH 3.66 05/10/2017   Lab Results  Component Value Date   WBC 4.8 06/25/2019   HGB 14.0 06/25/2019   HCT 41.6 06/25/2019   MCV 92.1 06/25/2019   PLT 346.0 06/25/2019   Lab Results  Component Value Date   NA 139 07/08/2020   K 4.1 07/08/2020   CO2 30 07/08/2020   GLUCOSE 95 07/08/2020   BUN 13 07/08/2020   CREATININE 0.64 07/08/2020   BILITOT 0.6 07/08/2020   ALKPHOS 37 (L) 07/08/2020   AST 15 07/08/2020   ALT 13 07/08/2020   PROT 7.1 07/08/2020   ALBUMIN 4.8 07/08/2020   CALCIUM 9.5 07/08/2020   GFR 89.48 07/08/2020   Lab Results  Component Value Date   CHOL 186 07/08/2020   Lab Results  Component Value Date   HDL 48.10 07/08/2020  Lab Results  Component  Value Date   LDLCALC 114 (H) 07/08/2020   Lab Results  Component Value Date   TRIG 119.0 07/08/2020   Lab Results  Component Value Date   CHOLHDL 4 07/08/2020   No results found for: HGBA1C     Assessment & Plan:   Problem List Items Addressed This Visit   None      No orders of the defined types were placed in this encounter.   I, Debbrah Alar NP, personally preformed the services described in this documentation.  All medical record entries made by the scribe were at my direction and in my presence.  I have reviewed the chart and discharge instructions (if applicable) and agree that the record reflects my personal performance and is accurate and complete. 12/14/2020   I,Shehryar Baig,acting as a Education administrator for Nance Pear, NP.,have documented all relevant documentation on the behalf of Nance Pear, NP,as directed by  Nance Pear, NP while in the presence of Nance Pear, NP.   Shehryar [employer]

## 2020-12-16 NOTE — Addendum Note (Signed)
Addended by: Debbrah Alar on: 12/16/2020 05:26 PM   Modules accepted: Orders

## 2020-12-24 ENCOUNTER — Emergency Department
Admission: EM | Admit: 2020-12-24 | Discharge: 2020-12-24 | Disposition: A | Discharge status: Home / Self Care | Payer: Medicare HMO | Source: Home / Self Care

## 2020-12-24 ENCOUNTER — Other Ambulatory Visit: Payer: Self-pay

## 2020-12-24 ENCOUNTER — Encounter: Payer: Self-pay | Admitting: Family

## 2020-12-24 ENCOUNTER — Emergency Department: Admit: 2020-12-24 | Discharge status: Absent without leave | Payer: Self-pay

## 2020-12-24 DIAGNOSIS — S39012A Strain of muscle, fascia and tendon of lower back, initial encounter: Secondary | ICD-10-CM | POA: Diagnosis not present

## 2020-12-24 DIAGNOSIS — M62830 Muscle spasm of back: Secondary | ICD-10-CM | POA: Diagnosis not present

## 2020-12-24 MED ORDER — BACLOFEN 10 MG PO TABS: 10.0000 mg | ORAL_TABLET | Freq: Three times a day (TID) | ORAL | 0 refills | Status: DC

## 2020-12-24 NOTE — ED Provider Notes (Addendum)
[name]    CSN: [id number] Arrival date & time: 12/24/20  4098      History   Chief Complaint Chief Complaint  Patient presents with   Back Pain    Lower    HPI Jocelyn Morgan is a 70 y.o. female.   HPI 70 year old female presents with lower back pain for 2 days.  Reports evaluated by PCP 2 weeks ago for osteoarthritis of hips.  Patient reports bumped her right/left foot on piece of furniture yesterday which pain shot up her leg and hip now says her back feels like it is out of alignment.  Currently on meloxicam 7.5 mg daily. Right hip x-ray (2-3V) formed on 12/16/2020 revealed progressive moderate to severe right hip degenerative arthritis.  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  Patient reports she is stopped taking meloxicam 7.5 mg after 1-2 doses as it is ineffective and she has more pain relief from Tylenol (1000 mg).  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   Rhinitis 09/18/2018   Allergic reaction 09/18/2018   Angioedema 09/18/2018   Hip pain, bilateral 08/19/2014   Benign paroxysmal positional vertigo 04/23/2014   Atrophic vaginitis 04/23/2014   Neck pain 10/18/2013   Diverticulitis 10/18/2013   Abdominal pain, left lower quadrant 10/11/2013   Mass of mouth 10/15/2012   Atypical chest pain 12/26/2011   Liver hemangioma 03/16/2011   Osteopenia 11/17/2010   General medical examination 10/25/2010   Carpal tunnel syndrome of right wrist 10/25/2010   Low back pain, episodic 10/25/2010   VAGINITIS, ATROPHIC, POSTMENOPAUSAL 05/05/2009   OSTEOARTHRITIS 06/30/2008   HYPERCHOLESTEROLEMIA 04/03/2008    DECREASED HEARING, BILATERAL 04/03/2008    Past Surgical History:  Procedure Laterality Date   TUBAL LIGATION      OB History   No obstetric history on file.      Home Medications    Prior to Admission medications   Medication Sig Start Date End Date Taking? Authorizing Provider  baclofen (LIORESAL) 10 MG tablet Take 1 tablet (10 mg total) by mouth 3 (three) times daily. 12/24/20  Yes Eliezer Lofts, FNP  levocetirizine Harlow Ohms) 2.5 MG/5ML solution  12/20/20  Yes [provider]  alendronate (FOSAMAX) 70 MG tablet TAKE ONE TABLET BY MOUTH ONCE WEEKLY ON AN EMPTY STOMACH BEFORE BREAKFAST, REMAIN UPRIGHT FOR 30 MINUTES AND TAKE WITH 8 OUNCES OF WATER 11/16/20   Debbrah Alar, NP  Calcium Carbonate (CALCIUM 600 PO) Take by mouth 2 (two) times daily.    [provider]  EPINEPHrine 0.3 mg/0.3 mL IJ SOAJ injection Inject 0.3 mLs (0.3 mg total) into the muscle as needed for anaphylaxis. 09/19/19   Bobbitt, [name], MD  meloxicam (MOBIC) 7.5 MG tablet Take 1 tablet (7.5 mg total) by mouth daily. 12/14/20   Debbrah Alar, NP  Misc Natural Products (JOINT HEALTH PO) daily.    [provider]  pravastatin (PRAVACHOL) 20 MG tablet TAKE ONE TABLET BY MOUTH DAILY 10/08/20   Debbrah Alar, NP    Family History Family History  Problem Relation Age of Onset   Ulcerative colitis Brother    Cancer Mother 75  breast   Allergic rhinitis Mother    Urticaria Mother    Arthritis Other    Diabetes Other    Cancer Other        bladder   Cancer Cousin 24       Stage 3 breast cancer   COPD Father        lived to be 63   Bladder Cancer Father     Social History Social History   Tobacco Use   Smoking status: Never Smoker   Smokeless tobacco: Never Used  Media planner   Vaping Use: Never used  Substance Use Topics   Alcohol use: Yes    Alcohol/week: 1.0 - 2.0 standard drink    Types: 1 - 2 Glasses of wine per week   Drug use: No      Allergies   Patient has no known allergies.   Review of Systems Review of Systems  Constitutional: Negative.   HENT: Negative.   Eyes: Negative.   Respiratory: Negative.   Cardiovascular: Negative.   Gastrointestinal: Negative.   Genitourinary: Negative.   Musculoskeletal: Positive for back pain.  Skin: Negative.   Neurological: Negative.      Physical Exam Triage Vital Signs ED Triage Vitals [12/24/20 1013]  Enc Vitals Group     BP (!) 145/86     Pulse Rate 86     Resp 18     Temp 98.5 F (36.9 C)     Temp Source Oral     SpO2 98 %     Weight      Height      Head Circumference      Peak Flow      Pain Score 5     Pain Loc      Pain Edu?      Excl. in [place]?    No data found.  Updated Vital Signs BP (!) 145/86 (BP Location: Right Arm)   Pulse 86   Temp 98.5 F (36.9 C) (Oral)   Resp 18   LMP 07/25/1998   SpO2 98%     Physical Exam Vitals and nursing note reviewed.  Constitutional:      Appearance: Normal appearance. She is normal weight. She is not ill-appearing.  HENT:     Head: Normocephalic and atraumatic.     Mouth/Throat:     Mouth: Mucous membranes are moist.     Pharynx: Oropharynx is clear.  Eyes:     Extraocular Movements: Extraocular movements intact.     Conjunctiva/sclera: Conjunctivae normal.     Pupils: Pupils are equal, round, and reactive to light.  Cardiovascular:     Rate and Rhythm: Normal rate and regular rhythm.     Pulses: Normal pulses.     Heart sounds: Normal heart sounds. No murmur heard.   Pulmonary:     Effort: Pulmonary effort is normal.     Breath sounds: Normal breath sounds. No rhonchi or rales.  Musculoskeletal:     Cervical back: Normal range of motion and neck supple.     Comments: Lumbar sacral spine: Notable left curvature scoliosis, TTP over paraspinous muscles and spinal erectors bilaterally, notable muscle adhesions present  Skin:    General: Skin is warm and dry.  Neurological:     General:  No focal deficit present.     Mental Status: She is alert and oriented to person, place, and time.  Psychiatric:        Mood and Affect: Mood normal.  UC Treatments / Results  Labs (all labs ordered are listed, but only abnormal results are displayed) Labs Reviewed - No data to display  EKG   Radiology No results found.  Procedures Procedures (including critical care time)  Medications Ordered in UC Medications - No data to display  Initial Impression / Assessment and Plan / UC Course  I have reviewed the triage vital signs and the nursing notes.  Pertinent labs & imaging results that were available during my care of the patient were reviewed by me and considered in my medical decision making (see chart for details).     MDM: 1.  Lumbar strain, 2.  Muscle spasms of back.  Patient discharged home, hemodynamically stable. Final Clinical Impressions(s) / UC Diagnoses   Final diagnoses:  Strain of lumbar region, initial encounter  Muscle spasm of back     Discharge Instructions      Advised/instructed patient may take baclofen daily, as needed for lower back muscle spasms.  Advised/encouraged patient to follow-up with PCP for x-rays of thoracic and lumbar spine if pain persist and or unresolved.    ED Prescriptions     Medication Sig Dispense Auth. Provider   baclofen (LIORESAL) 10 MG tablet Take 1 tablet (10 mg total) by mouth 3 (three) times daily. 41 each Eliezer Lofts, FNP      PDMP not reviewed this encounter.   Eliezer Lofts, [place] 12/24/20 1132    Eliezer Lofts, [place] 03/01/21 [phone]

## 2020-12-24 NOTE — Discharge Instructions (Addendum)
Advised/instructed patient may take baclofen daily, as needed for lower back muscle spasms.  Advised/encouraged patient to follow-up with PCP for x-rays of thoracic and lumbar spine if pain persist and or unresolved.

## 2020-12-24 NOTE — ED Triage Notes (Signed)
Pt c/o lower back pain x 2 days. Saw PCP two weeks ago for hip pain. Dx arthritis. Says she bumped her foot on a piece of furniture yesterday which shot pain up her leg and hip and now says her back feels like its out of alignment. Pain 5/10 Meloxicam daily for arthritic pain.

## 2020-12-25 NOTE — Telephone Encounter (Signed)
Please see pt my chart message. Thanks.

## 2020-12-28 ENCOUNTER — Other Ambulatory Visit: Payer: Self-pay

## 2020-12-28 ENCOUNTER — Encounter: Payer: Self-pay | Admitting: Orthopaedic Surgery

## 2020-12-28 ENCOUNTER — Ambulatory Visit: Payer: Medicare HMO | Admitting: Orthopaedic Surgery

## 2020-12-28 VITALS — Ht 62.0 in | Wt 128.0 lb

## 2020-12-28 DIAGNOSIS — M1611 Unilateral primary osteoarthritis, right hip: Secondary | ICD-10-CM | POA: Diagnosis not present

## 2020-12-28 DIAGNOSIS — M25551 Pain in right hip: Secondary | ICD-10-CM | POA: Diagnosis not present

## 2020-12-28 NOTE — Progress Notes (Signed)
Office Visit Note   Patient: Jocelyn Morgan           Date of Birth: 1951/07/20           MRN: [id number] Visit Date: 12/28/2020              Requested by: Debbrah Alar, NP [address] [place],  [place] [address] PCP: Debbrah Alar, NP   Assessment & Plan: Visit Diagnoses:  1. Unilateral primary osteoarthritis, right hip   2. Pain in right hip     Plan: I did talk to the patient in length about hip replacement surgery for her right hip.  I went over her x-ray findings and her clinical exam findings.  I showed her hip model and gave her handout about hip replacement surgery.  We discussed the intraoperative and postoperative course for hip replacement surgery as well as described the risk and benefits of this type of surgery.  Her and her husband have a lot of trips planned for the summer so they will be in touch with [place] about scheduling the surgery.  I gave her our surgery scheduler's card.  All questions and concerns were answered and addressed.  Follow-Up Instructions: Return for 2 weeks post-op.   Orders:  No orders of the defined types were placed in this encounter.  No orders of the defined types were placed in this encounter.     Procedures: No procedures performed   Clinical Data: No additional findings.   Subjective: Chief Complaint  Patient presents with  . Right Hip - Pain  The patient is a very pleasant and active young appearing 70 year old female who has been having right hip pain for over over 6 months to almost a year now.  It hurts in the right groin area and her low back.  And posterior hip area.  She does have known scoliosis lumbar spine.  However the hip pain is in the right groin and it is causing the detrimental effect on her mobility, her quality of life and actives daily living.  It is harder to even cross her leg on that right side and to put her shoes and socks on the right side.  The left side has no issues.  She is  not a diabetic.  She has been alternating Tylenol and meloxicam.  She does take baclofen for her spine.  She walks without assist device.  She is a thin individual.  Her husband is with her today as well.  At this point she does wish to consider hip replacement surgery for her right hip.  HPI  Review of Systems There is currently listed no headache, chest pain, shortness of breath, fever, chills, nausea, vomiting  Objective: Vital Signs: Ht 5\' 2"  (1.575 m)   Wt 128 lb (58.1 kg)   LMP 07/25/1998   BMI 23.41 kg/m   Physical Exam She is alert and orient x3 and in no acute distress Ortho Exam Examination of her left hip is entirely normal.  Examination right hip is very stiff with in attempts of rotation.  There is limited internal and external rotation and this does cause severe pain in the groin with rotation. Specialty Comments:  No specialty comments available.  Imaging: No results found. X-rays independently reviewed of the right hip does show severe arthritis of the right hip.  There is joint space narrowing and irregular surface of the femoral head.  There are para-articular osteophytes as well as sclerotic changes in  both the femoral head and acetabulum.  PMFS History: Patient Active Problem List   Diagnosis Date Noted  . Unilateral primary osteoarthritis, right hip 12/28/2020  . Pelvic pain 12/14/2020  . Chronic idiopathic urticaria 09/19/2019  . 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 Rhinitis 09/18/2018  . Allergic reaction 09/18/2018  . Angioedema 09/18/2018  . Hip pain, bilateral 08/19/2014  . Benign paroxysmal positional vertigo 04/23/2014  . Atrophic vaginitis 04/23/2014  . Neck pain 10/18/2013  . Diverticulitis 10/18/2013  . Abdominal pain, left lower quadrant 10/11/2013  . Mass of mouth 10/15/2012  . Atypical chest pain 12/26/2011  . Liver hemangioma 03/16/2011  . Osteopenia 11/17/2010  . General medical examination 10/25/2010  . Carpal tunnel syndrome of right  wrist 10/25/2010  . Low back pain, episodic 10/25/2010  . VAGINITIS, ATROPHIC, POSTMENOPAUSAL 05/05/2009  . OSTEOARTHRITIS 06/30/2008  . HYPERCHOLESTEROLEMIA 04/03/2008  . DECREASED HEARING, BILATERAL 04/03/2008   Past Medical History:  Diagnosis Date  . Arthritis    osteoarthritis  . Hyperlipidemia   . Liver hemangioma 03/16/2011  . Postmenopausal HRT (hormone replacement therapy)     Family History  Problem Relation Age of Onset  . Ulcerative colitis Brother   . Cancer Mother 54       breast  . Allergic rhinitis Mother   . Urticaria Mother   . Arthritis Other   . Diabetes Other   . Cancer Other        bladder  . Cancer Cousin 65       Stage 3 breast cancer  . COPD Father        lived to be 20  . Bladder Cancer Father     Past Surgical History:  Procedure Laterality Date  . TUBAL LIGATION     Social History   Occupational History  . Occupation: housewife  Tobacco Use  . Smoking status: Never Smoker  . Smokeless tobacco: Never Used  Vaping Use  . Vaping Use: Never used  Substance and Sexual Activity  . Alcohol use: Yes    Alcohol/week: 1.0 - 2.0 standard drink    Types: 1 - 2 Glasses of wine per week  . Drug use: No  . Sexual activity: Yes

## 2020-12-30 ENCOUNTER — Ambulatory Visit: Payer: Medicare HMO | Admitting: Family

## 2021-01-04 ENCOUNTER — Telehealth: Payer: Self-pay

## 2021-01-04 NOTE — Telephone Encounter (Signed)
Patient left voice mail to inquire about scheduling surgery.  I called patient back and left voice mail for return call.

## 2021-01-12 ENCOUNTER — Other Ambulatory Visit: Payer: Self-pay | Admitting: Family

## 2021-01-12 DIAGNOSIS — R102 Pelvic and perineal pain: Secondary | ICD-10-CM

## 2021-01-15 ENCOUNTER — Encounter: Payer: Self-pay | Admitting: Internal Medicine

## 2021-01-15 ENCOUNTER — Telehealth (INDEPENDENT_AMBULATORY_CARE_PROVIDER_SITE_OTHER): Payer: Medicare HMO | Admitting: Internal Medicine

## 2021-01-15 VITALS — BP 134/76 | Temp 100.5°F | Ht 62.0 in | Wt 127.0 lb

## 2021-01-15 DIAGNOSIS — U071 COVID-19: Secondary | ICD-10-CM

## 2021-01-15 MED ORDER — MOLNUPIRAVIR EUA 200MG CAPSULE: 4.0000 | ORAL_CAPSULE | Freq: Two times a day (BID) | ORAL | 0 refills | Status: AC

## 2021-01-15 NOTE — Progress Notes (Signed)
Subjective:    Patient ID: Jocelyn Morgan, female    DOB: 1951-02-09, 70 y.o.   MRN: [id number]  DOS:  01/15/2021 Type of visit - description: Virtual Visit via Video Note  I connected with the above patient  by a video enabled telemedicine application and verified that I am speaking with the correct person using two identifiers.   THIS ENCOUNTER IS A VIRTUAL VISIT DUE TO COVID-19 - PATIENT WAS NOT SEEN IN THE OFFICE. PATIENT HAS CONSENTED TO VIRTUAL VISIT / TELEMEDICINE VISIT   Location of patient: home  Location of provider: office  Persons participating in the virtual visit: patient, provider   I discussed the limitations of evaluation and management by telemedicine and the availability of in person appointments. The patient expressed understanding and agreed to proceed.  Acute Her husband was diagnosed with COVID earlier this week. The patient started with symptoms 2 days ago: Low-grade temp on and off ~ 100.5, sore throat, earache. Some generalized aches and pains. Today she tested positive for COVID.  Has checked her oxygen once: 97% No nausea, vomiting, diarrhea No chest pain no difficulty breathing Some cough.    Review of Systems See above   Past Medical History:  Diagnosis Date   Arthritis    osteoarthritis   Hyperlipidemia    Liver hemangioma 03/16/2011   Postmenopausal HRT (hormone replacement therapy)     Past Surgical History:  Procedure Laterality Date   TUBAL LIGATION      Allergies as of 01/15/2021   No Known Allergies      Medication List        Accurate as of January 15, 2021  3:56 PM. If you have any questions, ask your nurse or doctor.          alendronate 70 MG tablet Commonly known as: FOSAMAX TAKE ONE TABLET BY MOUTH ONCE WEEKLY ON AN EMPTY STOMACH BEFORE BREAKFAST, REMAIN UPRIGHT FOR 30 MINUTES AND TAKE WITH 8 OUNCES OF WATER   baclofen 10 MG tablet Commonly known as: LIORESAL Take 1 tablet (10 mg total) by mouth 3 (three)  times daily.   CALCIUM 600 PO Take by mouth 2 (two) times daily.   EPINEPHrine 0.3 mg/0.3 mL Soaj injection Commonly known as: EPI-PEN Inject 0.3 mLs (0.3 mg total) into the muscle as needed for anaphylaxis.   JOINT HEALTH PO daily.   levocetirizine 2.5 MG/5ML solution Commonly known as: XYZAL   meloxicam 7.5 MG tablet Commonly known as: MOBIC TAKE ONE TABLET BY MOUTH DAILY   pravastatin 20 MG tablet Commonly known as: PRAVACHOL TAKE ONE TABLET BY MOUTH DAILY           Objective:   Physical Exam BP 134/76 (BP Location: Right Arm)   Temp (!) 100.5 F (38.1 C) (Oral)   Ht 5\' 2"  (1.575 m)   Wt 127 lb (57.6 kg)   LMP 07/25/1998   BMI 23.23 kg/m  This is a video visit, she looks well, alert oriented x3, in no distress, no cough noted    Assessment     70 year old female, PMH includes osteoporosis, high cholesterol, DJD, status post   COVID vaccines x 4, presents with  COVID-19: Symptoms a started 2 days ago, she is fully vaccinated, does not appear toxic. Explained to patient that her chances to get sicker decreased with medication. No recent kidney function. Plan: Molnupiravir for 5 days. Rest, fluids, Tylenol, call if not gradually better, ER if symptoms severe. She verbalized understanding  I discussed the assessment and treatment plan with the patient. The patient was provided an opportunity to ask questions and all were answered. The patient agreed with the plan and demonstrated an understanding of the instructions.   The patient was advised to call back or seek an in-person evaluation if the symptoms worsen or if the condition fails to improve as anticipated.

## 2021-01-19 ENCOUNTER — Other Ambulatory Visit: Payer: Self-pay

## 2021-02-22 NOTE — Progress Notes (Signed)
Sent message, via epic in basket, requesting orders in epic from surgeon.  

## 2021-02-24 NOTE — Progress Notes (Signed)
Need orders in epicl  Preop on 8/5 surgery on 03/05/21.

## 2021-02-26 ENCOUNTER — Encounter (HOSPITAL_COMMUNITY): Payer: Self-pay

## 2021-02-26 ENCOUNTER — Encounter: Payer: Self-pay | Admitting: Family

## 2021-02-26 ENCOUNTER — Encounter (HOSPITAL_COMMUNITY)
Admission: RE | Admit: 2021-02-26 | Discharge: 2021-02-26 | Disposition: A | Discharge status: Home / Self Care | Payer: Medicare HMO | Source: Ambulatory Visit | Attending: Orthopaedic Surgery | Admitting: Orthopaedic Surgery

## 2021-02-26 ENCOUNTER — Other Ambulatory Visit: Payer: Self-pay | Admitting: Physician Assistant

## 2021-02-26 ENCOUNTER — Other Ambulatory Visit: Payer: Self-pay

## 2021-02-26 DIAGNOSIS — Z01812 Encounter for preprocedural laboratory examination: Secondary | ICD-10-CM | POA: Insufficient documentation

## 2021-02-26 LAB — CBC
HCT: 41.4 % (ref 36.0–46.0)
Hemoglobin: 13.4 g/dL (ref 12.0–15.0)
MCH: 30.9 pg (ref 26.0–34.0)
MCHC: 32.4 g/dL (ref 30.0–36.0)
MCV: 95.6 fL (ref 80.0–100.0)
Platelets: 356 10*3/uL (ref 150–400)
RBC: 4.33 MIL/uL (ref 3.87–5.11)
RDW: 13.1 % (ref 11.5–15.5)
WBC: 7.3 10*3/uL (ref 4.0–10.5)
nRBC: 0 % (ref 0.0–0.2)

## 2021-02-26 LAB — SURGICAL PCR SCREEN
MRSA, PCR: NEGATIVE
Staphylococcus aureus: NEGATIVE

## 2021-02-26 NOTE — Progress Notes (Addendum)
Anesthesia Review:  PCP: DR Debbrah Alar LOV 12/14/20-epic Cardiologist : none  Chest x-ray : EKG : Echo : Stress test: Cardiac Cath :  Activity level: can do a fight of stairs without difficulty  Sleep Study/ CPAP : none  Fasting Blood Sugar :      / Checks Blood Sugar -- times a day:   Blood Thinner/ Instructions /Last Dose: ASA / Instructions/ Last Dose :   Tested positive on home test for covid on 01/14/21.  Video visit with DR Kathlene November on 01/15/21.  REsults in epic and on front of chart.  No orders in at preop appt.  ORders requested on 8/3 and 02/25/21.

## 2021-02-26 NOTE — Progress Notes (Signed)
DUE TO COVID-19 ONLY ONE VISITOR IS ALLOWED TO COME WITH YOU AND STAY IN THE WAITING ROOM ONLY DURING PRE OP AND PROCEDURE DAY OF SURGERY. THE 1 VISITOR  MAY VISIT WITH YOU AFTER SURGERY IN YOUR PRIVATE ROOM DURING VISITING HOURS ONLY!  YOU NEED TO HAVE A COVID 19 TEST ON__8/03/2021 _____ '@_______'$ , THIS TEST MUST BE DONE BEFORE SURGERY,  COVID TESTING SITE [address] [place] [address], IT IS ON THE RIGHT GOING OUT [address] APPROXIMATELY  2 MINUTES PAST [employer] ON THE RIGHT. ONCE YOUR COVID TEST IS COMPLETED,  PLEASE BEGIN THE QUARANTINE INSTRUCTIONS AS OUTLINED IN YOUR HANDOUT.                Jocelyn Morgan  02/26/2021   Your procedure is scheduled on:      03/05/2021   Report to [facility] Main  Entrance   Report to admitting at   0830    AM     Call this number if you have problems the morning of surgery [phone]    Remember: Do not eat food , candy gum or mints :After Midnight. You may have clear liquids from midnight until    0800   CLEAR LIQUID DIET   Foods Allowed                                                                       Coffee and tea, regular and decaf                              Plain Jell-O any favor except red or purple                                            Fruit ices (not with fruit pulp)                                      Iced Popsicles                                     Carbonated beverages, regular and diet                                    Cranberry, grape and apple juices Sports drinks like Gatorade Lightly seasoned clear broth or consume(fat free) Sugar, honey syrup   _____________________________________________________________________    BRUSH YOUR TEETH MORNING OF SURGERY AND RINSE YOUR MOUTH OUT, NO CHEWING GUM CANDY OR MINTS.     Take these medicines the morning of surgery with A SIP OF WATER:  none   DO NOT TAKE ANY DIABETIC MEDICATIONS DAY OF YOUR SURGERY  You may not have any metal on your body including hair pins and              piercings  Do not wear jewelry, make-up, lotions, powders or perfumes, deodorant             Do not wear nail polish on your fingernails.  Do not shave  48 hours prior to surgery.              Men may shave face and neck.   Do not bring valuables to the hospital. [place].  Contacts, dentures or bridgework may not be worn into surgery.  Leave suitcase in the car. After surgery it may be brought to your room.     Patients discharged the day of surgery will not be allowed to drive home. IF YOU ARE HAVING SURGERY AND GOING HOME THE SAME DAY, YOU MUST HAVE AN ADULT TO DRIVE YOU HOME AND BE WITH YOU FOR 24 HOURS. YOU MAY GO HOME BY TAXI OR UBER OR ORTHERWISE, BUT AN ADULT MUST ACCOMPANY YOU HOME AND STAY WITH YOU FOR 24 HOURS.  Name and phone number of your driver:  Special Instructions: N/A              Please read over the following fact sheets you were given: _____________________________________________________________________  [facility] - Preparing for Surgery Before surgery, you can play an important role.  Because skin is not sterile, your skin needs to be as free of germs as possible.  You can reduce the number of germs on your skin by washing with CHG (chlorahexidine gluconate) soap before surgery.  CHG is an antiseptic cleaner which kills germs and bonds with the skin to continue killing germs even after washing. Please DO NOT use if you have an allergy to CHG or antibacterial soaps.  If your skin becomes reddened/irritated stop using the CHG and inform your nurse when you arrive at Short Stay. Do not shave (including legs and underarms) for at least 48 hours prior to the first CHG shower.  You may shave your face/neck. Please follow these instructions carefully:  1.  Shower with CHG Soap the night before surgery and the  morning of Surgery.  2.  If you  choose to wash your hair, wash your hair first as usual with your  normal  shampoo.  3.  After you shampoo, rinse your hair and body thoroughly to remove the  shampoo.                           4.  Use CHG as you would any other liquid soap.  You can apply chg directly  to the skin and wash                       Gently with a scrungie or clean washcloth.  5.  Apply the CHG Soap to your body ONLY FROM THE NECK DOWN.   Do not use on face/ open                           Wound or open sores. Avoid contact with eyes, ears mouth and genitals (private parts).  Wash face,  Genitals (private parts) with your normal soap.             6.  Wash thoroughly, paying special attention to the area where your surgery  will be performed.  7.  Thoroughly rinse your body with warm water from the neck down.  8.  DO NOT shower/wash with your normal soap after using and rinsing off  the CHG Soap.                9.  Pat yourself dry with a clean towel.            10.  Wear clean pajamas.            11.  Place clean sheets on your bed the night of your first shower and do not  sleep with pets. Day of Surgery : Do not apply any lotions/deodorants the morning of surgery.  Please wear clean clothes to the hospital/surgery center.  FAILURE TO FOLLOW THESE INSTRUCTIONS MAY RESULT IN THE CANCELLATION OF YOUR SURGERY PATIENT SIGNATURE_________________________________  NURSE SIGNATURE__________________________________  ________________________________________________________________________

## 2021-03-04 NOTE — H&P (Signed)
TOTAL HIP ADMISSION H&P  Patient is admitted for right total hip arthroplasty.  Subjective:  Chief Complaint: right hip pain  HPI: Jocelyn Morgan, 70 y.o. female, has a history of pain and functional disability in the right hip(s) due to arthritis and patient has failed non-surgical conservative treatments for greater than 12 weeks to include NSAID's and/or analgesics and activity modification.  Onset of symptoms was abrupt starting 1 years ago with rapidlly worsening course since that time.The patient noted no past surgery on the right hip(s).  Patient currently rates pain in the right hip at 10 out of 10 with activity. Patient has night pain, worsening of pain with activity and weight bearing, pain that interfers with activities of daily living, and pain with passive range of motion. Patient has evidence of subchondral cysts, subchondral sclerosis, periarticular osteophytes, and joint space narrowing by imaging studies. This condition presents safety issues increasing the risk of falls.  There is no current active infection.  Patient Active Problem List   Diagnosis Date Noted   Unilateral primary osteoarthritis, right hip 12/28/2020   Pelvic pain 12/14/2020   Chronic idiopathic urticaria 09/19/2019   Allergic urticaria 09/18/2018   Rhinitis 09/18/2018   Allergic reaction 09/18/2018   Angioedema 09/18/2018   Hip pain, bilateral 08/19/2014   Benign paroxysmal positional vertigo 04/23/2014   Atrophic vaginitis 04/23/2014   Neck pain 10/18/2013   Diverticulitis 10/18/2013   Abdominal pain, left lower quadrant 10/11/2013   Mass of mouth 10/15/2012   Atypical chest pain 12/26/2011   Liver hemangioma 03/16/2011   Osteopenia 11/17/2010   General medical examination 10/25/2010   Carpal tunnel syndrome of right wrist 10/25/2010   Low back pain, episodic 10/25/2010   VAGINITIS, ATROPHIC, POSTMENOPAUSAL 05/05/2009   OSTEOARTHRITIS 06/30/2008   HYPERCHOLESTEROLEMIA 04/03/2008   DECREASED  HEARING, BILATERAL 04/03/2008   Past Medical History:  Diagnosis Date   Arthritis    osteoarthritis   Hyperlipidemia    Liver hemangioma 03/16/2011   Postmenopausal HRT (hormone replacement therapy)     Past Surgical History:  Procedure Laterality Date   EYE SURGERY     TUBAL LIGATION      Current Facility-Administered Medications  Medication Dose Route Frequency Provider Last Rate Last Admin   omalizumab Arvid Right) injection 300 mg  300 mg Subcutaneous Q28 days Bobbitt, [name], MD   300 mg at 03/26/20 [id number]   Current Outpatient Medications  Medication Sig Dispense Refill Last Dose   acetaminophen (TYLENOL) 650 MG CR tablet Take 650 mg by mouth every 8 (eight) hours as needed for pain.      alendronate (FOSAMAX) 70 MG tablet TAKE ONE TABLET BY MOUTH ONCE WEEKLY ON AN EMPTY STOMACH BEFORE BREAKFAST, REMAIN UPRIGHT FOR 30 MINUTES AND TAKE WITH 8 OUNCES OF WATER (Patient taking differently: Take 70 mg by mouth every Wednesday.) 12 tablet 3    Calcium Carb-Cholecalciferol (CALCIUM 600 + D PO) Take 1 tablet by mouth in the morning and at bedtime.      cholecalciferol (VITAMIN D3) 25 MCG (1000 UNIT) tablet Take 1,000 Units by mouth daily.      EPINEPHrine 0.3 mg/0.3 mL IJ SOAJ injection Inject 0.3 mLs (0.3 mg total) into the muscle as needed for anaphylaxis. 2 each 1    Melatonin 3 MG CAPS Take 3 mg by mouth at bedtime as needed (sleep).      Misc Natural Products (JOINT HEALTH PO) Take 1 tablet by mouth daily.      Multiple Vitamin (MULTIVITAMIN WITH MINERALS)  TABS tablet Take 1 tablet by mouth daily.      naproxen sodium (ALEVE) 220 MG tablet Take 220 mg by mouth 2 (two) times daily as needed (pain).      pravastatin (PRAVACHOL) 20 MG tablet TAKE ONE TABLET BY MOUTH DAILY (Patient taking differently: Take 20 mg by mouth daily.) 90 tablet 1    baclofen (LIORESAL) 10 MG tablet Take 1 tablet (10 mg total) by mouth 3 (three) times daily. (Patient not taking: No sig reported) 30 each 0 Not  Taking   Allergies  Allergen Reactions   Demerol [Meperidine Hcl] Nausea And Vomiting    In combination with versed caused severe n/v and headaches    Versed [Midazolam] Nausea And Vomiting    In combination with demerol caused severe n/v and headaches     Social History   Tobacco Use   Smoking status: Never   Smokeless tobacco: Never  Substance Use Topics   Alcohol use: Yes    Alcohol/week: 1.0 - 2.0 standard drink    Types: 1 - 2 Glasses of wine per week    Comment: occas    Family History  Problem Relation Age of Onset   Ulcerative colitis Brother    Cancer Mother 31       breast   Allergic rhinitis Mother    Urticaria Mother    Arthritis Other    Diabetes Other    Cancer Other        bladder   Cancer Cousin 64       Stage 3 breast cancer   COPD Father        lived to be 51   Bladder Cancer Father      Review of Systems  Musculoskeletal:  Positive for gait problem.  All other systems reviewed and are negative.  Objective:  Physical Exam Vitals reviewed.  Constitutional:      Appearance: Normal appearance.  HENT:     Head: Normocephalic and atraumatic.  Eyes:     Extraocular Movements: Extraocular movements intact.     Pupils: Pupils are equal, round, and reactive to light.  Cardiovascular:     Rate and Rhythm: Normal rate.     Pulses: Normal pulses.  Pulmonary:     Effort: Pulmonary effort is normal.  Abdominal:     Palpations: Abdomen is soft.  Musculoskeletal:     Cervical back: Normal range of motion.     Right hip: Tenderness and bony tenderness present. Decreased range of motion. Decreased strength.  Neurological:     Mental Status: She is alert and oriented to person, place, and time.  Psychiatric:        Behavior: Behavior normal.    Vital signs in last 24 hours:    Labs:   Estimated body mass index is 23.78 kg/m as calculated from the following:   Height as of this encounter: '5\' 2"'$  (1.575 m).   Weight as of this encounter: 59  kg.   Imaging Review Plain radiographs demonstrate severe degenerative joint disease of the right hip(s). The bone quality appears to be good for age and reported activity level.      Assessment/Plan:  End stage arthritis, right hip(s)  The patient history, physical examination, clinical judgement of the provider and imaging studies are consistent with end stage degenerative joint disease of the right hip(s) and total hip arthroplasty is deemed medically necessary. The treatment options including medical management, injection therapy, arthroscopy and arthroplasty were discussed at length. The risks  and benefits of total hip arthroplasty were presented and reviewed. The risks due to aseptic loosening, infection, stiffness, dislocation/subluxation,  thromboembolic complications and other imponderables were discussed.  The patient acknowledged the explanation, agreed to proceed with the plan and consent was signed. Patient is being admitted for inpatient treatment for surgery, pain control, PT, OT, prophylactic antibiotics, VTE prophylaxis, progressive ambulation and ADL's and discharge planning.The patient is planning to be discharged home with home health services

## 2021-03-05 ENCOUNTER — Observation Stay (HOSPITAL_COMMUNITY)
Admission: RE | Admit: 2021-03-05 | Discharge: 2021-03-07 | Disposition: A | Discharge status: Home / Self Care | Payer: Medicare HMO | Attending: Orthopaedic Surgery | Admitting: Orthopaedic Surgery

## 2021-03-05 ENCOUNTER — Other Ambulatory Visit: Payer: Self-pay

## 2021-03-05 ENCOUNTER — Ambulatory Visit (HOSPITAL_COMMUNITY): Payer: Medicare HMO

## 2021-03-05 ENCOUNTER — Ambulatory Visit (HOSPITAL_COMMUNITY): Payer: Medicare HMO | Admitting: Physician Assistant

## 2021-03-05 ENCOUNTER — Encounter (HOSPITAL_COMMUNITY): Payer: Self-pay | Admitting: Orthopaedic Surgery

## 2021-03-05 ENCOUNTER — Encounter (HOSPITAL_COMMUNITY)
Admission: RE | Disposition: A | Discharge status: Home / Self Care | Payer: Self-pay | Source: Home / Self Care | Attending: Orthopaedic Surgery

## 2021-03-05 ENCOUNTER — Ambulatory Visit (HOSPITAL_COMMUNITY): Payer: Medicare HMO | Admitting: Anesthesiology

## 2021-03-05 ENCOUNTER — Observation Stay (HOSPITAL_COMMUNITY): Payer: Medicare HMO

## 2021-03-05 DIAGNOSIS — M1611 Unilateral primary osteoarthritis, right hip: Principal | ICD-10-CM | POA: Insufficient documentation

## 2021-03-05 DIAGNOSIS — Z96641 Presence of right artificial hip joint: Secondary | ICD-10-CM | POA: Diagnosis not present

## 2021-03-05 DIAGNOSIS — Z419 Encounter for procedure for purposes other than remedying health state, unspecified: Secondary | ICD-10-CM

## 2021-03-05 DIAGNOSIS — J310 Chronic rhinitis: Secondary | ICD-10-CM | POA: Diagnosis not present

## 2021-03-05 DIAGNOSIS — Z471 Aftercare following joint replacement surgery: Secondary | ICD-10-CM | POA: Diagnosis not present

## 2021-03-05 DIAGNOSIS — N952 Postmenopausal atrophic vaginitis: Secondary | ICD-10-CM | POA: Diagnosis not present

## 2021-03-05 DIAGNOSIS — E7800 Pure hypercholesterolemia, unspecified: Secondary | ICD-10-CM | POA: Diagnosis not present

## 2021-03-05 HISTORY — PX: TOTAL HIP ARTHROPLASTY: SHX124

## 2021-03-05 LAB — TYPE AND SCREEN
ABO/RH(D): A POS
Antibody Screen: NEGATIVE

## 2021-03-05 LAB — ABO/RH: ABO/RH(D): A POS

## 2021-03-05 SURGERY — ARTHROPLASTY, HIP, TOTAL, ANTERIOR APPROACH
Anesthesia: Monitor Anesthesia Care | Site: Hip | Laterality: Right

## 2021-03-05 MED ORDER — OXYCODONE HCL 5 MG PO TABS: 5.0000 mg | ORAL_TABLET | ORAL | 0 refills | Status: DC | PRN

## 2021-03-05 MED ORDER — BUPIVACAINE IN DEXTROSE 0.75-8.25 % IT SOLN
INTRATHECAL | Status: DC | PRN
  Administered 2021-03-05: 1.6 mL via INTRATHECAL

## 2021-03-05 MED ORDER — PANTOPRAZOLE SODIUM 40 MG PO TBEC
40.0000 mg | DELAYED_RELEASE_TABLET | Freq: Every day | ORAL | Status: DC
  Administered 2021-03-06 – 2021-03-07 (×2): 40 mg via ORAL
  Filled 2021-03-05 (×2): qty 1

## 2021-03-05 MED ORDER — ACETAMINOPHEN 325 MG PO TABS: 325.0000 mg | ORAL_TABLET | ORAL | Status: DC | PRN

## 2021-03-05 MED ORDER — ONDANSETRON HCL 4 MG/2ML IJ SOLN
INTRAMUSCULAR | Status: AC
  Filled 2021-03-05: qty 2

## 2021-03-05 MED ORDER — MENTHOL 3 MG MT LOZG: 1.0000 | LOZENGE | OROMUCOSAL | Status: DC | PRN

## 2021-03-05 MED ORDER — VITAMIN D 25 MCG (1000 UNIT) PO TABS
1000.0000 [IU] | ORAL_TABLET | Freq: Every day | ORAL | Status: DC
  Administered 2021-03-06 – 2021-03-07 (×2): 1000 [IU] via ORAL
  Filled 2021-03-05 (×2): qty 1

## 2021-03-05 MED ORDER — ONDANSETRON HCL 4 MG/2ML IJ SOLN: 4.0000 mg | Freq: Once | INTRAMUSCULAR | Status: DC | PRN

## 2021-03-05 MED ORDER — METHOCARBAMOL 500 MG PO TABS
500.0000 mg | ORAL_TABLET | Freq: Four times a day (QID) | ORAL | Status: DC | PRN
  Administered 2021-03-05 – 2021-03-07 (×3): 500 mg via ORAL
  Filled 2021-03-05 (×3): qty 1

## 2021-03-05 MED ORDER — ACETAMINOPHEN 160 MG/5ML PO SOLN: 325.0000 mg | ORAL | Status: DC | PRN

## 2021-03-05 MED ORDER — ADULT MULTIVITAMIN W/MINERALS CH
1.0000 | ORAL_TABLET | Freq: Every day | ORAL | Status: DC
  Administered 2021-03-06 – 2021-03-07 (×2): 1 via ORAL
  Filled 2021-03-05 (×2): qty 1

## 2021-03-05 MED ORDER — CEFAZOLIN SODIUM-DEXTROSE 2-4 GM/100ML-% IV SOLN
2.0000 g | INTRAVENOUS | Status: AC
  Administered 2021-03-05: 2 g via INTRAVENOUS
  Filled 2021-03-05: qty 100

## 2021-03-05 MED ORDER — POLYETHYLENE GLYCOL 3350 17 G PO PACK
17.0000 g | PACK | Freq: Every day | ORAL | Status: DC | PRN
Start: 2021-03-05 — End: 2021-03-07

## 2021-03-05 MED ORDER — ONDANSETRON HCL 4 MG/2ML IJ SOLN
4.0000 mg | Freq: Four times a day (QID) | INTRAMUSCULAR | Status: DC | PRN
  Administered 2021-03-05 – 2021-03-06 (×3): 4 mg via INTRAVENOUS
  Filled 2021-03-05 (×3): qty 2

## 2021-03-05 MED ORDER — OXYCODONE HCL 5 MG/5ML PO SOLN
5.0000 mg | Freq: Once | ORAL | Status: DC | PRN
Start: 2021-03-05 — End: 2021-03-05

## 2021-03-05 MED ORDER — MEPERIDINE HCL 50 MG/ML IJ SOLN: 6.2500 mg | INTRAMUSCULAR | Status: DC | PRN

## 2021-03-05 MED ORDER — PRAVASTATIN SODIUM 20 MG PO TABS
20.0000 mg | ORAL_TABLET | Freq: Every day | ORAL | Status: DC
  Administered 2021-03-05 – 2021-03-06 (×2): 20 mg via ORAL
  Filled 2021-03-05 (×2): qty 1

## 2021-03-05 MED ORDER — METOCLOPRAMIDE HCL 5 MG PO TABS: 5.0000 mg | ORAL_TABLET | Freq: Three times a day (TID) | ORAL | Status: DC | PRN

## 2021-03-05 MED ORDER — POVIDONE-IODINE 10 % EX SWAB
2.0000 "application " | Freq: Once | CUTANEOUS | Status: AC
  Administered 2021-03-05: 2 via TOPICAL

## 2021-03-05 MED ORDER — ALUM & MAG HYDROXIDE-SIMETH 200-200-20 MG/5ML PO SUSP
30.0000 mL | ORAL | Status: DC | PRN
Start: 2021-03-05 — End: 2021-03-07

## 2021-03-05 MED ORDER — FENTANYL CITRATE (PF) 100 MCG/2ML IJ SOLN: 25.0000 ug | INTRAMUSCULAR | Status: DC | PRN

## 2021-03-05 MED ORDER — PROPOFOL 500 MG/50ML IV EMUL
INTRAVENOUS | Status: DC | PRN
  Administered 2021-03-05: 100 ug/kg/min via INTRAVENOUS

## 2021-03-05 MED ORDER — METHOCARBAMOL 500 MG IVPB - SIMPLE MED
500.0000 mg | Freq: Four times a day (QID) | INTRAVENOUS | Status: DC | PRN
  Filled 2021-03-05: qty 50

## 2021-03-05 MED ORDER — CHLORHEXIDINE GLUCONATE 0.12 % MT SOLN
15.0000 mL | Freq: Once | OROMUCOSAL | Status: AC
Start: 2021-03-05 — End: 2021-03-05
  Administered 2021-03-05: 15 mL via OROMUCOSAL

## 2021-03-05 MED ORDER — ONDANSETRON 4 MG PO TBDP: 4.0000 mg | ORAL_TABLET | Freq: Three times a day (TID) | ORAL | 0 refills | Status: DC | PRN

## 2021-03-05 MED ORDER — ASPIRIN 81 MG PO CHEW
81.0000 mg | CHEWABLE_TABLET | Freq: Two times a day (BID) | ORAL | Status: DC
  Administered 2021-03-05 – 2021-03-07 (×4): 81 mg via ORAL
  Filled 2021-03-05 (×4): qty 1

## 2021-03-05 MED ORDER — HYDROMORPHONE HCL 1 MG/ML IJ SOLN: 0.5000 mg | INTRAMUSCULAR | Status: DC | PRN

## 2021-03-05 MED ORDER — FENTANYL CITRATE (PF) 100 MCG/2ML IJ SOLN
INTRAMUSCULAR | Status: AC
  Filled 2021-03-05: qty 2

## 2021-03-05 MED ORDER — OXYCODONE HCL 5 MG PO TABS: 5.0000 mg | ORAL_TABLET | Freq: Once | ORAL | Status: DC | PRN

## 2021-03-05 MED ORDER — CEFAZOLIN SODIUM-DEXTROSE 1-4 GM/50ML-% IV SOLN
1.0000 g | Freq: Four times a day (QID) | INTRAVENOUS | Status: AC
  Administered 2021-03-05 (×2): 1 g via INTRAVENOUS
  Filled 2021-03-05 (×2): qty 50

## 2021-03-05 MED ORDER — LACTATED RINGERS IV SOLN: INTRAVENOUS | Status: DC

## 2021-03-05 MED ORDER — PROPOFOL 1000 MG/100ML IV EMUL
INTRAVENOUS | Status: AC
  Filled 2021-03-05: qty 100

## 2021-03-05 MED ORDER — 0.9 % SODIUM CHLORIDE (POUR BTL) OPTIME
TOPICAL | Status: DC | PRN
  Administered 2021-03-05: 1000 mL

## 2021-03-05 MED ORDER — ORAL CARE MOUTH RINSE: 15.0000 mL | Freq: Once | OROMUCOSAL | Status: AC

## 2021-03-05 MED ORDER — OXYCODONE HCL 5 MG PO TABS
ORAL_TABLET | ORAL | Status: AC
  Filled 2021-03-05: qty 1

## 2021-03-05 MED ORDER — PHENOL 1.4 % MT LIQD: 1.0000 | OROMUCOSAL | Status: DC | PRN

## 2021-03-05 MED ORDER — PROPOFOL 10 MG/ML IV BOLUS
INTRAVENOUS | Status: DC | PRN
  Administered 2021-03-05: 30 mg via INTRAVENOUS

## 2021-03-05 MED ORDER — METOCLOPRAMIDE HCL 5 MG/ML IJ SOLN
5.0000 mg | Freq: Three times a day (TID) | INTRAMUSCULAR | Status: DC | PRN
  Administered 2021-03-06: 10 mg via INTRAVENOUS
  Filled 2021-03-05: qty 2

## 2021-03-05 MED ORDER — OXYCODONE HCL 5 MG PO TABS: 10.0000 mg | ORAL_TABLET | ORAL | Status: DC | PRN

## 2021-03-05 MED ORDER — DIPHENHYDRAMINE HCL 12.5 MG/5ML PO ELIX: 12.5000 mg | ORAL_SOLUTION | ORAL | Status: DC | PRN

## 2021-03-05 MED ORDER — PHENYLEPHRINE HCL-NACL 20-0.9 MG/250ML-% IV SOLN
INTRAVENOUS | Status: DC | PRN
  Administered 2021-03-05: 25 ug/min via INTRAVENOUS

## 2021-03-05 MED ORDER — ONDANSETRON HCL 4 MG PO TABS: 4.0000 mg | ORAL_TABLET | Freq: Four times a day (QID) | ORAL | Status: DC | PRN

## 2021-03-05 MED ORDER — METHOCARBAMOL 500 MG PO TABS: 500.0000 mg | ORAL_TABLET | Freq: Four times a day (QID) | ORAL | 1 refills | Status: DC | PRN

## 2021-03-05 MED ORDER — DOCUSATE SODIUM 100 MG PO CAPS
100.0000 mg | ORAL_CAPSULE | Freq: Two times a day (BID) | ORAL | Status: DC
  Administered 2021-03-05 – 2021-03-07 (×4): 100 mg via ORAL
  Filled 2021-03-05 (×4): qty 1

## 2021-03-05 MED ORDER — OXYCODONE HCL 5 MG PO TABS
5.0000 mg | ORAL_TABLET | ORAL | Status: DC | PRN
  Administered 2021-03-05 (×2): 5 mg via ORAL
  Administered 2021-03-05 – 2021-03-06 (×3): 10 mg via ORAL
  Filled 2021-03-05: qty 1
  Filled 2021-03-05 (×3): qty 2

## 2021-03-05 MED ORDER — ACETAMINOPHEN 325 MG PO TABS
325.0000 mg | ORAL_TABLET | Freq: Four times a day (QID) | ORAL | Status: DC | PRN
  Administered 2021-03-06 – 2021-03-07 (×2): 650 mg via ORAL
  Filled 2021-03-05 (×2): qty 2

## 2021-03-05 MED ORDER — TRANEXAMIC ACID-NACL 1000-0.7 MG/100ML-% IV SOLN
1000.0000 mg | INTRAVENOUS | Status: AC
  Administered 2021-03-05: 1000 mg via INTRAVENOUS
  Filled 2021-03-05: qty 100

## 2021-03-05 MED ORDER — SODIUM CHLORIDE 0.9 % IV SOLN: INTRAVENOUS | Status: DC

## 2021-03-05 MED ORDER — SODIUM CHLORIDE 0.9 % IR SOLN
Status: DC | PRN
  Administered 2021-03-05: 2000 mL

## 2021-03-05 SURGICAL SUPPLY — 39 items
BAG COUNTER SPONGE SURGICOUNT (BAG) ×2 IMPLANT
BAG ZIPLOCK 12X15 (MISCELLANEOUS) IMPLANT
BENZOIN TINCTURE PRP APPL 2/3 (GAUZE/BANDAGES/DRESSINGS) IMPLANT
BLADE SAW SGTL 18X1.27X75 (BLADE) ×2 IMPLANT
COVER PERINEAL POST (MISCELLANEOUS) ×2 IMPLANT
COVER SURGICAL LIGHT HANDLE (MISCELLANEOUS) ×2 IMPLANT
CUP ACET PINNACLE SECTR 48MM (Joint) ×1 IMPLANT
DRAPE FOOT SWITCH (DRAPES) ×2 IMPLANT
DRAPE STERI IOBAN 125X83 (DRAPES) ×2 IMPLANT
DRAPE U-SHAPE 47X51 STRL (DRAPES) ×4 IMPLANT
DRSG AQUACEL AG ADV 3.5X10 (GAUZE/BANDAGES/DRESSINGS) ×2 IMPLANT
DURAPREP 26ML APPLICATOR (WOUND CARE) ×2 IMPLANT
ELECT REM PT RETURN 15FT ADLT (MISCELLANEOUS) ×2 IMPLANT
GAUZE XEROFORM 1X8 LF (GAUZE/BANDAGES/DRESSINGS) ×2 IMPLANT
GLOVE SRG 8 PF TXTR STRL LF DI (GLOVE) ×2 IMPLANT
GLOVE SURG ENC MOIS LTX SZ7.5 (GLOVE) ×2 IMPLANT
GLOVE SURG NEOPR MICRO LF SZ8 (GLOVE) ×2 IMPLANT
GLOVE SURG UNDER POLY LF SZ8 (GLOVE) ×2
GOWN STRL REUS W/TWL XL LVL3 (GOWN DISPOSABLE) ×4 IMPLANT
HANDPIECE INTERPULSE COAX TIP (DISPOSABLE) ×1
HEAD FEMORAL 32 CERAMIC (Hips) ×2 IMPLANT
HOLDER FOLEY CATH W/STRAP (MISCELLANEOUS) ×2 IMPLANT
KIT TURNOVER KIT A (KITS) ×2 IMPLANT
LINER ACET 32X48 (Liner) ×2 IMPLANT
PACK ANTERIOR HIP CUSTOM (KITS) ×2 IMPLANT
PENCIL SMOKE EVACUATOR (MISCELLANEOUS) IMPLANT
PINNSECTOR W/GRIP ACE CUP 48MM (Joint) ×2 IMPLANT
SET HNDPC FAN SPRY TIP SCT (DISPOSABLE) ×1 IMPLANT
STAPLER VISISTAT 35W (STAPLE) IMPLANT
STEM CORAIL KA10 (Stem) ×2 IMPLANT
STRIP CLOSURE SKIN 1/2X4 (GAUZE/BANDAGES/DRESSINGS) IMPLANT
SUT ETHIBOND NAB CT1 #1 30IN (SUTURE) ×2 IMPLANT
SUT ETHILON 2 0 PS N (SUTURE) IMPLANT
SUT MNCRL AB 4-0 PS2 18 (SUTURE) IMPLANT
SUT VIC AB 0 CT1 36 (SUTURE) ×2 IMPLANT
SUT VIC AB 1 CT1 36 (SUTURE) ×2 IMPLANT
SUT VIC AB 2-0 CT1 27 (SUTURE) ×2
SUT VIC AB 2-0 CT1 TAPERPNT 27 (SUTURE) ×2 IMPLANT
TRAY FOLEY MTR SLVR 16FR STAT (SET/KITS/TRAYS/PACK) IMPLANT

## 2021-03-05 NOTE — Brief Op Note (Signed)
03/05/2021  12:26 PM  PATIENT:  Jocelyn Morgan  70 y.o. female  PRE-OPERATIVE DIAGNOSIS:  right hip osteoarthritis  POST-OPERATIVE DIAGNOSIS:  right hip osteoarthritis  PROCEDURE:  Procedure(s): RIGHT TOTAL HIP ARTHROPLASTY ANTERIOR APPROACH (Right)  SURGEON:  Surgeon(s) and Role:    Mcarthur Rossetti, MD - Primary  PHYSICIAN ASSISTANT: Benita Stabile, PA-C  ANESTHESIA:   spinal  EBL:  125 mL   COUNTS:  YES  DICTATION: .Other Dictation: Dictation Number [id number]  PLAN OF CARE: Admit to inpatient   PATIENT DISPOSITION:  PACU - hemodynamically stable.   Delay start of Pharmacological VTE agent (>24hrs) due to surgical blood loss or risk of bleeding: no

## 2021-03-05 NOTE — Transfer of Care (Signed)
Immediate Anesthesia Transfer of Care Note  Patient: Jocelyn Morgan  Procedure(s) Performed: RIGHT TOTAL HIP ARTHROPLASTY ANTERIOR APPROACH (Right: Hip)  Patient Location: PACU  Anesthesia Type:Spinal  Level of Consciousness: awake, alert  and oriented  Airway & Oxygen Therapy: Patient Spontanous Breathing and Patient connected to face mask oxygen  Post-op Assessment: Report given to RN and Post -op Vital signs reviewed and stable  Post vital signs: Reviewed and stable  Last Vitals:  Vitals Value Taken Time  BP 116/48 03/05/21 1245  Temp    Pulse 82 03/05/21 1245  Resp 14 03/05/21 1245  SpO2 100 % 03/05/21 1245  Vitals shown include unvalidated device data.  Last Pain:  Vitals:   03/05/21 0857  TempSrc: Oral  PainSc: 0-No pain      Patients Stated Pain Goal: 3 ([id number])  Complications: No notable events documented.

## 2021-03-05 NOTE — Evaluation (Signed)
Physical Therapy Evaluation Patient Details Name: Jocelyn Morgan MRN: [id number] DOB: 1950/09/30 Today's Date: 03/05/2021   History of Present Illness  Patient is 70 y.o. female s/p Rt THA anterior approach on 03/05/21 with PMH significant for OA, HLD.  Clinical Impression  LAHOMA CONNICK is a 70 y.o. female POD 0 s/p Rt THA. Patient reports independence with mobility at baseline. Patient is now limited by functional impairments (see PT problem list below) and requires min assist for bed mobility and transfers with RW. Patient was limited by symptomatic hypotension sitting EOB. Pt was able to stand to take small steps up to [facility] with RW and min assist. Patient instructed in exercise to facilitate circulation. Patient will benefit from continued skilled PT interventions to address impairments and progress towards PLOF. Acute PT will follow to progress mobility and stair training in preparation for safe discharge home.     Follow Up Recommendations Follow surgeon's recommendation for DC plan and follow-up therapies;Home health PT    Equipment Recommendations  None recommended by PT    Recommendations for Other Services       Precautions / Restrictions Precautions Precautions: Fall Restrictions Weight Bearing Restrictions: No RLE Weight Bearing: Weight bearing as tolerated      Mobility  Bed Mobility Overal bed mobility: Needs Assistance Bed Mobility: Supine to Sit;Sit to Supine     Supine to sit: Min assist;HOB elevated Sit to supine: Min assist   General bed mobility comments: cues to use bed rail and assist for Rt LE off/onto EOB.    Transfers Overall transfer level: Needs assistance Equipment used: Rolling walker (2 wheeled) Transfers: Sit to/from Stand Sit to Stand: Min assist         General transfer comment: cuse for hand placement on EOB for power up. pt took small steps up to [facility]. gait deferred and pt returned to sit EOb and then supien due to diaphoresis and  overheated sensation.  Ambulation/Gait                Stairs            Wheelchair Mobility    Modified Rankin (Stroke Patients Only)       Balance Overall balance assessment: Needs assistance Sitting-balance support: Feet supported Sitting balance-Leahy Scale: Good     Standing balance support: During functional activity;Bilateral upper extremity supported Standing balance-Leahy Scale: Poor                               Pertinent Vitals/Pain Pain Assessment: 0-10 Pain Score: 3  Pain Descriptors / Indicators: Aching;Grimacing;Discomfort Pain Intervention(s): Limited activity within patient's tolerance;Monitored during session;Repositioned;Premedicated before session    Home Living Family/patient expects to be discharged to:: Private residence Living Arrangements: Spouse/significant other Available Help at Discharge: Family Type of Home: House Home Access: Stairs to enter Entrance Stairs-Rails: None Entrance Stairs-Number of Steps: 1 Home Layout: One level Home Equipment: Environmental consultant - 2 wheels;Bedside commode      Prior Function Level of Independence: Independent               Hand Dominance   Dominant Hand: Right    Extremity/Trunk Assessment   Upper Extremity Assessment Upper Extremity Assessment: Overall WFL for tasks assessed    Lower Extremity Assessment Lower Extremity Assessment: Overall WFL for tasks assessed    Cervical / Trunk Assessment Cervical / Trunk Assessment: Normal  Communication   Communication: No difficulties  Cognition Arousal/Alertness:  Awake/alert Behavior During Therapy: WFL for tasks assessed/performed Overall Cognitive Status: Within Functional Limits for tasks assessed                                        General Comments      Exercises Total Joint Exercises Ankle Circles/Pumps: AROM;Both;10 reps;Supine   Assessment/Plan    PT Assessment Patient needs continued PT  services  PT Problem List Decreased range of motion;Decreased strength;Decreased activity tolerance;Decreased balance;Decreased mobility;Decreased knowledge of use of DME;Decreased knowledge of precautions;Pain       PT Treatment Interventions DME instruction;Gait training;Stair training;Functional mobility training;Therapeutic activities;Therapeutic exercise;Balance training;Patient/family education    PT Goals (Current goals can be found in the Care Plan section)  Acute Rehab PT Goals Patient Stated Goal: get recovered and independence back PT Goal Formulation: With patient Time For Goal Achievement: 03/12/21 Potential to Achieve Goals: Good    Frequency 7X/week   Barriers to discharge        Co-evaluation               AM-PAC PT "6 Clicks" Mobility  Outcome Measure Help needed turning from your back to your side while in a flat bed without using bedrails?: A Little Help needed moving from lying on your back to sitting on the side of a flat bed without using bedrails?: A Little Help needed moving to and from a bed to a chair (including a wheelchair)?: A Little Help needed standing up from a chair using your arms (e.g., wheelchair or bedside chair)?: A Little Help needed to walk in hospital room?: A Lot Help needed climbing 3-5 steps with a railing? : A Lot 6 Click Score: 16    End of Session   Activity Tolerance: Treatment limited secondary to medical complications (Comment) (hypotension) Patient left: in bed;with call bell/phone within reach;with bed alarm set;with nursing/sitter in room;with family/visitor present Nurse Communication: Mobility status PT Visit Diagnosis: Muscle weakness (generalized) (M62.81);Difficulty in walking, not elsewhere classified (R26.2)    Time: [id number] PT Time Calculation (min) (ACUTE ONLY): 19 min   Charges:   PT Evaluation $PT Eval Low Complexity: 1 Low          Verner Mould, DPT Acute Rehabilitation Services Office  [phone] Pager [phone]   Jacques Navy 03/05/2021, 4:45 PM

## 2021-03-05 NOTE — Discharge Instructions (Signed)

## 2021-03-05 NOTE — Plan of Care (Signed)
  Problem: Activity: Goal: Ability to avoid complications of mobility impairment will improve Outcome: Progressing   Problem: Clinical Measurements: Goal: Postoperative complications will be avoided or minimized Outcome: Progressing   Problem: Pain Management: Goal: Pain level will decrease with appropriate interventions Outcome: Progressing   Problem: Skin Integrity: Goal: Will show signs of wound healing Outcome: Progressing   

## 2021-03-05 NOTE — Anesthesia Preprocedure Evaluation (Addendum)
Anesthesia Evaluation  Patient identified by MRN, date of birth, ID band Patient awake    Reviewed: Allergy & Precautions, H&P , NPO status , Patient's Chart, lab work & pertinent test results, reviewed documented beta blocker date and time   Airway Mallampati: II  TM Distance: >3 FB Neck ROM: full    Dental no notable dental hx. (+) Teeth Intact, Dental Advisory Given   Pulmonary neg pulmonary ROS,    Pulmonary exam normal breath sounds clear to auscultation       Cardiovascular Exercise Tolerance: Good negative cardio ROS   Rhythm:regular Rate:Normal     Neuro/Psych  Neuromuscular disease negative psych ROS   GI/Hepatic negative GI ROS, Neg liver ROS,   Endo/Other  negative endocrine ROS  Renal/GU negative Renal ROS  negative genitourinary   Musculoskeletal  (+) Arthritis , Osteoarthritis,    Abdominal   Peds  Hematology negative hematology ROS (+)   Anesthesia Other Findings   Reproductive/Obstetrics negative OB ROS                            Anesthesia Physical Anesthesia Plan  ASA: 2  Anesthesia Plan: MAC and Spinal   Post-op Pain Management:    Induction:   PONV Risk Score and Plan: 2 and Propofol infusion  Airway Management Planned: Nasal Cannula, Natural Airway and Simple Face Mask  Additional Equipment: None  Intra-op Plan:   Post-operative Plan:   Informed Consent: I have reviewed the patients History and Physical, chart, labs and discussed the procedure including the risks, benefits and alternatives for the proposed anesthesia with the patient or authorized representative who has indicated his/her understanding and acceptance.     Dental Advisory Given  Plan Discussed with: CRNA and Anesthesiologist  Anesthesia Plan Comments:        Anesthesia Quick Evaluation

## 2021-03-05 NOTE — Anesthesia Procedure Notes (Signed)
Spinal  Patient location during procedure: OR End time: 03/05/2021 11:15 AM Reason for block: surgical anesthesia Staffing Performed: resident/CRNA  Anesthesiologist: Janeece Riggers, MD Resident/CRNA: Maxwell Caul, CRNA Preanesthetic Checklist Completed: patient identified, IV checked, site marked, risks and benefits discussed, surgical consent, monitors and equipment checked, pre-op evaluation and timeout performed Spinal Block Patient position: sitting Prep: DuraPrep Patient monitoring: heart rate, cardiac monitor, continuous pulse ox and blood pressure Approach: midline Location: L3-4 Injection technique: single-shot Needle Needle type: Pencan  Needle gauge: 24 G Needle length: 10 cm Assessment Sensory level: T4 Events: CSF return Additional Notes IV functioning, monitors applied to pt. Expiration date of kit checked and confirmed to be in date. Sterile prep and drape, hand hygiene and sterile gloves used. Pt was positioned and spine was prepped in sterile fashion. Skin was anesthetized with lidocaine. Free flow of clear CSF obtained prior to injecting local anesthetic into CSF x 1 attempt. Spinal needle aspirated freely following injection. Needle was carefully withdrawn, and pt tolerated procedure well. Loss of motor and sensory on exam post injection. Dr Ambrose Pancoast at bedside during entire placement.

## 2021-03-05 NOTE — Interval H&P Note (Signed)
History and Physical Interval Note: The patient understands fully that she is here today for a right total hip arthroplasty to treat her right hip osteoarthritis.  There has been no acute or interval change in her medical status.  See recent H&P.  The risks and benefits of surgery have been explained in detail and informed consent is obtained.  03/05/2021 9:57 AM  Jocelyn Morgan  has presented today for surgery, with the diagnosis of right hip osteoarthritis.  The various methods of treatment have been discussed with the patient and family. After consideration of risks, benefits and other options for treatment, the patient has consented to  Procedure(s): RIGHT TOTAL HIP ARTHROPLASTY ANTERIOR APPROACH (Right) as a surgical intervention.  The patient's history has been reviewed, patient examined, no change in status, stable for surgery.  I have reviewed the patient's chart and labs.  Questions were answered to the patient's satisfaction.     Mcarthur Rossetti

## 2021-03-06 DIAGNOSIS — M1611 Unilateral primary osteoarthritis, right hip: Secondary | ICD-10-CM | POA: Diagnosis not present

## 2021-03-06 LAB — BASIC METABOLIC PANEL
Anion gap: 8 (ref 5–15)
BUN: 7 mg/dL — ABNORMAL LOW (ref 8–23)
CO2: 24 mmol/L (ref 22–32)
Calcium: 7.6 mg/dL — ABNORMAL LOW (ref 8.9–10.3)
Chloride: 95 mmol/L — ABNORMAL LOW (ref 98–111)
Creatinine, Ser: 0.5 mg/dL (ref 0.44–1.00)
GFR, Estimated: >60 mL/min (ref >60)
Glucose, Bld: 131 mg/dL — ABNORMAL HIGH (ref 70–99)
Potassium: 3.1 mmol/L — ABNORMAL LOW (ref 3.5–5.1)
Sodium: 127 mmol/L — ABNORMAL LOW (ref 135–145)

## 2021-03-06 LAB — CBC
HCT: 32.5 % — ABNORMAL LOW (ref 36.0–46.0)
Hemoglobin: 10.8 g/dL — ABNORMAL LOW (ref 12.0–15.0)
MCH: 31.6 pg (ref 26.0–34.0)
MCHC: 33.2 g/dL (ref 30.0–36.0)
MCV: 95 fL (ref 80.0–100.0)
Platelets: 278 10*3/uL (ref 150–400)
RBC: 3.42 MIL/uL — ABNORMAL LOW (ref 3.87–5.11)
RDW: 12.9 % (ref 11.5–15.5)
WBC: 13.1 10*3/uL — ABNORMAL HIGH (ref 4.0–10.5)
nRBC: 0 % (ref 0.0–0.2)

## 2021-03-06 NOTE — Progress Notes (Signed)
Physical Therapy Treatment Patient Details Name: Jocelyn Morgan MRN: [id number] DOB: April 16, 1951 Today's Date: 03/06/2021    History of Present Illness Patient is 70 y.o. female s/p Rt THA anterior approach on 03/05/21 with PMH significant for OA, HLD.    PT Comments    Unable to progress with mobility 2* nausea/vomiting. Assisted pt back to bed. Will progress activity as able.    Follow Up Recommendations  Follow surgeon's recommendation for DC plan and follow-up therapies;Supervision for mobility/OOB (plan is for HHPT)     Equipment Recommendations  None recommended by PT    Recommendations for Other Services       Precautions / Restrictions Precautions Precautions: Fall Restrictions Weight Bearing Restrictions: No RLE Weight Bearing: Weight bearing as tolerated    Mobility  Bed Mobility Overal bed mobility: Needs Assistance Bed Mobility: Sit to Supine     Supine to sit: Min assist;HOB elevated Sit to supine: Min assist   General bed mobility comments: Increased time. Cues for safety, technique. Assist for R LE.    Transfers Overall transfer level: Needs assistance Equipment used: Rolling walker (2 wheeled) Transfers: Sit to/from Stand Sit to Stand: Min assist         General transfer comment: Assist to rise, steady, control descent. Cues for safety, technique, hand/LE placement. Pt still nauseous-recently vomited. Took steps back to bed. Deferred ambulation on today.  Ambulation/Gait Ambulation/Gait assistance: Min assist;+2 safety/equipment Gait Distance (Feet): 2 Feet Assistive device: Rolling walker (2 wheeled) Gait Pattern/deviations: Step-to pattern     General Gait Details: Pt took a step or two over to the bed with RW. Deferred ambulation 2* pt c/o dizziness, nausea, and feeling clammy-recently vomited   Stairs             Wheelchair Mobility    Modified Rankin (Stroke Patients Only)       Balance Overall balance assessment:  Needs assistance         Standing balance support: Bilateral upper extremity supported Standing balance-Leahy Scale: Poor                              Cognition Arousal/Alertness: Awake/alert Behavior During Therapy: WFL for tasks assessed/performed Overall Cognitive Status: Within Functional Limits for tasks assessed                                        Exercises Total Joint Exercises Ankle Circles/Pumps: AROM;Both;10 reps Quad Sets: AROM;Both;10 reps Heel Slides: AAROM;Right;10 reps Hip ABduction/ADduction: AAROM;Right;10 reps    General Comments        Pertinent Vitals/Pain Pain Assessment: 0-10 Pain Score: 7  Pain Location: R hip/thigh Pain Descriptors / Indicators: Discomfort;Sore;Grimacing;Guarding Pain Intervention(s): Limited activity within patient's tolerance;Monitored during session;Repositioned    Home Living                      Prior Function            PT Goals (current goals can now be found in the care plan section) Progress towards PT goals: Progressing toward goals    Frequency    7X/week      PT Plan Current plan remains appropriate    Co-evaluation              AM-PAC PT "6 Clicks" Mobility   Outcome Measure  Help needed  turning from your back to your side while in a flat bed without using bedrails?: A Little Help needed moving from lying on your back to sitting on the side of a flat bed without using bedrails?: A Little Help needed moving to and from a bed to a chair (including a wheelchair)?: A Little Help needed standing up from a chair using your arms (e.g., wheelchair or bedside chair)?: A Little Help needed to walk in hospital room?: A Little Help needed climbing 3-5 steps with a railing? : A Lot 6 Click Score: 17    End of Session Equipment Utilized During Treatment: Gait belt Activity Tolerance: Patient limited by pain (limited buy N/V) Patient left: in chair;with call  bell/phone within reach;with family/visitor present   PT Visit Diagnosis: Other abnormalities of gait and mobility (R26.89)     Time: [id number] PT Time Calculation (min) (ACUTE ONLY): 12 min  Charges:  $Gait Training: 8-22 mins                         Doreatha Massed, PT Acute Rehabilitation  Office: [phone] Pager: [phone]

## 2021-03-06 NOTE — TOC Transition Note (Signed)
Transition of Care [facility]) - CM/SW Discharge Note   Patient Details  Name: Jocelyn Morgan MRN: [id number] Date of Birth: 01-Aug-1950  [facility] of [facility]) CM/SW Contact:  Lennart Pall, LCSW Phone Number: 03/06/2021, 10:31 AM   Clinical Narrative:    Met briefly with pt and confirming she has all needed DME at home.  Pt aware prearranged with [place] for HHPT.  No further TOC needs.   Final next level of care: [place] Barriers to Discharge: No Barriers Identified   Patient Goals and CMS Choice Patient states their goals for this hospitalization and ongoing recovery are:: return home      Discharge Placement                       Discharge Plan and Services                DME Arranged: N/A DME Agency: NA         HH Agency: [place]        Social Determinants of Health (SDOH) Interventions     Readmission Risk Interventions No flowsheet data found.

## 2021-03-06 NOTE — Plan of Care (Signed)
°  Problem: Education: °Goal: Knowledge of the prescribed therapeutic regimen will improve °Outcome: Progressing °  °Problem: Clinical Measurements: °Goal: Postoperative complications will be avoided or minimized °Outcome: Progressing °  °Problem: Pain Management: °Goal: Pain level will decrease with appropriate interventions °Outcome: Progressing °  °

## 2021-03-06 NOTE — Op Note (Signed)
NAME: Jocelyn Morgan, Jocelyn Morgan MEDICAL RECORD NO: [id number] ACCOUNT NO: [id number] DATE OF BIRTH: 1950-08-11 FACILITY: [facility]ATION: [id number] PHYSICIAN: Lind Guest. Ninfa Linden, MD  Operative Report   DATE OF PROCEDURE: 03/05/2021   PREOPERATIVE DIAGNOSIS:  Primary osteoarthritis, degenerative joint disease, right hip.  POSTOPERATIVE DIAGNOSIS:  Primary osteoarthritis, degenerative joint disease, right hip.  PROCEDURE:  Right total hip arthroplasty through direct anterior approach.  IMPLANTS:  DePuy sector Gription acetabular component, size 48, size 32+0 neutral polyethylene liner, size 10 Corail femoral component with standard offset, size 32+1 ceramic hip ball.  SURGEON:  Jean Rosenthal, M.D.   ASSISTANT: Erskine Emery, PA-C  ANESTHESIA:  Spinal.  ANTIBIOTICS:  2 g IV Ancef.  ESTIMATED BLOOD LOSS:  Less than 200 mL.  COMPLICATIONS:  None.  INDICATIONS:  The patient is a 70 year old female with a well documented debilitating arthritis involving her right hip.  At this point, her right hip pain has become daily and is detrimentally affecting her mobility, her quality of life and activities  of daily living to the point she does wish to proceed with a total hip arthroplasty and we have recommended this as well.  We talked in length in detail about the risk of acute blood loss anemia, nerve or vessel injury, fracture, infection, DVT, implant  failure and skin and soft tissue issues.  We talked about our goals being decreased pain, improve mobility and overall improve quality of life.  DESCRIPTION OF PROCEDURE:  After informed consent was obtained, appropriate right hip was marked.  She was brought to the operating room and sat up on a stretcher where spinal anesthesia was obtained.  She was laid in supine position on the stretcher.   Foley catheter was placed and both feet had traction boots applied to them.  She was then placed supine on the Hana fracture table, the perineal post  in place and both legs in line skeletal traction device and no traction applied.  Her right operative  hip was prepped and draped with DuraPrep and sterile drapes.  A timeout was called and she was identified correct patient, correct right hip.  I then made an incision just inferior and posterior to the anterior superior iliac spine and carried this  obliquely down the leg.  We dissected down tensor fascia lata muscle.  Tensor fascia was then divided longitudinally to proceed with direct anterior approach to the hip.  We identified and cauterized circumflex vessels and then identified the hip capsule  and opened the hip capsule in L-type format finding a moderate joint effusion.  We placed curved retractors within the medial and lateral joint capsule and made our femoral neck cut with an oscillating saw just proximal to the lesser trochanter and  completed this with an osteotome, placed a corkscrew guide in the femoral head and removed the femoral head in its entirety and found a wide area with significant cartilage irregularity where I then placed a bent Hohmann over the medial acetabular rim  and removed remnants of acetabular labrum and other debris.  Then began reaming under direct visualization from a size 43 reamer in a stepwise increments going up to a size 47 with all reamers placed under direct visualization, the last reamer was also  placed under direct fluoroscopy, so I could obtain my depth of reaming my inclination and anteversion.  I then placed a real DePuy sector Gription acetabular component, size 48 with a 32+0 polyethylene liner for that size acetabular component.  Attention  was then turned to  the femur with the leg externally rotated 120 degrees and adducted, we placed a Mueller retractor medially and Hohman retractor behind the greater trochanter, we released the lateral joint capsule and used a box cutting osteotome to  enter the femoral canal and a rongeur to lateralize, then began  broaching using the Corail broaching system from a size 8 going all the way up to just a size 10.  We then trialed standard offset femoral neck and a 32+1 hip ball, reduced this in  acetabulum.  We were pleased with our stability on mechanical and clinical exam as well as assessing leg length and offset.  We then dislocated the hip and removed the trial components.  We placed the real Corail femoral component, size 10 with standard  offset and the real 32+1 ceramic hip ball and again reduced this into the acetabulum and it was stable.  We then irrigated the soft tissue with normal saline solution using pulsatile lavage.  We closed the joint capsule with #1 Ethibond suture followed  by #1 Vicryl to close the tensor fascia.  0 Vicryl was used to close the deep tissue and 2-0 Vicryl was used to close subcutaneous tissue.  The skin was closed with staples.  An Aquacel dressing was applied.  She was taken off of the Hana table and taken  to recovery room in stable condition with all final counts being correct.  No complications noted.  Of note, Benita Stabile, PA-C did assist during the entire case and assistance was crucial for facilitating all aspects of this case.       SUJ D: 03/05/2021 12:24:35 pm T: 03/06/2021 3:46:00 am  JOB: [id number]

## 2021-03-06 NOTE — Progress Notes (Addendum)
  Subjective: Jocelyn Morgan is a 70 y.o. female s/p right THA.  They are POD 1.  Pt's pain is controlled.  Pt has ambulated with some difficulty.  She has not done physical therapy yet today.  Denies any chest pain, shortness of breath, calf pain.  She does endorse some nausea that seems to be related to the pain medication.  She has vomited twice but currently her nausea is well controlled after receiving Zofran.   Objective: Vital signs in last 24 hours: Temp:  [97.5 F (36.4 C)-99.2 F (37.3 C)] 98.3 F (36.8 C) (08/13 1004) Pulse Rate:  [71-100] 79 (08/13 1004) Resp:  [12-20] 18 (08/13 1004) BP: (101-160)/(48-99) 123/62 (08/13 1004) SpO2:  [97 %-100 %] 97 % (08/13 1004)  Intake/Output from previous day: 08/12 0701 - 08/13 0700 In: 3912.4 [P.O.:910; I.V.:2852.4; IV Piggyback:150] Out: 2625 [Urine:2500; Blood:125] Intake/Output this shift: Total I/O In: 100 [P.O.:100] Out: 0   Exam:  No gross blood or drainage overlying the dressing 2+ DP pulse Sensation intact distally in the right foot Able to dorsiflex and plantarflex the right foot No calf tenderness bilaterally   Labs: Recent Labs    03/06/21 0326  HGB 10.8*   Recent Labs    03/06/21 0326  WBC 13.1*  RBC 3.42*  HCT 32.5*  PLT 278   Recent Labs    03/06/21 0326  NA 127*  K 3.1*  CL 95*  CO2 24  BUN 7*  CREATININE 0.50  GLUCOSE 131*  CALCIUM 7.6*   No results for input(s): LABPT, INR in the last 72 hours.  Assessment/Plan: Pt is POD 1 s/p right THA.    -Plan to discharge to home today or tomorrow pending patient's pain and PT eval  -WBAT with a walker  -Dr. Ninfa Linden to see this afternoon     Donella Stade 03/06/2021, 10:55 AM

## 2021-03-06 NOTE — Progress Notes (Signed)
Physical Therapy Treatment Patient Details Name: Jocelyn Morgan MRN: [id number] DOB: 26-Jun-1951 Today's Date: 03/06/2021    History of Present Illness Patient is 70 y.o. female s/p Rt THA anterior approach on 03/05/21 with PMH significant for OA, HLD.    PT Comments    Mobility remains limited by pt reported dizziness, nausea and feeling clammy. Orthostatics in flowsheet. Able to get her to recliner this am. Will continue ambulation attempts as safely able. Unfortunately, unsure if pt will meet PT goals on today.     Follow Up Recommendations  Follow surgeon's recommendation for DC plan and follow-up therapies;Supervision for mobility/OOB (plan is for HHPT)     Equipment Recommendations  None recommended by PT    Recommendations for Other Services       Precautions / Restrictions Precautions Precautions: Fall Restrictions Weight Bearing Restrictions: No RLE Weight Bearing: Weight bearing as tolerated    Mobility  Bed Mobility Overal bed mobility: Needs Assistance Bed Mobility: Supine to Sit     Supine to sit: Min assist;HOB elevated     General bed mobility comments: Increased time. Cues for safety, technique. Assist for R LE.    Transfers Overall transfer level: Needs assistance Equipment used: Rolling walker (2 wheeled) Transfers: Sit to/from Stand Sit to Stand: Min assist         General transfer comment: Assist to rise, steady, control descent. Cues for safety, technique, hand/LE placement. Pt c/o dizziness and had to sit after orthostatics. With encouragement, she was able to stand and pivot over to the recliner. She continues to report dizziness, nausea and feeling clammy.  Ambulation/Gait Ambulation/Gait assistance: Min assist;+2 safety/equipment Gait Distance (Feet): 2 Feet Assistive device: Rolling walker (2 wheeled) Gait Pattern/deviations: Step-to pattern     General Gait Details: Pt took a step or two over to the recliner with RW. Deferred  ambulation 2* pt c/o dizziness, nausea, and feeling clammy   Stairs             Wheelchair Mobility    Modified Rankin (Stroke Patients Only)       Balance Overall balance assessment: Needs assistance         Standing balance support: Bilateral upper extremity supported Standing balance-Leahy Scale: Poor                              Cognition Arousal/Alertness: Awake/alert Behavior During Therapy: WFL for tasks assessed/performed Overall Cognitive Status: Within Functional Limits for tasks assessed                                        Exercises Total Joint Exercises Ankle Circles/Pumps: AROM;Both;10 reps Quad Sets: AROM;Both;10 reps Heel Slides: AAROM;Right;10 reps Hip ABduction/ADduction: AAROM;Right;10 reps    General Comments        Pertinent Vitals/Pain Pain Assessment: 0-10 Pain Score: 7  Pain Location: R hip/thigh Pain Descriptors / Indicators: Discomfort;Sore;Grimacing;Guarding Pain Intervention(s): Limited activity within patient's tolerance;Monitored during session;Repositioned    Home Living                      Prior Function            PT Goals (current goals can now be found in the care plan section) Progress towards PT goals: Progressing toward goals    Frequency    7X/week  PT Plan Current plan remains appropriate    Co-evaluation              AM-PAC PT "6 Clicks" Mobility   Outcome Measure  Help needed turning from your back to your side while in a flat bed without using bedrails?: A Little Help needed moving from lying on your back to sitting on the side of a flat bed without using bedrails?: A Little Help needed moving to and from a bed to a chair (including a wheelchair)?: A Little Help needed standing up from a chair using your arms (e.g., wheelchair or bedside chair)?: A Little Help needed to walk in hospital room?: A Little Help needed climbing 3-5 steps with a  railing? : A Lot 6 Click Score: 17    End of Session Equipment Utilized During Treatment: Gait belt Activity Tolerance: Patient limited by fatigue;Patient limited by pain Patient left: in chair;with call bell/phone within reach;with family/visitor present   PT Visit Diagnosis: Muscle weakness (generalized) (M62.81);Difficulty in walking, not elsewhere classified (R26.2)     Time: [id number] PT Time Calculation (min) (ACUTE ONLY): 21 min  Charges:  $Gait Training: 8-22 mins                         Doreatha Massed, PT Acute Rehabilitation  Office: [phone] Pager: [phone]

## 2021-03-06 NOTE — Progress Notes (Signed)
After conversation with PA [facility], Covid test not ordered based on pt's previous Covid + status in June.

## 2021-03-06 NOTE — Progress Notes (Signed)
Patient ID: Jocelyn Morgan, female   DOB: 19-Jun-1951, 70 y.o.   MRN: [id number] She is having enough nausea and pain that it is probably better she stay today and be discharged to home tomorrow.

## 2021-03-07 DIAGNOSIS — M1611 Unilateral primary osteoarthritis, right hip: Secondary | ICD-10-CM | POA: Diagnosis not present

## 2021-03-07 LAB — CBC
HCT: 31.1 % — ABNORMAL LOW (ref 36.0–46.0)
Hemoglobin: 10.3 g/dL — ABNORMAL LOW (ref 12.0–15.0)
MCH: 31.4 pg (ref 26.0–34.0)
MCHC: 33.1 g/dL (ref 30.0–36.0)
MCV: 94.8 fL (ref 80.0–100.0)
Platelets: 232 10*3/uL (ref 150–400)
RBC: 3.28 MIL/uL — ABNORMAL LOW (ref 3.87–5.11)
RDW: 12.7 % (ref 11.5–15.5)
WBC: 10.7 10*3/uL — ABNORMAL HIGH (ref 4.0–10.5)
nRBC: 0 % (ref 0.0–0.2)

## 2021-03-07 MED ORDER — TRAMADOL HCL 50 MG PO TABS: 50.0000 mg | ORAL_TABLET | Freq: Four times a day (QID) | ORAL | 0 refills | Status: DC | PRN

## 2021-03-07 MED ORDER — TRAMADOL HCL 50 MG PO TABS
50.0000 mg | ORAL_TABLET | Freq: Four times a day (QID) | ORAL | Status: DC | PRN
Start: 2021-03-07 — End: 2021-03-07
  Administered 2021-03-07: 50 mg via ORAL
  Filled 2021-03-07: qty 1

## 2021-03-07 NOTE — Progress Notes (Signed)
Physical Therapy Treatment Patient Details Name: Jocelyn Morgan MRN: [id number] DOB: [birth year] Today's Date: 03/07/2021    History of Present Illness Patient is 70 y.o. female s/p Rt THA anterior approach on 03/05/21 with PMH significant for OA, HLD.    PT Comments    Progressing with mobility. Pt reports she feels much better today. Will plan to have a 2nd session to continue gait training and to practice ascending/descending 1 step to simulate entry into her home.     Follow Up Recommendations  Follow surgeon's recommendation for DC plan and follow-up therapies;Supervision for mobility/OOB (plan is for HHPT)     Equipment Recommendations  None recommended by PT    Recommendations for Other Services       Precautions / Restrictions Precautions Precautions: Fall Restrictions Weight Bearing Restrictions: No RLE Weight Bearing: Weight bearing as tolerated    Mobility  Bed Mobility               General bed mobility comments: oob in recliner    Transfers Overall transfer level: Needs assistance Equipment used: Rolling walker (2 wheeled) Transfers: Sit to/from Stand Sit to Stand: Min guard         General transfer comment: Min guard for safety. Increased time. Cues for safety, hand placement.  Ambulation/Gait Ambulation/Gait assistance: Min guard Gait Distance (Feet): 100 Feet Assistive device: Rolling walker (2 wheeled) Gait Pattern/deviations: Step-through pattern;Decreased stride length     General Gait Details: Cues for increaesed step length bilaterally to work towards reciprocal gait pattern. No dizziness reported. Pt tolerated distance well.   Stairs             Wheelchair Mobility    Modified Rankin (Stroke Patients Only)       Balance Overall balance assessment: Mild deficits observed, not formally tested         Standing balance support: Bilateral upper extremity supported Standing balance-Leahy Scale: Poor                               Cognition Arousal/Alertness: Awake/alert Behavior During Therapy: WFL for tasks assessed/performed Overall Cognitive Status: Within Functional Limits for tasks assessed                                        Exercises Total Joint Exercises Ankle Circles/Pumps: AROM;Both;10 reps Quad Sets: AROM;Both;10 reps Heel Slides: AAROM;Right;10 reps;Supine Hip ABduction/ADduction: AAROM;AROM;Right;10 reps;Supine Straight Leg Raises: AROM;Right;15 reps;Seated    General Comments        Pertinent Vitals/Pain Pain Assessment: 0-10 Pain Score: 6  Pain Location: R hip/thigh Pain Descriptors / Indicators: Discomfort;Sore;Grimacing;Guarding Pain Intervention(s): Monitored during session;Ice applied;Repositioned    Home Living                      Prior Function            PT Goals (current goals can now be found in the care plan section) Progress towards PT goals: Progressing toward goals    Frequency    7X/week      PT Plan Current plan remains appropriate    Co-evaluation              AM-PAC PT "6 Clicks" Mobility   Outcome Measure  Help needed turning from your back to your side while in a flat bed without using  bedrails?: A Little Help needed moving from lying on your back to sitting on the side of a flat bed without using bedrails?: A Little Help needed moving to and from a bed to a chair (including a wheelchair)?: A Little Help needed standing up from a chair using your arms (e.g., wheelchair or bedside chair)?: A Little Help needed to walk in hospital room?: A Little Help needed climbing 3-5 steps with a railing? : A Little 6 Click Score: 18    End of Session Equipment Utilized During Treatment: Gait belt Activity Tolerance: Patient tolerated treatment well Patient left: in chair;with call bell/phone within reach;with family/visitor present   PT Visit Diagnosis: Other abnormalities of gait and mobility  (R26.89)     Time: [id number] PT Time Calculation (min) (ACUTE ONLY): 24 min  Charges:  $Gait Training: 8-22 mins $Therapeutic Exercise: 8-22 mins                         Doreatha Massed, PT Acute Rehabilitation  Office: [phone] Pager: [phone]

## 2021-03-07 NOTE — Anesthesia Postprocedure Evaluation (Signed)
Anesthesia Post Note  Patient: Jocelyn Morgan  Procedure(s) Performed: RIGHT TOTAL HIP ARTHROPLASTY ANTERIOR APPROACH (Right: Hip)     Patient location during evaluation: PACU Anesthesia Type: MAC and Spinal Level of consciousness: oriented and awake and alert Pain management: pain level controlled Vital Signs Assessment: post-procedure vital signs reviewed and stable Respiratory status: spontaneous breathing, respiratory function stable and patient connected to nasal cannula oxygen Cardiovascular status: blood pressure returned to baseline and stable Postop Assessment: no headache, no backache and no apparent nausea or vomiting Anesthetic complications: no   No notable events documented.  Last Vitals:  Vitals:   03/07/21 0607 03/07/21 1338  BP: (!) 113/56 (!) 113/49  Pulse: 81 98  Resp: 16 18  Temp: 36.9 C 36.7 C  SpO2: 95% 100%    Last Pain:  Vitals:   03/07/21 1338  TempSrc: Oral  PainSc:    Pain Goal: Patients Stated Pain Goal: 2 (03/07/21 1204)                 Deavin Forst

## 2021-03-07 NOTE — Plan of Care (Signed)
  Problem: Education: Goal: Knowledge of the prescribed therapeutic regimen will improve Outcome: Progressing   Problem: Activity: Goal: Ability to avoid complications of mobility impairment will improve Outcome: Progressing   Problem: Pain Management: Goal: Pain level will decrease with appropriate interventions Outcome: Progressing   

## 2021-03-07 NOTE — Anesthesia Postprocedure Evaluation (Signed)
Anesthesia Post Note  Patient: JAZZMINE KLEIMAN  Procedure(s) Performed: RIGHT TOTAL HIP ARTHROPLASTY ANTERIOR APPROACH (Right: Hip)     Patient location during evaluation: PACU Anesthesia Type: MAC and Spinal Level of consciousness: awake and alert Pain management: pain level controlled Vital Signs Assessment: post-procedure vital signs reviewed and stable Respiratory status: spontaneous breathing, nonlabored ventilation, respiratory function stable and patient connected to nasal cannula oxygen Cardiovascular status: stable and blood pressure returned to baseline Postop Assessment: no apparent nausea or vomiting Anesthetic complications: no   No notable events documented.  Last Vitals:  Vitals:   03/07/21 0607 03/07/21 1338  BP: (!) 113/56 (!) 113/49  Pulse: 81 98  Resp: 16 18  Temp: 36.9 C 36.7 C  SpO2: 95% 100%    Last Pain:  Vitals:   03/07/21 1338  TempSrc: Oral  PainSc:    Pain Goal: Patients Stated Pain Goal: 2 (03/07/21 1204)                 Daja Shuping

## 2021-03-07 NOTE — Progress Notes (Signed)
  Subjective: Patient stable.  Having some nausea with oxycodone but otherwise doing well.  Taking Tylenol for pain.   Objective: Vital signs in last 24 hours: Temp:  [98.4 F (36.9 C)-99.3 F (37.4 C)] 98.5 F (36.9 C) (08/14 0607) Pulse Rate:  [81-100] 81 (08/14 0607) Resp:  [16-17] 16 (08/14 0607) BP: (112-116)/(56-67) 113/56 (08/14 0607) SpO2:  [95 %-97 %] 95 % (08/14 0607)  Intake/Output from previous day: 08/13 0701 - 08/14 0700 In: 1185.7 [P.O.:780; I.V.:405.7] Out: 1000 [Urine:1000] Intake/Output this shift: Total I/O In: 39.4 [I.V.:39.4] Out: -   Exam:  Sensation intact distally Dorsiflexion/Plantar flexion intact  Labs: Recent Labs    03/06/21 0326 03/07/21 0304  HGB 10.8* 10.3*   Recent Labs    03/06/21 0326 03/07/21 0304  WBC 13.1* 10.7*  RBC 3.42* 3.28*  HCT 32.5* 31.1*  PLT 278 232   Recent Labs    03/06/21 0326  NA 127*  K 3.1*  CL 95*  CO2 24  BUN 7*  CREATININE 0.50  GLUCOSE 131*  CALCIUM 7.6*   No results for input(s): LABPT, INR in the last 72 hours.  Assessment/Plan: Plan at this time is discharge to home after therapy this afternoon.  She has to navigate 1 stair at home.  We will add tramadol to her medication list.  Oxycodone is making her nauseated   Anderson Malta 03/07/2021, 10:42 AM

## 2021-03-07 NOTE — Plan of Care (Signed)
Patient dc'd  

## 2021-03-07 NOTE — Progress Notes (Signed)
Physical Therapy Treatment Patient Details Name: Jocelyn Morgan MRN: [id number] DOB: 03-10-1951 Today's Date: 03/07/2021    History of Present Illness Patient is 70 y.o. female s/p Rt THA anterior approach on 03/05/21 with PMH significant for OA, HLD.    PT Comments    2nd session to practice stair negotiation. Pain remains controlled. All education completed. Okay to d/c from PT stanpdoint.     Follow Up Recommendations  Follow surgeon's recommendation for DC plan and follow-up therapies;Supervision for mobility/OOB (plan is for HHPT)     Equipment Recommendations  None recommended by PT    Recommendations for Other Services       Precautions / Restrictions Precautions Precautions: Fall Restrictions Weight Bearing Restrictions: No RLE Weight Bearing: Weight bearing as tolerated    Mobility  Bed Mobility               General bed mobility comments: oob in recliner    Transfers Overall transfer level: Needs assistance Equipment used: Rolling walker (2 wheeled) Transfers: Sit to/from Stand Sit to Stand: Supervision         General transfer comment: Supv for safety. Increased time. Cues for safety, hand placement.  Ambulation/Gait Ambulation/Gait assistance: Supervision Gait Distance (Feet): 60 Feet Assistive device: Rolling walker (2 wheeled) Gait Pattern/deviations: Step-to pattern;Step-through pattern;Decreased stride length     General Gait Details: Cues for increaesed step length bilaterally to work towards reciprocal gait pattern. No dizziness reported. Pt tolerated distance well.   Stairs Stairs: Yes Stairs assistance: Min guard Stair Management: Step to pattern;Forwards;With walker Number of Stairs: 1 General stair comments: cues for safety, sequence, technique. Min guard for safety. husband present to observe.   Wheelchair Mobility    Modified Rankin (Stroke Patients Only)       Balance Overall balance assessment: Needs  assistance         Standing balance support: Bilateral upper extremity supported Standing balance-Leahy Scale: Fair                              Cognition Arousal/Alertness: Awake/alert Behavior During Therapy: WFL for tasks assessed/performed Overall Cognitive Status: Within Functional Limits for tasks assessed                                        Exercises     General Comments        Pertinent Vitals/Pain Pain Assessment: 0-10 Pain Score: 5  Pain Location: R hip/thigh Pain Descriptors / Indicators: Discomfort;Sore;Grimacing;Guarding Pain Intervention(s): Monitored during session;Repositioned    Home Living                      Prior Function            PT Goals (current goals can now be found in the care plan section) Progress towards PT goals: Progressing toward goals    Frequency    7X/week      PT Plan Current plan remains appropriate    Co-evaluation              AM-PAC PT "6 Clicks" Mobility   Outcome Measure  Help needed turning from your back to your side while in a flat bed without using bedrails?: A Little Help needed moving from lying on your back to sitting on the side of a flat bed without using bedrails?:  A Little Help needed moving to and from a bed to a chair (including a wheelchair)?: A Little Help needed standing up from a chair using your arms (e.g., wheelchair or bedside chair)?: A Little Help needed to walk in hospital room?: A Little Help needed climbing 3-5 steps with a railing? : A Little 6 Click Score: 18    End of Session Equipment Utilized During Treatment: Gait belt Activity Tolerance: Patient tolerated treatment well Patient left: in chair;with call bell/phone within reach;with family/visitor present   PT Visit Diagnosis: Other abnormalities of gait and mobility (R26.89)     Time: [id number] PT Time Calculation (min) (ACUTE ONLY): 8 min  Charges:  $Gait Training: 8-22  mins $Therapeutic Exercise: 8-22 mins                         Doreatha Massed, PT Acute Rehabilitation  Office: [phone] Pager: [phone]

## 2021-03-08 ENCOUNTER — Encounter (HOSPITAL_COMMUNITY): Payer: Self-pay | Admitting: Orthopaedic Surgery

## 2021-03-08 DIAGNOSIS — Z4789 Encounter for other orthopedic aftercare: Secondary | ICD-10-CM | POA: Diagnosis not present

## 2021-03-08 DIAGNOSIS — M8580 Other specified disorders of bone density and structure, unspecified site: Secondary | ICD-10-CM | POA: Diagnosis not present

## 2021-03-08 DIAGNOSIS — Z471 Aftercare following joint replacement surgery: Secondary | ICD-10-CM | POA: Diagnosis not present

## 2021-03-08 DIAGNOSIS — G5601 Carpal tunnel syndrome, right upper limb: Secondary | ICD-10-CM | POA: Diagnosis not present

## 2021-03-08 DIAGNOSIS — Z96641 Presence of right artificial hip joint: Secondary | ICD-10-CM | POA: Diagnosis not present

## 2021-03-08 DIAGNOSIS — E7800 Pure hypercholesterolemia, unspecified: Secondary | ICD-10-CM | POA: Diagnosis not present

## 2021-03-08 DIAGNOSIS — D1803 Hemangioma of intra-abdominal structures: Secondary | ICD-10-CM | POA: Diagnosis not present

## 2021-03-08 DIAGNOSIS — Z9181 History of falling: Secondary | ICD-10-CM | POA: Diagnosis not present

## 2021-03-08 DIAGNOSIS — H8110 Benign paroxysmal vertigo, unspecified ear: Secondary | ICD-10-CM | POA: Diagnosis not present

## 2021-03-08 DIAGNOSIS — I10 Essential (primary) hypertension: Secondary | ICD-10-CM | POA: Diagnosis not present

## 2021-03-08 NOTE — Discharge Summary (Signed)
Patient ID: Jocelyn Morgan MRN: [id number] DOB/AGE: 1951-03-15 70 y.o.  Admit date: 03/05/2021 Discharge date: 03/07/2021  Admission Diagnoses:  Principal Problem:   Unilateral primary osteoarthritis, right hip Active Problems:   Status post total replacement of right hip   Discharge Diagnoses:  Same  Past Medical History:  Diagnosis Date   Arthritis    osteoarthritis   Hyperlipidemia    Liver hemangioma 03/16/2011   Postmenopausal HRT (hormone replacement therapy)     Surgeries: Procedure(s): RIGHT TOTAL HIP ARTHROPLASTY ANTERIOR APPROACH on 03/05/2021   Consultants:   Discharged Condition: Improved  Hospital Course: Jocelyn Morgan is an 70 y.o. female who was admitted 03/05/2021 for operative treatment ofUnilateral primary osteoarthritis, right hip. Patient has severe unremitting pain that affects sleep, daily activities, and work/hobbies. After pre-op clearance the patient was taken to the operating room on 03/05/2021 and underwent  Procedure(s): RIGHT TOTAL HIP ARTHROPLASTY ANTERIOR APPROACH.    Patient was given perioperative antibiotics:  Anti-infectives (From admission, onward)    Start     Dose/Rate Route Frequency Ordered Stop   03/05/21 1700  ceFAZolin (ANCEF) IVPB 1 g/50 mL premix        1 g 100 mL/hr over 30 Minutes Intravenous Every 6 hours 03/05/21 1454 03/06/21 0700   03/05/21 0845  ceFAZolin (ANCEF) IVPB 2g/100 mL premix        2 g 200 mL/hr over 30 Minutes Intravenous On call to O.R. 03/05/21 [id number] 03/05/21 1117        Patient was given sequential compression devices, early ambulation, and chemoprophylaxis to prevent DVT.  Patient benefited maximally from hospital stay and there were no complications.    Recent vital signs: Patient Vitals for the past 24 hrs:  BP Temp Temp src Pulse Resp SpO2  03/07/21 1338 (!) 113/49 98.1 F (36.7 C) Oral 98 18 100 %     Recent laboratory studies:  Recent Labs    03/06/21 0326 03/07/21 0304  WBC 13.1*  10.7*  HGB 10.8* 10.3*  HCT 32.5* 31.1*  PLT 278 232  NA 127*  --   K 3.1*  --   CL 95*  --   CO2 24  --   BUN 7*  --   CREATININE 0.50  --   GLUCOSE 131*  --   CALCIUM 7.6*  --      Discharge Medications:   Allergies as of 03/07/2021       Reactions   Demerol [meperidine Hcl] Nausea And Vomiting   In combination with versed caused severe n/v and headaches    Versed [midazolam] Nausea And Vomiting   In combination with demerol caused severe n/v and headaches         Medication List     TAKE these medications    acetaminophen 650 MG CR tablet Commonly known as: TYLENOL Take 650 mg by mouth every 8 (eight) hours as needed for pain.   alendronate 70 MG tablet Commonly known as: FOSAMAX TAKE ONE TABLET BY MOUTH ONCE WEEKLY ON AN EMPTY STOMACH BEFORE BREAKFAST, REMAIN UPRIGHT FOR 30 MINUTES AND TAKE WITH 8 OUNCES OF WATER What changed: See the new instructions.   CALCIUM 600 + D PO Take 1 tablet by mouth in the morning and at bedtime.   cholecalciferol 25 MCG (1000 UNIT) tablet Commonly known as: VITAMIN D3 Take 1,000 Units by mouth daily.   EPINEPHrine 0.3 mg/0.3 mL Soaj injection Commonly known as: EPI-PEN Inject 0.3 mLs (0.3 mg total) into the muscle as  needed for anaphylaxis.   JOINT HEALTH PO Take 1 tablet by mouth daily.   Melatonin 3 MG Caps Take 3 mg by mouth at bedtime as needed (sleep).   methocarbamol 500 MG tablet Commonly known as: ROBAXIN Take 1 tablet (500 mg total) by mouth every 6 (six) hours as needed for muscle spasms.   multivitamin with minerals Tabs tablet Take 1 tablet by mouth daily.   naproxen sodium 220 MG tablet Commonly known as: ALEVE Take 220 mg by mouth 2 (two) times daily as needed (pain).   ondansetron 4 MG disintegrating tablet Commonly known as: Zofran ODT Take 1 tablet (4 mg total) by mouth every 8 (eight) hours as needed for nausea or vomiting.   pravastatin 20 MG tablet Commonly known as: PRAVACHOL TAKE ONE  TABLET BY MOUTH DAILY   traMADol 50 MG tablet Commonly known as: ULTRAM Take 1 tablet (50 mg total) by mouth every 6 (six) hours as needed for moderate pain.        Diagnostic Studies: DG Pelvis Portable  Result Date: 03/05/2021 CLINICAL DATA:  Status post right hip arthroplasty EXAM: PORTABLE PELVIS 1-2 VIEWS COMPARISON:  Preoperative radiograph 02/13/2021 FINDINGS: Right hip arthroplasty in expected alignment. No periprosthetic lucency or fracture. Recent postsurgical change includes air and edema in the soft tissue and musculature as well as lateral skin staples. IMPRESSION: Right hip arthroplasty without immediate postoperative complication. Electronically Signed   By: Keith Rake M.D.   On: 03/05/2021 14:54   DG C-Arm 1-60 Min-No Report  Result Date: 03/05/2021 CLINICAL DATA:  Right hip arthroplasty EXAM: OPERATIVE RIGHT HIP (WITH PELVIS IF PERFORMED) INTRAOPERATIVE VIEWS TECHNIQUE: Fluoroscopic spot image(s) were submitted for interpretation post-operatively. COMPARISON:  12/14/2020 FINDINGS: 6 C-arm fluoroscopic images were obtained intraoperatively and submitted for post operative interpretation. Images demonstrate subsequent placement of right total hip arthroplasty hardware without apparent complication. Please see the performing provider's procedural report for further detail. IMPRESSION: As above. Electronically Signed   By: Davina Poke D.O.   On: 03/05/2021 14:27   DG HIP OPERATIVE UNILAT W OR W/O PELVIS RIGHT  Result Date: 03/05/2021 CLINICAL DATA:  Right hip arthroplasty EXAM: OPERATIVE RIGHT HIP (WITH PELVIS IF PERFORMED) INTRAOPERATIVE VIEWS TECHNIQUE: Fluoroscopic spot image(s) were submitted for interpretation post-operatively. COMPARISON:  12/14/2020 FINDINGS: 6 C-arm fluoroscopic images were obtained intraoperatively and submitted for post operative interpretation. Images demonstrate subsequent placement of right total hip arthroplasty hardware without apparent  complication. Please see the performing provider's procedural report for further detail. IMPRESSION: As above. Electronically Signed   By: Davina Poke D.O.   On: 03/05/2021 14:27    Disposition: Discharge disposition: 01-Home or Self Care       Discharge Instructions     Call MD / Call 911   Complete by: As directed    If you experience chest pain or shortness of breath, CALL 911 and be transported to the hospital emergency room.  If you develope a fever above 101 F, pus (white drainage) or increased drainage or redness at the wound, or calf pain, call your surgeon's office.   Constipation Prevention   Complete by: As directed    Drink plenty of fluids.  Prune juice may be helpful.  You may use a stool softener, such as Colace (over the counter) 100 mg twice a day.  Use MiraLax (over the counter) for constipation as needed.   Diet - low sodium heart healthy   Complete by: As directed    Increase activity slowly as tolerated  Complete by: As directed    Post-operative opioid taper instructions:   Complete by: As directed    POST-OPERATIVE OPIOID TAPER INSTRUCTIONS: It is important to wean off of your opioid medication as soon as possible. If you do not need pain medication after your surgery it is ok to stop day one. Opioids include: Codeine, Hydrocodone(Norco, Vicodin), Oxycodone(Percocet, oxycontin) and hydromorphone amongst others.  Long term and even short term use of opiods can cause: Increased pain response Dependence Constipation Depression Respiratory depression And more.  Withdrawal symptoms can include Flu like symptoms Nausea, vomiting And more Techniques to manage these symptoms Hydrate well Eat regular healthy meals Stay active Use relaxation techniques(deep breathing, meditating, yoga) Do Not substitute Alcohol to help with tapering If you have been on opioids for less than two weeks and do not have pain than it is ok to stop all together.  Plan to wean  off of opioids This plan should start within one week post op of your joint replacement. Maintain the same interval or time between taking each dose and first decrease the dose.  Cut the total daily intake of opioids by one tablet each day Next start to increase the time between doses. The last dose that should be eliminated is the evening dose.           Follow-up Information     Mcarthur Rossetti, MD Follow up in 2 week(s).   Specialty: Orthopedic Surgery Contact information: [place] [address] [place], [place] Follow up.   Specialty: [place] Why: to provide home health physical therapy Contact information: [place] [address] [phone]                  Signed: Mcarthur Rossetti 03/08/2021, 11:37 AM

## 2021-03-09 ENCOUNTER — Encounter (HOSPITAL_COMMUNITY): Payer: Self-pay | Admitting: Orthopaedic Surgery

## 2021-03-09 NOTE — Addendum Note (Signed)
Addendum  created 03/09/21 1026 by Janeece Riggers, MD   Attestation recorded in [place], [place] accepted, [place] Attestations filed

## 2021-03-10 ENCOUNTER — Telehealth: Payer: Self-pay

## 2021-03-10 DIAGNOSIS — M8580 Other specified disorders of bone density and structure, unspecified site: Secondary | ICD-10-CM | POA: Diagnosis not present

## 2021-03-10 DIAGNOSIS — Z9181 History of falling: Secondary | ICD-10-CM | POA: Diagnosis not present

## 2021-03-10 DIAGNOSIS — G5601 Carpal tunnel syndrome, right upper limb: Secondary | ICD-10-CM | POA: Diagnosis not present

## 2021-03-10 DIAGNOSIS — Z96641 Presence of right artificial hip joint: Secondary | ICD-10-CM | POA: Diagnosis not present

## 2021-03-10 DIAGNOSIS — D1803 Hemangioma of intra-abdominal structures: Secondary | ICD-10-CM | POA: Diagnosis not present

## 2021-03-10 DIAGNOSIS — E7800 Pure hypercholesterolemia, unspecified: Secondary | ICD-10-CM | POA: Diagnosis not present

## 2021-03-10 DIAGNOSIS — Z471 Aftercare following joint replacement surgery: Secondary | ICD-10-CM | POA: Diagnosis not present

## 2021-03-10 DIAGNOSIS — H8110 Benign paroxysmal vertigo, unspecified ear: Secondary | ICD-10-CM | POA: Diagnosis not present

## 2021-03-10 DIAGNOSIS — I10 Essential (primary) hypertension: Secondary | ICD-10-CM | POA: Diagnosis not present

## 2021-03-10 NOTE — Telephone Encounter (Signed)
Patient left voicemail on triage phone. She states her right leg is longer than left. Would like a CB to discuss.  S/P  Right THA-03/05/2021   CB [phone]

## 2021-03-11 DIAGNOSIS — Z9181 History of falling: Secondary | ICD-10-CM | POA: Diagnosis not present

## 2021-03-11 DIAGNOSIS — Z471 Aftercare following joint replacement surgery: Secondary | ICD-10-CM | POA: Diagnosis not present

## 2021-03-11 DIAGNOSIS — M8580 Other specified disorders of bone density and structure, unspecified site: Secondary | ICD-10-CM | POA: Diagnosis not present

## 2021-03-11 DIAGNOSIS — D1803 Hemangioma of intra-abdominal structures: Secondary | ICD-10-CM | POA: Diagnosis not present

## 2021-03-11 DIAGNOSIS — I10 Essential (primary) hypertension: Secondary | ICD-10-CM | POA: Diagnosis not present

## 2021-03-11 DIAGNOSIS — Z96641 Presence of right artificial hip joint: Secondary | ICD-10-CM | POA: Diagnosis not present

## 2021-03-11 DIAGNOSIS — H8110 Benign paroxysmal vertigo, unspecified ear: Secondary | ICD-10-CM | POA: Diagnosis not present

## 2021-03-11 DIAGNOSIS — G5601 Carpal tunnel syndrome, right upper limb: Secondary | ICD-10-CM | POA: Diagnosis not present

## 2021-03-11 DIAGNOSIS — E7800 Pure hypercholesterolemia, unspecified: Secondary | ICD-10-CM | POA: Diagnosis not present

## 2021-03-15 DIAGNOSIS — Z9181 History of falling: Secondary | ICD-10-CM | POA: Diagnosis not present

## 2021-03-15 DIAGNOSIS — H8110 Benign paroxysmal vertigo, unspecified ear: Secondary | ICD-10-CM | POA: Diagnosis not present

## 2021-03-15 DIAGNOSIS — Z471 Aftercare following joint replacement surgery: Secondary | ICD-10-CM | POA: Diagnosis not present

## 2021-03-15 DIAGNOSIS — G5601 Carpal tunnel syndrome, right upper limb: Secondary | ICD-10-CM | POA: Diagnosis not present

## 2021-03-15 DIAGNOSIS — E7800 Pure hypercholesterolemia, unspecified: Secondary | ICD-10-CM | POA: Diagnosis not present

## 2021-03-15 DIAGNOSIS — Z96641 Presence of right artificial hip joint: Secondary | ICD-10-CM | POA: Diagnosis not present

## 2021-03-15 DIAGNOSIS — I10 Essential (primary) hypertension: Secondary | ICD-10-CM | POA: Diagnosis not present

## 2021-03-15 DIAGNOSIS — D1803 Hemangioma of intra-abdominal structures: Secondary | ICD-10-CM | POA: Diagnosis not present

## 2021-03-15 DIAGNOSIS — M8580 Other specified disorders of bone density and structure, unspecified site: Secondary | ICD-10-CM | POA: Diagnosis not present

## 2021-03-17 DIAGNOSIS — Z471 Aftercare following joint replacement surgery: Secondary | ICD-10-CM | POA: Diagnosis not present

## 2021-03-17 DIAGNOSIS — G5601 Carpal tunnel syndrome, right upper limb: Secondary | ICD-10-CM | POA: Diagnosis not present

## 2021-03-17 DIAGNOSIS — E7800 Pure hypercholesterolemia, unspecified: Secondary | ICD-10-CM | POA: Diagnosis not present

## 2021-03-17 DIAGNOSIS — Z9181 History of falling: Secondary | ICD-10-CM | POA: Diagnosis not present

## 2021-03-17 DIAGNOSIS — D1803 Hemangioma of intra-abdominal structures: Secondary | ICD-10-CM | POA: Diagnosis not present

## 2021-03-17 DIAGNOSIS — I10 Essential (primary) hypertension: Secondary | ICD-10-CM | POA: Diagnosis not present

## 2021-03-17 DIAGNOSIS — H8110 Benign paroxysmal vertigo, unspecified ear: Secondary | ICD-10-CM | POA: Diagnosis not present

## 2021-03-17 DIAGNOSIS — M8580 Other specified disorders of bone density and structure, unspecified site: Secondary | ICD-10-CM | POA: Diagnosis not present

## 2021-03-17 DIAGNOSIS — Z96641 Presence of right artificial hip joint: Secondary | ICD-10-CM | POA: Diagnosis not present

## 2021-03-18 ENCOUNTER — Ambulatory Visit (INDEPENDENT_AMBULATORY_CARE_PROVIDER_SITE_OTHER): Payer: Medicare HMO | Admitting: Orthopaedic Surgery

## 2021-03-18 ENCOUNTER — Encounter: Payer: Self-pay | Admitting: Orthopaedic Surgery

## 2021-03-18 DIAGNOSIS — Z96641 Presence of right artificial hip joint: Secondary | ICD-10-CM

## 2021-03-18 NOTE — Progress Notes (Signed)
The patient is 2 weeks status post a right total hip arthroplasty.  She is already walking without assistive device.  She is exercise quite a bit.  She is only taking Tylenol for pain.  She did feel like there was a slight leg length discrepancy so she has a gel insert she is wearing in her opposite left side.  I did have her lay in a supine position.  Her leg lengths to me feel equal but is something we will watch closely.  Her incision looks great.  The staples were removed and Steri-Strips applied.  There is no seroma and there is appropriate swelling.  She will continue increase her activities as comfort allows.  All questions and concerns were answered and addressed.  I would like to see her back in 4 weeks for repeat exam but no x-rays are needed.

## 2021-03-19 DIAGNOSIS — E7800 Pure hypercholesterolemia, unspecified: Secondary | ICD-10-CM | POA: Diagnosis not present

## 2021-03-19 DIAGNOSIS — M8580 Other specified disorders of bone density and structure, unspecified site: Secondary | ICD-10-CM | POA: Diagnosis not present

## 2021-03-19 DIAGNOSIS — Z96641 Presence of right artificial hip joint: Secondary | ICD-10-CM | POA: Diagnosis not present

## 2021-03-19 DIAGNOSIS — D1803 Hemangioma of intra-abdominal structures: Secondary | ICD-10-CM | POA: Diagnosis not present

## 2021-03-19 DIAGNOSIS — Z9181 History of falling: Secondary | ICD-10-CM | POA: Diagnosis not present

## 2021-03-19 DIAGNOSIS — G5601 Carpal tunnel syndrome, right upper limb: Secondary | ICD-10-CM | POA: Diagnosis not present

## 2021-03-19 DIAGNOSIS — Z471 Aftercare following joint replacement surgery: Secondary | ICD-10-CM | POA: Diagnosis not present

## 2021-03-19 DIAGNOSIS — I10 Essential (primary) hypertension: Secondary | ICD-10-CM | POA: Diagnosis not present

## 2021-03-19 DIAGNOSIS — H8110 Benign paroxysmal vertigo, unspecified ear: Secondary | ICD-10-CM | POA: Diagnosis not present

## 2021-03-24 DIAGNOSIS — G5601 Carpal tunnel syndrome, right upper limb: Secondary | ICD-10-CM | POA: Diagnosis not present

## 2021-03-24 DIAGNOSIS — Z9181 History of falling: Secondary | ICD-10-CM | POA: Diagnosis not present

## 2021-03-24 DIAGNOSIS — I10 Essential (primary) hypertension: Secondary | ICD-10-CM | POA: Diagnosis not present

## 2021-03-24 DIAGNOSIS — D1803 Hemangioma of intra-abdominal structures: Secondary | ICD-10-CM | POA: Diagnosis not present

## 2021-03-24 DIAGNOSIS — M8580 Other specified disorders of bone density and structure, unspecified site: Secondary | ICD-10-CM | POA: Diagnosis not present

## 2021-03-24 DIAGNOSIS — H8110 Benign paroxysmal vertigo, unspecified ear: Secondary | ICD-10-CM | POA: Diagnosis not present

## 2021-03-24 DIAGNOSIS — E7800 Pure hypercholesterolemia, unspecified: Secondary | ICD-10-CM | POA: Diagnosis not present

## 2021-03-24 DIAGNOSIS — Z96641 Presence of right artificial hip joint: Secondary | ICD-10-CM | POA: Diagnosis not present

## 2021-03-24 DIAGNOSIS — Z471 Aftercare following joint replacement surgery: Secondary | ICD-10-CM | POA: Diagnosis not present

## 2021-04-08 ENCOUNTER — Other Ambulatory Visit: Payer: Self-pay | Admitting: Family

## 2021-04-08 DIAGNOSIS — E7800 Pure hypercholesterolemia, unspecified: Secondary | ICD-10-CM

## 2021-04-09 ENCOUNTER — Ambulatory Visit (INDEPENDENT_AMBULATORY_CARE_PROVIDER_SITE_OTHER): Payer: Medicare HMO | Admitting: Family

## 2021-04-09 ENCOUNTER — Other Ambulatory Visit: Payer: Self-pay

## 2021-04-09 ENCOUNTER — Encounter: Payer: Self-pay | Admitting: Family

## 2021-04-09 VITALS — BP 110/70 | HR 98 | Temp 98.4°F | Ht 62.0 in | Wt 127.8 lb

## 2021-04-09 DIAGNOSIS — H8110 Benign paroxysmal vertigo, unspecified ear: Secondary | ICD-10-CM | POA: Diagnosis not present

## 2021-04-09 MED ORDER — MECLIZINE HCL 25 MG PO TABS: 25.0000 mg | ORAL_TABLET | Freq: Three times a day (TID) | ORAL | 0 refills | Status: AC | PRN

## 2021-04-09 NOTE — Patient Instructions (Signed)
Benign Positional Vertigo Vertigo is the feeling that you or your surroundings are moving when they are not. Benign positional vertigo is the most common form of vertigo. This is usually a harmless condition (benign). This condition is positional. This means that symptoms are triggered by certain movements and positions. This condition can be dangerous if it occurs while you are doing something that could cause harm to yourself or others. This includes activities such as driving or operating machinery. What are the causes? The inner ear has fluid-filled canals that help your brain sense movement and balance. When the fluid moves, the brain receives messages about your body's position. With benign positional vertigo, calcium crystals in the inner ear break free and disturb the inner ear area. This causes your brain to receive confusing messages about your body's position. What increases the risk? You are more likely to develop this condition if: You are a woman. You are 64 years of age or older. You have recently had a head injury. You have an inner ear disease. What are the signs or symptoms? Symptoms of this condition usually happen when you move your head or your eyes in different directions. Symptoms may start suddenly and usually last for less than a minute. They include: Loss of balance and falling. Feeling like you are spinning or moving. Feeling like your surroundings are spinning or moving. Nausea and vomiting. Blurred vision. Dizziness. Involuntary eye movement (nystagmus). Symptoms can be mild and cause only minor problems, or they can be severe and interfere with daily life. Episodes of benign positional vertigo may return (recur) over time. Symptoms may also improve over time. How is this diagnosed? This condition may be diagnosed based on: Your medical history. A physical exam of the head, neck, and ears. Positional tests to check for or stimulate vertigo. You may be asked to  turn your head and change positions, such as going from sitting to lying down. A health care provider will watch for symptoms of vertigo. You may be referred to a health care provider who specializes in ear, nose, and throat problems (ENT or otolaryngologist) or a provider who specializes in disorders of the nervous system (neurologist). How is this treated? This condition may be treated in a session in which your health care provider moves your head in specific positions to help the displaced crystals in your inner ear move. Treatment for this condition may take several sessions. Surgery may be needed in severe cases, but this is rare. In some cases, benign positional vertigo may resolve on its own in 2-4 weeks. Follow these instructions at home: Safety Move slowly. Avoid sudden body or head movements or certain positions, as told by your health care provider. Avoid driving or operating machinery until your health care provider says it is safe. Avoid doing any tasks that would be dangerous to you or others if vertigo occurs. If you have trouble walking or keeping your balance, try using a cane for stability. If you feel dizzy or unstable, sit down right away. Return to your normal activities as told by your health care provider. Ask your health care provider what activities are safe for you. General instructions Take over-the-counter and prescription medicines only as told by your health care provider. Drink enough fluid to keep your urine pale yellow. Keep all follow-up visits. This is important. Contact a health care provider if: You have a fever. Your condition gets worse or you develop new symptoms. Your family or friends notice any behavioral changes. You  have nausea or vomiting that gets worse. You have numbness or a prickling and tingling sensation. Get help right away if you: Have difficulty speaking or moving. Are always dizzy or faint. Develop severe headaches. Have weakness in  your legs or arms. Have changes in your hearing or vision. Develop a stiff neck. Develop sensitivity to light. These symptoms may represent a serious problem that is an emergency. Do not wait to see if the symptoms will go away. Get medical help right away. Call your local emergency services (911 in the [place] Do not drive yourself to the hospital. Summary Vertigo is the feeling that you or your surroundings are moving when they are not. Benign positional vertigo is the most common form of vertigo. This condition is caused by calcium crystals in the inner ear that become displaced. This causes a disturbance in an area of the inner ear that helps your brain sense movement and balance. Symptoms include loss of balance and falling, feeling that you or your surroundings are moving, nausea and vomiting, and blurred vision. This condition can be diagnosed based on symptoms, a physical exam, and positional tests. Follow safety instructions as told by your health care provider and keep all follow-up visits. This is important. This information is not intended to replace advice given to you by your health care provider. Make sure you discuss any questions you have with your health care provider. Document Revised: 06/10/2020 Document Reviewed: 06/10/2020 Elsevier Patient Education  2022 [employer].

## 2021-04-09 NOTE — Progress Notes (Signed)
Jocelyn Morgan is a 70 y.o. female with the following history as recorded in [employer]:  Patient Active Problem List   Diagnosis Date Noted   Status post total replacement of right hip 03/05/2021   Unilateral primary osteoarthritis, right hip 12/28/2020   Pelvic pain 12/14/2020   Chronic idiopathic urticaria 09/19/2019   Allergic urticaria 09/18/2018   Rhinitis 09/18/2018   Allergic reaction 09/18/2018   Angioedema 09/18/2018   Hip pain, bilateral 08/19/2014   Benign paroxysmal positional vertigo 04/23/2014   Atrophic vaginitis 04/23/2014   Neck pain 10/18/2013   Diverticulitis 10/18/2013   Abdominal pain, left lower quadrant 10/11/2013   Mass of mouth 10/15/2012   Atypical chest pain 12/26/2011   Liver hemangioma 03/16/2011   Osteopenia 11/17/2010   General medical examination 10/25/2010   Carpal tunnel syndrome of right wrist 10/25/2010   Low back pain, episodic 10/25/2010   VAGINITIS, ATROPHIC, POSTMENOPAUSAL 05/05/2009   OSTEOARTHRITIS 06/30/2008   HYPERCHOLESTEROLEMIA 04/03/2008   DECREASED HEARING, BILATERAL 04/03/2008    Current Outpatient Medications  Medication Sig Dispense Refill   acetaminophen (TYLENOL) 650 MG CR tablet Take 650 mg by mouth every 8 (eight) hours as needed for pain.     alendronate (FOSAMAX) 70 MG tablet TAKE ONE TABLET BY MOUTH ONCE WEEKLY ON AN EMPTY STOMACH BEFORE BREAKFAST, REMAIN UPRIGHT FOR 30 MINUTES AND TAKE WITH 8 OUNCES OF WATER (Patient taking differently: Take 70 mg by mouth every Wednesday.) 12 tablet 3   Calcium Carb-Cholecalciferol (CALCIUM 600 + D PO) Take 1 tablet by mouth in the morning and at bedtime.     cholecalciferol (VITAMIN D3) 25 MCG (1000 UNIT) tablet Take 1,000 Units by mouth daily.     EPINEPHrine 0.3 mg/0.3 mL IJ SOAJ injection Inject 0.3 mLs (0.3 mg total) into the muscle as needed for anaphylaxis. 2 each 1   meclizine (ANTIVERT) 25 MG tablet Take 1 tablet (25 mg total) by mouth 3 (three) times daily as needed for  dizziness. 30 tablet 0   Melatonin 3 MG CAPS Take 3 mg by mouth at bedtime as needed (sleep).     Misc Natural Products (JOINT HEALTH PO) Take 1 tablet by mouth daily.     Multiple Vitamin (MULTIVITAMIN WITH MINERALS) TABS tablet Take 1 tablet by mouth daily.     naproxen sodium (ALEVE) 220 MG tablet Take 220 mg by mouth 2 (two) times daily as needed (pain).     pravastatin (PRAVACHOL) 20 MG tablet TAKE ONE TABLET BY MOUTH DAILY 90 tablet 1   Current Facility-Administered Medications  Medication Dose Route Frequency Provider Last Rate Last Admin   omalizumab Arvid Right) injection 300 mg  300 mg Subcutaneous Q28 days Bobbitt, [name], MD   300 mg at 03/26/20 [id number]    Allergies: Demerol [meperidine hcl] and Versed [midazolam]  Past Medical History:  Diagnosis Date   Arthritis    osteoarthritis   Hyperlipidemia    Liver hemangioma 03/16/2011   Postmenopausal HRT (hormone replacement therapy)     Past Surgical History:  Procedure Laterality Date   EYE SURGERY     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   Procedure: RIGHT TOTAL HIP ARTHROPLASTY ANTERIOR APPROACH;  Surgeon: Mcarthur Rossetti, MD;  Location: [facility];  Service: Orthopedics;  Laterality: Right;   TUBAL LIGATION      Family History  Problem Relation Age of Onset   Ulcerative colitis Brother    Cancer Mother 35       breast   Allergic rhinitis Mother  Urticaria Mother    Arthritis Other    Diabetes Other    Cancer Other        bladder   Cancer Cousin 45       Stage 3 breast cancer   COPD Father        lived to be 12   Bladder Cancer Father     Social History   Tobacco Use   Smoking status: Never   Smokeless tobacco: Never  Substance Use Topics   Alcohol use: Yes    Alcohol/week: 1.0 - 2.0 standard drink    Types: 1 - 2 Glasses of wine per week    Comment: occas    Subjective:   Symptoms of dizziness x 4 days; "feels like room is spinning;" has been having some nausea; no chest pain or shortness of  breath or blurred vision; previous history of benign vertigo;  Recovering from hip replacement- scheduled for 6 week follow up next week;      Objective:  Vitals:   04/09/21 1027  BP: 110/70  Pulse: 98  Temp: 98.4 F (36.9 C)  TempSrc: Oral  SpO2: 98%  Weight: 127 lb 12.8 oz (58 kg)  Height: '5\' 2"'$  (1.575 m)    General: Well developed, well nourished, in no acute distress  Skin : Warm and dry.  Head: Normocephalic and atraumatic  Eyes: Sclera and conjunctiva clear; pupils round and reactive to light; extraocular movements intact  Ears: External normal; canals clear; tympanic membranes normal  Oropharynx: Pink, supple. No suspicious lesions  Neck: Supple without thyromegaly, adenopathy  Lungs: Respirations unlabored; clear to auscultation bilaterally without wheeze, rales, rhonchi  CVS exam: normal rate and regular rhythm.  Neurologic: Alert and oriented; speech intact; face symmetrical; moves all extremities well; CNII-XII intact without focal deficit   Assessment:  1. Benign paroxysmal positional vertigo, unspecified laterality     Plan:  Rx for Antivert 25 mg tid; she has Zofran at home to use prn; follow up worse, no better- to consider vestibular therapy if symptoms persist.   This visit occurred during the SARS-CoV-2 public health emergency.  Safety protocols were in place, including screening questions prior to the visit, additional usage of staff PPE, and extensive cleaning of exam room while observing appropriate contact time as indicated for disinfecting solutions.    No follow-ups on file.  No orders of the defined types were placed in this encounter.   Requested Prescriptions   Signed Prescriptions Disp Refills   meclizine (ANTIVERT) 25 MG tablet 30 tablet 0    Sig: Take 1 tablet (25 mg total) by mouth 3 (three) times daily as needed for dizziness.

## 2021-04-14 ENCOUNTER — Encounter: Payer: Self-pay | Admitting: Orthopaedic Surgery

## 2021-04-14 ENCOUNTER — Ambulatory Visit (INDEPENDENT_AMBULATORY_CARE_PROVIDER_SITE_OTHER): Payer: Medicare HMO | Admitting: Orthopaedic Surgery

## 2021-04-14 DIAGNOSIS — Z96641 Presence of right artificial hip joint: Secondary | ICD-10-CM

## 2021-04-14 NOTE — Progress Notes (Signed)
The patient comes in today at 6 weeks status post a right total hip arthroplasty.  She still reports stiffness and difficulty with getting her shoes on and off but overall has made great progress.  I did have her lay in a supine position to assess her leg lengths which are equal.  She feels like she is just a little bit longer on the right operative side so she is wearing a gel insert in her left shoe.  Overall she does have good mobility of both her hips.  Her right operative side is just a little stiff compared to the other side.  She did tell me that she is having more problems going up stairs and down stairs but overall is making progress.  I gave her reassurance that things are doing well for her.  She has no restrictions at this point.  We will see her back in 6 months with a low AP pelvis standing and a lateral of the right operative hip.  If there are issues before then she will let us know.

## 2021-04-15 ENCOUNTER — Encounter: Payer: Self-pay | Admitting: Orthopaedic Surgery

## 2021-04-28 DIAGNOSIS — H5005 Alternating esotropia: Secondary | ICD-10-CM | POA: Diagnosis not present

## 2021-04-28 DIAGNOSIS — H52223 Regular astigmatism, bilateral: Secondary | ICD-10-CM | POA: Diagnosis not present

## 2021-04-28 DIAGNOSIS — H5203 Hypermetropia, bilateral: Secondary | ICD-10-CM | POA: Diagnosis not present

## 2021-04-28 DIAGNOSIS — H524 Presbyopia: Secondary | ICD-10-CM | POA: Diagnosis not present

## 2021-05-06 ENCOUNTER — Encounter: Payer: Self-pay | Admitting: Family

## 2021-05-06 ENCOUNTER — Other Ambulatory Visit: Payer: Self-pay | Admitting: Family

## 2021-05-06 DIAGNOSIS — R42 Dizziness and giddiness: Secondary | ICD-10-CM

## 2021-05-07 ENCOUNTER — Emergency Department (HOSPITAL_BASED_OUTPATIENT_CLINIC_OR_DEPARTMENT_OTHER): Payer: Medicare HMO

## 2021-05-07 ENCOUNTER — Telehealth: Payer: Self-pay | Admitting: Family

## 2021-05-07 ENCOUNTER — Encounter (HOSPITAL_BASED_OUTPATIENT_CLINIC_OR_DEPARTMENT_OTHER): Payer: Self-pay

## 2021-05-07 ENCOUNTER — Other Ambulatory Visit: Payer: Self-pay

## 2021-05-07 ENCOUNTER — Other Ambulatory Visit (HOSPITAL_BASED_OUTPATIENT_CLINIC_OR_DEPARTMENT_OTHER): Payer: Self-pay

## 2021-05-07 ENCOUNTER — Emergency Department (HOSPITAL_BASED_OUTPATIENT_CLINIC_OR_DEPARTMENT_OTHER)
Admission: EM | Admit: 2021-05-07 | Discharge: 2021-05-07 | Disposition: A | Discharge status: Home / Self Care | Payer: Medicare HMO | Attending: Emergency Medicine | Admitting: Emergency Medicine

## 2021-05-07 DIAGNOSIS — K529 Noninfective gastroenteritis and colitis, unspecified: Secondary | ICD-10-CM | POA: Diagnosis not present

## 2021-05-07 DIAGNOSIS — Z96641 Presence of right artificial hip joint: Secondary | ICD-10-CM | POA: Diagnosis not present

## 2021-05-07 DIAGNOSIS — R1032 Left lower quadrant pain: Secondary | ICD-10-CM | POA: Diagnosis not present

## 2021-05-07 DIAGNOSIS — K922 Gastrointestinal hemorrhage, unspecified: Secondary | ICD-10-CM | POA: Diagnosis not present

## 2021-05-07 LAB — CBC WITH DIFFERENTIAL/PLATELET
Abs Immature Granulocytes: 0.03 10*3/uL (ref 0.00–0.07)
Basophils Absolute: 0 10*3/uL (ref 0.0–0.1)
Basophils Relative: 1 %
Eosinophils Absolute: 0 10*3/uL (ref 0.0–0.5)
Eosinophils Relative: 0 %
HCT: 42.2 % (ref 36.0–46.0)
Hemoglobin: 14 g/dL (ref 12.0–15.0)
Immature Granulocytes: 0 %
Lymphocytes Relative: 15 %
Lymphs Abs: 1.1 10*3/uL (ref 0.7–4.0)
MCH: 30.8 pg (ref 26.0–34.0)
MCHC: 33.2 g/dL (ref 30.0–36.0)
MCV: 92.7 fL (ref 80.0–100.0)
Monocytes Absolute: 0.4 10*3/uL (ref 0.1–1.0)
Monocytes Relative: 5 %
Neutro Abs: 6 10*3/uL (ref 1.7–7.7)
Neutrophils Relative %: 79 %
Platelets: 362 10*3/uL (ref 150–400)
RBC: 4.55 MIL/uL (ref 3.87–5.11)
RDW: 12.7 % (ref 11.5–15.5)
WBC: 7.6 10*3/uL (ref 4.0–10.5)
nRBC: 0 % (ref 0.0–0.2)

## 2021-05-07 LAB — PROTIME-INR
INR: 1 (ref 0.8–1.2)
Prothrombin Time: 13.4 seconds (ref 11.4–15.2)

## 2021-05-07 LAB — COMPREHENSIVE METABOLIC PANEL
ALT: 16 U/L (ref 0–44)
AST: 20 U/L (ref 15–41)
Albumin: 4.9 g/dL (ref 3.5–5.0)
Alkaline Phosphatase: 44 U/L (ref 38–126)
Anion gap: 9 (ref 5–15)
BUN: 15 mg/dL (ref 8–23)
CO2: 26 mmol/L (ref 22–32)
Calcium: 9.8 mg/dL (ref 8.9–10.3)
Chloride: 105 mmol/L (ref 98–111)
Creatinine, Ser: 0.54 mg/dL (ref 0.44–1.00)
GFR, Estimated: >60 mL/min (ref >60)
Glucose, Bld: 115 mg/dL — ABNORMAL HIGH (ref 70–99)
Potassium: 3.7 mmol/L (ref 3.5–5.1)
Sodium: 140 mmol/L (ref 135–145)
Total Bilirubin: 0.6 mg/dL (ref 0.3–1.2)
Total Protein: 8 g/dL (ref 6.5–8.1)

## 2021-05-07 LAB — LACTIC ACID, PLASMA: Lactic Acid, Venous: 0.9 mmol/L (ref 0.5–1.9)

## 2021-05-07 MED ORDER — AZITHROMYCIN 250 MG PO TABS: 250.0000 mg | ORAL_TABLET | Freq: Every day | ORAL | 0 refills | Status: DC

## 2021-05-07 MED ORDER — IOHEXOL 300 MG/ML  SOLN
80.0000 mL | Freq: Once | INTRAMUSCULAR | Status: AC | PRN
  Administered 2021-05-07: 80 mL via INTRAVENOUS

## 2021-05-07 MED ORDER — AZITHROMYCIN 250 MG PO TABS
250.0000 mg | ORAL_TABLET | Freq: Every day | ORAL | 0 refills | Status: DC
  Filled 2021-05-07: qty 6, 5d supply, fill #0

## 2021-05-07 NOTE — Telephone Encounter (Signed)
Who Is Calling Patient / Member / Family / Caregiver Call Type Triage / Clinical Relationship To Patient Self Return Phone Number [phone] (Primary) Chief Complaint Diarrhea Reason for Call Symptomatic / Request for Health Information Initial Comment Caller states, has diarrhea, cramping and bloody mucus. Has had diverticulitis in the past. Translation No Nurse Assessment Nurse: Ysidro Evert, RN, [facility] Date/Time (Eastern Time): 05/07/2021 9:01:50 AM Confirm and document reason for call. If symptomatic, describe symptoms. ---Caller states she has diarrhea that started during the night. There is bloody mucous in the diarrhea as well. No fever  05/07/2021 9:06:18 AM Go to ED Now Yes Ysidro Evert, RN, [facility] - ED

## 2021-05-07 NOTE — ED Provider Notes (Signed)
[place] EMERGENCY DEPARTMENT Provider Note   CSN: [id number] Arrival date & time: 05/07/21  0867     History Chief Complaint  Patient presents with   GI Bleeding    Jocelyn Morgan is a 70 y.o. female presenting to the emergency department for evaluation of diarrhea starting today around 0200.  Patient reports that she woke with in the middle of the night needing to have a bowel movement and she reported it was loose.  Around 0600, she noticed there was some bloody mucus to her stool.  She mentions very mild left lower quadrant abdominal cramping intermittently.  She denies any nausea, vomiting, dysuria, hematuria, fever, vaginal discharge, or vaginal bleeding.  The patient mentions she has a history of diverticulitis, and this does feel similar.  Medical history includes vertigo, diverticulitis, HLD.  Patient had recent right hip replacement 2 months ago.  Other abdominal surgeries include a bilateral tubal ligation.  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   Allergic urticaria 09/18/2018   Rhinitis 09/18/2018   Allergic reaction 09/18/2018   Angioedema 09/18/2018   Hip pain, bilateral 08/19/2014   Benign paroxysmal positional vertigo 04/23/2014   Atrophic vaginitis 04/23/2014   Neck pain 10/18/2013   Diverticulitis 10/18/2013   Abdominal pain, left lower quadrant 10/11/2013   Mass of mouth 10/15/2012   Atypical chest pain 12/26/2011   Liver hemangioma 03/16/2011   Osteopenia 11/17/2010   General medical examination 10/25/2010   Carpal tunnel syndrome of right wrist 10/25/2010   Low  back pain, episodic 10/25/2010   VAGINITIS, ATROPHIC, POSTMENOPAUSAL 05/05/2009   OSTEOARTHRITIS 06/30/2008   HYPERCHOLESTEROLEMIA 04/03/2008   DECREASED HEARING, BILATERAL 04/03/2008    Past Surgical History:  Procedure Laterality Date   EYE SURGERY     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   Procedure: RIGHT TOTAL HIP ARTHROPLASTY ANTERIOR APPROACH;  Surgeon: Mcarthur Rossetti, MD;  Location: [facility];  Service: Orthopedics;  Laterality: Right;   TUBAL LIGATION       OB History   No obstetric history on file.     Family History  Problem Relation Age of Onset   Ulcerative colitis Brother    Cancer Mother 34       breast   Allergic rhinitis Mother    Urticaria Mother    Arthritis Other    Diabetes Other    Cancer Other        bladder   Cancer Cousin 37       Stage 3 breast cancer   COPD Father        lived to be 78   Bladder Cancer Father     Social History   Tobacco Use   Smoking status: Never   Smokeless tobacco: Never  Vaping Use   Vaping Use: Never used  Substance Use Topics   Alcohol use: Yes    Alcohol/week: 1.0 - 2.0 standard drink    Types: 1 - 2 Glasses of wine per week    Comment: occas   Drug use: No    Home Medications Prior to Admission medications  Medication Sig Start Date End Date Taking? Authorizing Provider  acetaminophen (TYLENOL) 650 MG CR tablet Take 650 mg by mouth every 8 (eight) hours as needed for pain.    [provider]  alendronate (FOSAMAX) 70 MG tablet TAKE ONE TABLET BY MOUTH ONCE WEEKLY ON AN EMPTY STOMACH BEFORE BREAKFAST, REMAIN UPRIGHT FOR 30 MINUTES AND TAKE WITH 8 OUNCES OF WATER Patient taking differently: Take 70 mg by mouth every Wednesday. 11/16/20   Debbrah Alar, NP  azithromycin (ZITHROMAX) 250 MG tablet Take first 2 tablets together on day one, then 1 tablet every day until finished. 05/07/21   Sherrell Puller, PA-C  Calcium Carb-Cholecalciferol (CALCIUM 600 + D PO) Take 1 tablet by mouth in the  morning and at bedtime.    [provider]  cholecalciferol (VITAMIN D3) 25 MCG (1000 UNIT) tablet Take 1,000 Units by mouth daily.    [provider]  EPINEPHrine 0.3 mg/0.3 mL IJ SOAJ injection Inject 0.3 mLs (0.3 mg total) into the muscle as needed for anaphylaxis. 09/19/19   Bobbitt, [name], MD  meclizine (ANTIVERT) 25 MG tablet Take 1 tablet (25 mg total) by mouth 3 (three) times daily as needed for dizziness. 04/09/21   Marrian Salvage, FNP  Melatonin 3 MG CAPS Take 3 mg by mouth at bedtime as needed (sleep).    [provider]  Misc Natural Products (JOINT HEALTH PO) Take 1 tablet by mouth daily.    [provider]  Multiple Vitamin (MULTIVITAMIN WITH MINERALS) TABS tablet Take 1 tablet by mouth daily.    [provider]  naproxen sodium (ALEVE) 220 MG tablet Take 220 mg by mouth 2 (two) times daily as needed (pain).    [provider]  pravastatin (PRAVACHOL) 20 MG tablet TAKE ONE TABLET BY MOUTH DAILY 04/08/21   Debbrah Alar, NP    Allergies    Demerol [meperidine hcl] and Versed [midazolam]  Review of Systems   Review of Systems  Constitutional:  Negative for chills, fatigue and fever.  HENT:  Negative for ear pain and sore throat.   Eyes:  Negative for pain and visual disturbance.  Respiratory:  Negative for cough and shortness of breath.   Cardiovascular:  Negative for chest pain and palpitations.  Gastrointestinal:  Positive for abdominal pain, blood in stool and diarrhea. Negative for abdominal distention, constipation, nausea, rectal pain and vomiting.  Genitourinary:  Negative for dysuria, frequency, hematuria, urgency, vaginal bleeding and vaginal discharge.  Musculoskeletal:  Negative for arthralgias and back pain.  Skin:  Negative for color change and rash.  Neurological:  Negative for seizures and syncope.  All other systems reviewed and are negative.  Physical Exam Updated Vital Signs BP (!)  149/79   Pulse 81   Temp 98.7 F (37.1 C) (Oral)   Resp 18   Ht 5\' 2"  (1.575 m)   Wt 59 kg   LMP 07/25/1998   SpO2 96%   BMI 23.78 kg/m   Physical Exam Vitals and nursing note reviewed.  Constitutional:      General: She is not in acute distress.    Appearance: Normal appearance. She is not ill-appearing or toxic-appearing.  HENT:     Head: Normocephalic and atraumatic.     Mouth/Throat:     Mouth: Mucous membranes are moist.  Eyes:     General: No scleral icterus.    Conjunctiva/sclera: Conjunctivae normal.  Cardiovascular:     Rate and Rhythm: Normal rate and regular rhythm.  Pulmonary:  Effort: Pulmonary effort is normal. No respiratory distress.     Breath sounds: Normal breath sounds. No wheezing.  Abdominal:     General: Abdomen is flat. Bowel sounds are normal.     Palpations: Abdomen is soft.     Tenderness: There is abdominal tenderness. There is no guarding or rebound.     Comments: Tender to palpation of the left lower quadrant.  No masses or hepatosplenomegaly palpated.  Normoactive bowel sounds.  No overlying skin changes, erythema, rashes, ecchymosis noted to the area.  Genitourinary:    Comments: Exam deferred Musculoskeletal:        General: No deformity.     Cervical back: Normal range of motion.  Skin:    General: Skin is warm and dry.  Neurological:     General: No focal deficit present.     Mental Status: She is alert. Mental status is at baseline.    ED Results / Procedures / Treatments   Labs (all labs ordered are listed, but only abnormal results are displayed) Labs Reviewed  COMPREHENSIVE METABOLIC PANEL - Abnormal; Notable for the following components:      Result Value   Glucose, Bld 115 (*)    All other components within normal limits  CBC WITH DIFFERENTIAL/PLATELET  PROTIME-INR  LACTIC ACID, PLASMA    EKG None  Radiology CT ABDOMEN PELVIS W CONTRAST  Result Date: 05/07/2021 CLINICAL DATA:  GI bleed EXAM: CT ABDOMEN AND  PELVIS WITH CONTRAST TECHNIQUE: Multidetector CT imaging of the abdomen and pelvis was performed using the standard protocol following bolus administration of intravenous contrast. CONTRAST:  45mL OMNIPAQUE IOHEXOL 300 MG/ML  SOLN COMPARISON:  2017 FINDINGS: Lower chest: No acute abnormality. Hepatobiliary: Multiple hepatic cysts are again identified. Gallbladder is unremarkable. No biliary dilatation. Pancreas: Unremarkable. Spleen: Unremarkable. Adrenals/Urinary Tract: Adrenals are unremarkable. Kidneys are unremarkable. Bladder is poorly distended. Stomach/Bowel: Stomach is within normal limits. Bowel is normal in caliber. Distal colonic diverticulosis. Possible wall thickening at the splenic flexure. Vascular/Lymphatic: Aortic atherosclerosis. No enlarged lymph nodes. Reproductive: Uterus and bilateral adnexa are unremarkable. Other: No free fluid.  Abdominal wall is unremarkable. Musculoskeletal: Sigmoid scoliosis. Degenerative changes. Right total hip arthroplasty with associated streak artifact. IMPRESSION: Colonic diverticulosis. Possible wall thickening at the splenic flexure. This may reflect underdistention as there is no oral contrast. Colitis including ischemic colitis is a consideration. Aortic atherosclerosis is present but there is no apparent significant origin stenosis of the SMA or IMA. Electronically Signed   By: Macy Mis M.D.   On: 05/07/2021 13:46    Procedures Procedures   Medications Ordered in ED Medications  iohexol (OMNIPAQUE) 300 MG/ML solution 80 mL (80 mLs Intravenous Contrast Given 05/07/21 1238)    ED Course  I have reviewed the triage vital signs and the nursing notes.  Pertinent labs & imaging results that were available during my care of the patient were reviewed by me and considered in my medical decision making (see chart for details).  Jocelyn Morgan presents emergency department for evaluation of bloody mucus stools.  Differential diagnosis includes but  is not limited to colitis, ischemic colitis, diverticulitis, viral gastroenteritis, hemorrhoids, GI bleed, or malignancy.  On physical exam, the patient is very mildly tender to the left lower quadrant without guarding or rebounding.  Rectal exam deferred.    I personally reviewed and interpreted the patient's labs and imaging. CMP shows mildly elevated glucose,Otherwise no electrolyte abnormality.  CBC shows normal white count, no leukocytosis or anemia seen.  PT/INR within normal limits.  Lactic acid 0.9.  CT abdomen pelvis shows multiple hepatic cyst identified in this reading as well as a 2017 reading.  Distal colonic diverticulosis.  Possible wall thickening at the splenic flexure.  Reading showed colitis, but also considered ischemic colitis is a consideration.  Lactic acid normal and patient's presentation and interview, not consistent with ischemic colitis.  I discussed this case with my attending physician who cosigned this note including patient's presenting symptoms, physical exam, and planned diagnostics and interventions. Attending physician stated agreement with plan or made changes to plan which were implemented.   Attending physician assessed patient at bedside.   I discussed lab and imaging findings with the patient and spouse.  Discussed that this is likely colitis and attending prescribed azithromycin for symptoms.  Strict return precautions given such as worsening abdominal pain, worsening bleeding, lightheadedness, dizziness, to please return to the nearest emergency department.  Discussed with her the importance of adhering to antibiotics and completing the course.  Patient agrees with plan.  Patient is stable being discharged home in good condition.   MDM Rules/Calculators/A&P  Final Clinical Impression(s) / ED Diagnoses Final diagnoses:  Colitis    Rx / DC Orders ED Discharge Orders          Ordered    azithromycin (ZITHROMAX) 250 MG tablet  Daily,   Status:   Discontinued        05/07/21 1534    azithromycin (ZITHROMAX) 250 MG tablet  Daily        05/07/21 1544             Sherrell Puller, PA-C 05/08/21 1539    Gareth Morgan, MD 05/12/21 2218

## 2021-05-07 NOTE — Telephone Encounter (Signed)
FYI  Patient was advised to go to ED

## 2021-05-07 NOTE — ED Notes (Signed)
Patient reports 4-5 bowel movements of bloody mucous this morning, not a large amount.  Also having cramping abdominal pain.

## 2021-05-07 NOTE — Telephone Encounter (Signed)
Pt. Called in and stated she was up all night with diarrhea and stomach pain, she is now having blood mucus in her stool. Sent pt to triage for further advice

## 2021-05-07 NOTE — ED Triage Notes (Signed)
Pt arrives ambulatory to ED with c/o abdominal pain and cramping starting early this morning pt reports she started passing bloody mucus around 530 or 6 this morning. Pt reports 3-4 episodes of passing bloody mucus. History of diverticulitis. Also states she has steady vertigo, was seen for It a month ago, not worsened or changed since bloody mucus.

## 2021-07-12 ENCOUNTER — Telehealth: Payer: Self-pay | Admitting: Family

## 2021-07-12 ENCOUNTER — Encounter: Payer: Self-pay | Admitting: Family

## 2021-07-12 ENCOUNTER — Ambulatory Visit (INDEPENDENT_AMBULATORY_CARE_PROVIDER_SITE_OTHER): Payer: Medicare HMO | Admitting: Family

## 2021-07-12 VITALS — BP 125/66 | HR 78 | Temp 98.4°F | Resp 16 | Ht 62.0 in | Wt 131.0 lb

## 2021-07-12 DIAGNOSIS — M19041 Primary osteoarthritis, right hand: Secondary | ICD-10-CM | POA: Diagnosis not present

## 2021-07-12 DIAGNOSIS — G5603 Carpal tunnel syndrome, bilateral upper limbs: Secondary | ICD-10-CM | POA: Diagnosis not present

## 2021-07-12 DIAGNOSIS — E7800 Pure hypercholesterolemia, unspecified: Secondary | ICD-10-CM | POA: Diagnosis not present

## 2021-07-12 DIAGNOSIS — M19042 Primary osteoarthritis, left hand: Secondary | ICD-10-CM | POA: Diagnosis not present

## 2021-07-12 DIAGNOSIS — D649 Anemia, unspecified: Secondary | ICD-10-CM

## 2021-07-12 DIAGNOSIS — M810 Age-related osteoporosis without current pathological fracture: Secondary | ICD-10-CM

## 2021-07-12 DIAGNOSIS — H8110 Benign paroxysmal vertigo, unspecified ear: Secondary | ICD-10-CM | POA: Diagnosis not present

## 2021-07-12 DIAGNOSIS — Z0000 Encounter for general adult medical examination without abnormal findings: Secondary | ICD-10-CM

## 2021-07-12 LAB — CBC WITH DIFFERENTIAL/PLATELET
Basophils Absolute: 0 10*3/uL (ref 0.0–0.1)
Basophils Relative: 0.7 % (ref 0.0–3.0)
Eosinophils Absolute: 0.2 10*3/uL (ref 0.0–0.7)
Eosinophils Relative: 3.2 % (ref 0.0–5.0)
HCT: 40.3 % (ref 36.0–46.0)
Hemoglobin: 13 g/dL (ref 12.0–15.0)
Lymphocytes Relative: 22.5 % (ref 12.0–46.0)
Lymphs Abs: 1.2 10*3/uL (ref 0.7–4.0)
MCHC: 32.4 g/dL (ref 30.0–36.0)
MCV: 92.7 fl (ref 78.0–100.0)
Monocytes Absolute: 0.4 10*3/uL (ref 0.1–1.0)
Monocytes Relative: 7.4 % (ref 3.0–12.0)
Neutro Abs: 3.6 10*3/uL (ref 1.4–7.7)
Neutrophils Relative %: 66.2 % (ref 43.0–77.0)
Platelets: 341 10*3/uL (ref 150.0–400.0)
RBC: 4.34 Mil/uL (ref 3.87–5.11)
RDW: 13.8 % (ref 11.5–15.5)
WBC: 5.4 10*3/uL (ref 4.0–10.5)

## 2021-07-12 LAB — COMPREHENSIVE METABOLIC PANEL
ALT: 17 U/L (ref 0–35)
AST: 18 U/L (ref 0–37)
Albumin: 4.5 g/dL (ref 3.5–5.2)
Alkaline Phosphatase: 39 U/L (ref 39–117)
BUN: 15 mg/dL (ref 6–23)
CO2: 29 mEq/L (ref 19–32)
Calcium: 9.7 mg/dL (ref 8.4–10.5)
Chloride: 103 mEq/L (ref 96–112)
Creatinine, Ser: 0.64 mg/dL (ref 0.40–1.20)
GFR: 88.85 mL/min (ref >60.00)
Glucose, Bld: 94 mg/dL (ref 70–99)
Potassium: 4.6 mEq/L (ref 3.5–5.1)
Sodium: 141 mEq/L (ref 135–145)
Total Bilirubin: 0.5 mg/dL (ref 0.2–1.2)
Total Protein: 6.8 g/dL (ref 6.0–8.3)

## 2021-07-12 LAB — LIPID PANEL
Cholesterol: 182 mg/dL (ref 0–200)
HDL: 52.7 mg/dL (ref >39.00)
LDL Cholesterol: 106 mg/dL — ABNORMAL HIGH (ref 0–99)
NonHDL: 129.7
Total CHOL/HDL Ratio: 3
Triglycerides: 118 mg/dL (ref 0.0–149.0)
VLDL: 23.6 mg/dL (ref 0.0–40.0)

## 2021-07-12 NOTE — Assessment & Plan Note (Addendum)
Continue healthy diet and regular exercise.  She plans to update her mammogram and bone density in late January.  She believes that her colonoscopy follow up was recommended for 10 yrs- will request report from her GI specialist.

## 2021-07-12 NOTE — Assessment & Plan Note (Signed)
She reports that her symptoms are mild currently. She opted not to do the [facility] Re[facility].  She has their number to contact if she changes her mind.

## 2021-07-12 NOTE — Assessment & Plan Note (Signed)
She has some arthritis pain at the bases of both thumbs.  Discussed tylenol prn. She will let me know if she decides she would like to see orthopedics.

## 2021-07-12 NOTE — Telephone Encounter (Signed)
Please call [facility] and request a copy of her last colonoscopy.

## 2021-07-12 NOTE — Assessment & Plan Note (Signed)
Recommended wrist splints, she will let me know if symptoms fail to improve and we could refer to orthopedics or neurology.

## 2021-07-12 NOTE — Patient Instructions (Signed)
Please complete lab work prior to leaving.   

## 2021-07-12 NOTE — Telephone Encounter (Signed)
Results received from patient and printed out for provider

## 2021-07-12 NOTE — Telephone Encounter (Signed)
Patient will call back with information about which GI group she was seen by in the past

## 2021-07-12 NOTE — Progress Notes (Signed)
Subjective:   By signing my name below, I, Shehryar Baig, attest that this documentation has been prepared under the direction and in the presence of Debbrah Alar NP. 07/12/2021    Patient ID: Jocelyn Morgan, female    DOB: Aug 30, 1950, 70 y.o.   MRN: [id number]  Chief Complaint  Patient presents with   Annual Exam    HPI Patient is in today for a comprehensive physical exam.   Thumb pain/numbness- She continues having joint pain in both her thumbs. She also continues having occasional numbness in all her fingers in both hands except her pinky fingers. Worse with certain movements, "like shampooing my hair. " She denies having any neck pain.  Vertigo- She reports being prescribed meclizine to manage her vertigo and found relief. She then found her symptoms returned shortly after she started taking it.  Cholesterol- She reports after her hip replacement procedure in August, 2022,  her cardiologist while recovering in the hospital recommend she take a lower does of her cholesterol medication. She is inquiring if she should switch her medication.  ER visit- She reports visiting the ER in October, 2022 for colitis. She was told if she was feeling fine she did not need to have a follow up with her PCP. She reports recovering well after her visit to the ER. No further symptoms.   She denies having any unexpected weight change, ear pain, hearing loss and rhinorrhea, visual disturbance, cough, chest pain and leg swelling, nausea, vomiting, diarrhea, constipation, blood in stool, or dysuria and frequency, for myalgias and arthralgias, rash, headaches, adenopathy, depression or anxiety at this time. Social history: She reports her brother recently suffered from a heart attack and has recovered well from it.  Immunizations:  Diet: She typically manages a healthy diet but is eating more sweets for the past 6 months.  Colonoscopy: Last completed 03/25/2016. Results showed no polyps. Repeat in 5  years.  Dexa: Last completed 07/03/2019. Results showed she is osteoporotic. Repeat in 2 years. Pap Smear: Last completed 04/23/2014.  Mammogram: Last completed 08/19/2020. Results are normal. Repeat in 1 year.  Dental: She is UTD on dental care.  Vision: She is UTD on vision care.    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   Procedure: RIGHT TOTAL HIP ARTHROPLASTY ANTERIOR APPROACH;  Surgeon: Mcarthur Rossetti, MD;  Location: [facility];  Service: Orthopedics;  Laterality: Right;   TUBAL LIGATION      Family History  Problem Relation Age of Onset   Cancer Mother 70       breast   Allergic rhinitis Mother    Urticaria Mother    COPD Father        lived to be 38   Bladder Cancer Father    Ulcerative colitis Brother    CAD Brother    Prostate cancer Brother    Cancer Cousin 70       Stage 3 breast cancer   Arthritis Other    Diabetes Other    Cancer Other        bladder  Social History   Socioeconomic History   Marital status: Married    Spouse name: Not on file   Number of children: 4   Years of education: Not on file   Highest education level: Not on file  Occupational History   Occupation: housewife  Tobacco Use   Smoking status: Never   Smokeless tobacco: Never  Vaping Use   Vaping Use: Never used  Substance and Sexual Activity   Alcohol use: Yes    Alcohol/week: 1.0 - 2.0 standard drink    Types: 1 - 2 Glasses of wine per week    Comment: occas   Drug use: No   Sexual activity: Yes  Other Topics Concern   Not on file  Social History Narrative   Exercise: sometimes   Caffeine:  2 cups 1/2 and 1/2 coffee daily   6 grandchildren (none of her children are in [place])   Married         Social Determinants of Radio broadcast assistant Strain: Not on file  Food Insecurity: Not on file  Transportation Needs: Not on file  Physical Activity: Not on file  Stress: Not on file  Social Connections: Not on file  Intimate Partner Violence: Not on file    Outpatient Medications Prior to Visit  Medication Sig Dispense Refill   acetaminophen (TYLENOL) 650 MG CR tablet Take 650 mg by mouth every 8 (eight) hours as needed for pain.     alendronate (FOSAMAX) 70 MG tablet TAKE ONE TABLET BY MOUTH ONCE WEEKLY ON AN EMPTY STOMACH BEFORE BREAKFAST, REMAIN UPRIGHT FOR 30 MINUTES AND TAKE WITH 8 OUNCES OF WATER (Patient taking differently: Take 70 mg by mouth every Wednesday.) 12 tablet 3   Calcium Carb-Cholecalciferol (CALCIUM 600 + D PO) Take 1 tablet by mouth in the morning and at bedtime.     cholecalciferol (VITAMIN D3) 25 MCG (1000 UNIT) tablet Take 1,000 Units by mouth daily.     EPINEPHrine 0.3 mg/0.3 mL IJ SOAJ injection Inject 0.3 mLs (0.3 mg total) into the muscle as needed for anaphylaxis. 2 each 1   meclizine (ANTIVERT) 25 MG tablet Take 1 tablet (25 mg total) by mouth 3 (three) times daily as needed for dizziness. 30 tablet 0   Melatonin 3 MG CAPS Take 3 mg by mouth at bedtime as needed (sleep).     Misc Natural Products (JOINT HEALTH PO) Take 1 tablet by mouth daily.     Multiple Vitamin (MULTIVITAMIN WITH MINERALS) TABS tablet Take 1 tablet by mouth daily.     pravastatin (PRAVACHOL) 20 MG tablet TAKE ONE TABLET BY MOUTH DAILY 90 tablet 1   azithromycin (ZITHROMAX) 250 MG tablet Take first 2 tablets together on day one, then 1 tablet every day until finished. 6 tablet 0   naproxen sodium (ALEVE) 220 MG tablet Take 220 mg by mouth 2 (two) times daily as needed (pain).     Facility-Administered Medications Prior to Visit  Medication Dose Route Frequency Provider Last Rate  Last Admin   omalizumab Arvid Right) injection 300 mg  300 mg Subcutaneous Q28 days Bobbitt, [name], MD   300 mg at 03/26/20 6144    Allergies  Allergen Reactions   Demerol [Meperidine Hcl] Nausea And Vomiting    In combination with versed caused severe n/v and headaches    Versed [Midazolam] Nausea And Vomiting    In combination with demerol caused severe n/v and headaches     Review of Systems  Constitutional:        (-)  unexpected weight change (-)Adenopathy  HENT:  Negative for hearing loss.        (-)Rhinorrhea   Eyes:        (-)Visual disturbance  Respiratory:  Negative for cough.   Cardiovascular:  Negative for chest pain and leg swelling.  Gastrointestinal:  Negative for blood in stool, constipation, diarrhea, nausea and vomiting.  Genitourinary:  Negative for dysuria and frequency.  Musculoskeletal:  Negative for joint pain and myalgias.       (+)pain in both 1st digit of both hands  Skin:  Negative for rash.  Neurological:  Positive for dizziness (Vertigo) and tingling (occasionaly on all digits of both hands except the 5th digit). Negative for headaches.  Psychiatric/Behavioral:  Negative for depression. The patient is not nervous/anxious.       Objective:    Physical Exam Constitutional:      General: She is not in acute distress.    Appearance: Normal appearance. She is not ill-appearing.  HENT:     Head: Normocephalic and atraumatic.     Right Ear: Tympanic membrane, ear canal and external ear normal.     Left Ear: Tympanic membrane, ear canal and external ear normal.  Eyes:     Extraocular Movements: Extraocular movements intact.     Right eye: No nystagmus.     Left eye: No nystagmus.     Pupils: Pupils are equal, round, and reactive to light.  Cardiovascular:     Rate and Rhythm: Normal rate and regular rhythm.     Heart sounds: Normal heart sounds. No murmur heard.   No gallop.  Pulmonary:     Effort: Pulmonary effort is normal. No respiratory  distress.     Breath sounds: Normal breath sounds. No wheezing or rales.  Abdominal:     General: There is no distension.     Palpations: Abdomen is soft.     Tenderness: There is no abdominal tenderness. There is no guarding.  Musculoskeletal:     Comments: 5/5 strength in both upper and lower extremities  Skin:    General: Skin is warm and dry.  Neurological:     Mental Status: She is alert and oriented to person, place, and time.     Deep Tendon Reflexes:     Reflex Scores:      Patellar reflexes are 2+ on the right side and 2+ on the left side. Psychiatric:        Behavior: Behavior normal.        Judgment: Judgment normal.    BP 125/66 (BP Location: Right Arm, Patient Position: Sitting, Cuff Size: Small)    Pulse 78    Temp 98.4 F (36.9 C) (Oral)    Resp 16    Ht 5\' 2"  (1.575 m)    Wt 131 lb (59.4 kg)    LMP 07/25/1998    SpO2 100%    BMI 23.96 kg/m  Wt Readings from Last 3 Encounters:  07/12/21 131 lb (59.4 kg)  05/07/21 130 lb (59 kg)  04/09/21 127 lb 12.8 oz (58 kg)       Assessment & Plan:   Problem List Items Addressed This Visit       Unprioritized   Preventative health care - Primary    Continue healthy diet and regular exercise.  She plans to update her mammogram and bone density in late January.  She believes that her colonoscopy follow up was recommended for 10 yrs- will request report from her GI specialist.  Relevant Orders   MM 3D SCREEN BREAST BILATERAL   Osteoarthritis    She has some arthritis pain at the bases of both thumbs.  Discussed tylenol prn. She will let me know if she decides she would like to see orthopedics.       Carpal tunnel syndrome, bilateral    Recommended wrist splints, she will let me know if symptoms fail to improve and we could refer to orthopedics or neurology.       Benign paroxysmal positional vertigo    She reports that her symptoms are mild currently. She opted not to do the Vestibular Rehab.  She has their  number to contact if she changes her mind.       Other Visit Diagnoses     Osteoporosis, unspecified osteoporosis type, unspecified pathological fracture presence       Relevant Orders   DG Bone Density        No orders of the defined types were placed in this encounter.   I, Debbrah Alar NP, personally preformed the services described in this documentation.  All medical record entries made by the scribe were at my direction and in my presence.  I have reviewed the chart and discharge instructions (if applicable) and agree that the record reflects my personal performance and is accurate and complete. 07/12/2021   I,Shehryar Baig,acting as a Education administrator for Nance Pear, NP.,have documented all relevant documentation on the behalf of Nance Pear, NP,as directed by  Nance Pear, NP while in the presence of Nance Pear, NP.   Nance Pear, NP

## 2021-07-12 NOTE — Addendum Note (Signed)
Addended by: Debbrah Alar on: 07/12/2021 08:44 AM   Modules accepted: Orders

## 2021-08-25 ENCOUNTER — Ambulatory Visit (INDEPENDENT_AMBULATORY_CARE_PROVIDER_SITE_OTHER): Payer: Medicare HMO

## 2021-08-25 ENCOUNTER — Other Ambulatory Visit: Payer: Self-pay

## 2021-08-25 DIAGNOSIS — M85852 Other specified disorders of bone density and structure, left thigh: Secondary | ICD-10-CM | POA: Diagnosis not present

## 2021-08-25 DIAGNOSIS — M810 Age-related osteoporosis without current pathological fracture: Secondary | ICD-10-CM | POA: Diagnosis not present

## 2021-08-25 DIAGNOSIS — Z0000 Encounter for general adult medical examination without abnormal findings: Secondary | ICD-10-CM

## 2021-08-25 DIAGNOSIS — Z1231 Encounter for screening mammogram for malignant neoplasm of breast: Secondary | ICD-10-CM | POA: Diagnosis not present

## 2021-08-25 DIAGNOSIS — M85832 Other specified disorders of bone density and structure, left forearm: Secondary | ICD-10-CM | POA: Diagnosis not present

## 2021-09-08 ENCOUNTER — Telehealth: Payer: Self-pay | Admitting: Family

## 2021-09-08 NOTE — Telephone Encounter (Signed)
Left message for patient to call back and schedule Medicare Annual Wellness Visit (AWV) in office.   If not able to come in office, please offer to do virtually or by telephone.  Left office number and my jabber [phone].  Last AWV:05/04/2016  Please schedule at anytime with Nurse Health Advisor.

## 2021-10-01 ENCOUNTER — Telehealth: Payer: Self-pay | Admitting: Family

## 2021-10-01 NOTE — Telephone Encounter (Signed)
Left message for patient to call back and schedule Medicare Annual Wellness Visit (AWV) in office.  ? ?If not able to come in office, please offer to do virtually or by telephone.  Left office number and my jabber [phone]. ? ?Last AWV:05/04/2016 ? ?Please schedule at anytime with Nurse Health Advisor. ?  ?

## 2021-10-05 NOTE — Progress Notes (Addendum)
? ?Subjective:  ? Jocelyn Morgan is a 71 y.o. female who presents for Medicare Annual (Subsequent) preventive examination. ? ?I connected with  Jocelyn Morgan on 10/06/21 by a audio enabled telemedicine application and verified that I am speaking with the correct person using two identifiers. ? ?Patient Location: Home ? ?Provider Location: Office/Clinic ? ?I discussed the limitations of evaluation and management by telemedicine. The patient expressed understanding and agreed to proceed.  ? ?Review of Systems    ? ?Cardiac Risk Factors include: dyslipidemia;advanced age (>38mn, >>80women) ? ?   ?Objective:  ?  ?Today's Vitals  ? 10/06/21 0908  ?PainSc: 2   ? ?There is no height or weight on file to calculate BMI. ? ?Advanced Directives 10/06/2021 05/07/2021 03/05/2021 03/05/2021 02/26/2021 09/16/2018 04/28/2015  ?Does Patient Have a Medical Advance Directive? Yes Yes Yes Yes Yes No No  ?Type of AParamed[place]of [place] will;Out of facility DNR (pink MOST or yellow form) HD[place]Living will HDri[place] SpringsLiving will HSouthern ShoresLiving will [place]Living will - -  ?Does patient want to make changes to medical advance directive? - No - Patient declined No - Patient declined - - - -  ?Copy of [place] Chart? No - copy requested No - copy requested Yes - validated most recent copy scanned in chart (See row information) Yes - validated most recent copy scanned in chart (See row information) - - -  ?Would patient like information on creating a medical advance directive? - - - - - No - Patient declined Yes - Educational materials given  ? ? ?Current Medications (verified) ?Outpatient Encounter Medications as of 10/06/2021  ?Medication Sig  ? acetaminophen (TYLENOL) 650 MG CR tablet Take 650 mg by mouth every 8 (eight) hours as needed for pain.  ? alendronate (FOSAMAX) 70 MG tablet TAKE ONE TABLET BY  MOUTH ONCE WEEKLY ON AN EMPTY STOMACH BEFORE BREAKFAST, REMAIN UPRIGHT FOR 30 MINUTES AND TAKE WITH 8 OUNCES OF WATER (Patient taking differently: Take 70 mg by mouth every Wednesday.)  ? Calcium Carb-Cholecalciferol (CALCIUM 600 + D PO) Take 1 tablet by mouth in the morning and at bedtime.  ? cholecalciferol (VITAMIN D3) 25 MCG (1000 UNIT) tablet Take 1,000 Units by mouth daily.  ? EPINEPHrine 0.3 mg/0.3 mL IJ SOAJ injection Inject 0.3 mLs (0.3 mg total) into the muscle as needed for anaphylaxis.  ? meclizine (ANTIVERT) 25 MG tablet Take 1 tablet (25 mg total) by mouth 3 (three) times daily as needed for dizziness.  ? Melatonin 3 MG CAPS Take 3 mg by mouth at bedtime as needed (sleep).  ? Misc Natural Products (JOINT HEALTH PO) Take 1 tablet by mouth daily.  ? Multiple Vitamin (MULTIVITAMIN WITH MINERALS) TABS tablet Take 1 tablet by mouth daily.  ? pravastatin (PRAVACHOL) 20 MG tablet TAKE ONE TABLET BY MOUTH DAILY  ? ?Facility-Administered Encounter Medications as of 10/06/2021  ?Medication  ? omalizumab (Arvid Right injection 300 mg  ? ? ?Allergies (verified) ?Demerol [meperidine hcl] and Versed [midazolam]  ? ?History: ?Past Medical History:  ?Diagnosis Date  ? Arthritis   ? osteoarthritis  ? Hyperlipidemia   ? Liver hemangioma 03/16/2011  ? Postmenopausal HRT (hormone replacement therapy)   ? ?Past Surgical History:  ?Procedure Laterality Date  ? EYE SURGERY    ? 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  ? Procedure: RIGHT TOTAL HIP ARTHROPLASTY ANTERIOR APPROACH;  Surgeon: BMcarthur Rossetti MD;  Location: [facility];  Service: Orthopedics;  Laterality: Right;  ? TUBAL LIGATION    ? ?Family History  ?Problem Relation Age of Onset  ? Cancer Mother 91  ?     breast  ? Allergic rhinitis Mother   ? Urticaria Mother   ? COPD Father   ?     lived to be 69  ? Bladder Cancer Father   ? Ulcerative colitis Brother   ? CAD Brother   ? Prostate cancer Brother   ? Cancer Cousin 16  ?     Stage 3 breast cancer  ? Arthritis  Other   ? Diabetes Other   ? Cancer Other   ?     bladder  ? ?Social History  ? ?Socioeconomic History  ? Marital status: Married  ?  Spouse name: Not on file  ? Number of children: 4  ? Years of education: Not on file  ? Highest education level: Not on file  ?Occupational History  ? Occupation: housewife  ?Tobacco Use  ? Smoking status: Never  ? Smokeless tobacco: Never  ?Vaping Use  ? Vaping Use: Never used  ?Substance and Sexual Activity  ? Alcohol use: Yes  ?  Alcohol/week: 1.0 - 2.0 standard drink  ?  Types: 1 - 2 Glasses of wine per week  ?  Comment: occas  ? Drug use: No  ? Sexual activity: Yes  ?Other Topics Concern  ? Not on file  ?Social History Narrative  ? Exercise: sometimes  ? Caffeine: 2 cups 1/2 and 1/2 coffee daily  ? 6 grandchildren (none of her children are in [place])  ? Married  ?   ?   ? ?Social Determinants of Health  ? ?Financial Resource Strain: Low Risk   ? Difficulty of Paying Living Expenses: Not hard at all  ?Food Insecurity: No Food Insecurity  ? Worried About Charity fundraiser in the Last Year: Never true  ? Ran Out of Food in the Last Year: Never true  ?Transportation Needs: No Transportation Needs  ? Lack of Transportation (Medical): No  ? Lack of Transportation (Non-Medical): No  ?Physical Activity: Not on file  ?Stress: No Stress Concern Present  ? Feeling of Stress : Not at all  ?Social Connections: Moderately Integrated  ? Frequency of Communication with Friends and Family: More than three times a week  ? Frequency of Social Gatherings with Friends and Family: More than three times a week  ? Attends Religious Services: More than 4 times per year  ? Active Member of Clubs or Organizations: No  ? Attends Archivist Meetings: Never  ? Marital Status: Married  ? ? ?Tobacco Counseling ?Counseling given: Not Answered ? ? ?Clinical Intake: ? ?Pre-visit preparation completed: Yes ? ?Pain : 0-10 ?Pain Score: 2  ?Pain Type: Chronic pain ?Pain Location: Wrist ?Pain Orientation:  Right ?Pain Descriptors / Indicators: Aching ? ?  ? ?Nutritional Risks: None ?Diabetes: No ? ?How often do you need to have someone help you when you read instructions, pamphlets, or other written materials from your doctor or pharmacy?: 1 - Never ? ?Diabetic?No ? ?Interpreter Needed?: No ? ?Information entered by :: Gwyn Mehring ? ? ?Activities of Daily Living ?In your present state of health, do you have any difficulty performing the following activities: 10/06/2021 03/05/2021  ?Hearing? Y Y  ?Comment hearing aids -  ?Vision? Y N  ?Difficulty concentrating or making decisions? N N  ?Walking or climbing stairs? N N  ?  Dressing or bathing? N N  ?Doing errands, shopping? N N  ?Preparing Food and eating ? N -  ?Using the Toilet? N -  ?In the past six months, have you accidently leaked urine? N -  ?Do you have problems with loss of bowel control? N -  ?Managing your Medications? N -  ?Managing your Finances? N -  ?Housekeeping or managing your Housekeeping? N -  ?Some recent data might be hidden  ? ? ?Patient Care Team: ?Debbrah Alar, NP as PCP - General ?Dene Gentry, MD as Consulting Physician (Sports Medicine) ? ?Indicate any recent Medical Services you may have received from other than [facility] providers in the past year (date may be approximate). ? ?   ?Assessment:  ? This is a routine wellness examination for [facility]. ? ?Hearing/Vision screen ?No results found. ? ?Dietary issues and exercise activities discussed: ?Current Exercise Habits: Home exercise routine, Type of exercise: walking, Time (Minutes): 60, Frequency (Times/Week): 7, Weekly Exercise (Minutes/Week): 420, Intensity: Mild, Exercise limited by: None identified ? ? Goals Addressed   ? ?  ?  ?  ?  ? This Visit's Progress  ?  Patient Stated     ?  Manage health ?  ? ?  ? ?Depression Screen ?PHQ 2/9 Scores 10/06/2021 04/09/2021 12/14/2020 07/08/2020 06/25/2019 05/16/2018 05/10/2017  ?PHQ - 2 Score 0 0 0 0 0 0 0  ?PHQ- 9 Score - - - - - 3 4  ?  ?Fall  Risk ?Fall Risk  10/06/2021 04/09/2021 12/14/2020 07/08/2020 05/16/2018  ?Falls in the past year? 0 0 0 0 No  ?Number falls in past yr: 0 0 0 0 -  ?Injury with Fall? 0 0 0 0 -  ?Risk for fall due to : No Fall Risk

## 2021-10-06 ENCOUNTER — Telehealth: Payer: Self-pay

## 2021-10-06 ENCOUNTER — Ambulatory Visit (INDEPENDENT_AMBULATORY_CARE_PROVIDER_SITE_OTHER): Payer: Medicare HMO

## 2021-10-06 DIAGNOSIS — Z0000 Encounter for general adult medical examination without abnormal findings: Secondary | ICD-10-CM

## 2021-10-06 DIAGNOSIS — L989 Disorder of the skin and subcutaneous tissue, unspecified: Secondary | ICD-10-CM

## 2021-10-06 NOTE — Telephone Encounter (Signed)
Pt would like a referral placed for dermatology for a spot on her face that is changing in size. ?

## 2021-10-06 NOTE — Patient Instructions (Signed)
Jocelyn Morgan , ?Thank you for taking time to come for your Medicare Wellness Visit. I appreciate your ongoing commitment to your health goals. Please review the following plan we discussed and let me know if I can assist you in the future.  ? ?Screening recommendations/referrals: ?Colonoscopy: Pt is getting scheduled ?Mammogram: 08/25/21 due 08/25/21 ?Bone Density: 08/25/21 due 08/26/23 ?Recommended yearly ophthalmology/optometry visit for glaucoma screening and checkup ?Recommended yearly dental visit for hygiene and checkup ? ?Vaccinations: ?Influenza vaccine: up to date ?Pneumococcal vaccine: up to date ?Tdap vaccine: up to date ?Shingles vaccine: up to date   ?Covid-19:completed ? ?Advanced directives: yes, on file ? ?Conditions/risks identified: see problem list  ? ?Next appointment: Follow up in one year for your annual wellness visit  ? ? ?Preventive Care 32 Years and Older, Female ?Preventive care refers to lifestyle choices and visits with your health care provider that can promote health and wellness. ?What does preventive care include? ?A yearly physical exam. This is also called an annual well check. ?Dental exams once or twice a year. ?Routine eye exams. Ask your health care provider how often you should have your eyes checked. ?Personal lifestyle choices, including: ?Daily care of your teeth and gums. ?Regular physical activity. ?Eating a healthy diet. ?Avoiding tobacco and drug use. ?Limiting alcohol use. ?Practicing safe sex. ?Taking low-dose aspirin every day. ?Taking vitamin and mineral supplements as recommended by your health care provider. ?What happens during an annual well check? ?The services and screenings done by your health care provider during your annual well check will depend on your age, overall health, lifestyle risk factors, and family history of disease. ?Counseling  ?Your health care provider may ask you questions about your: ?Alcohol use. ?Tobacco use. ?Drug use. ?Emotional  well-being. ?Home and relationship well-being. ?Sexual activity. ?Eating habits. ?History of falls. ?Memory and ability to understand (cognition). ?Work and work Statistician. ?Reproductive health. ?Screening  ?You may have the following tests or measurements: ?Height, weight, and BMI. ?Blood pressure. ?Lipid and cholesterol levels. These may be checked every 5 years, or more frequently if you are over 24 years old. ?Skin check. ?Lung cancer screening. You may have this screening every year starting at age 44 if you have a 30-pack-year history of smoking and currently smoke or have quit within the past 15 years. ?Fecal occult blood test (FOBT) of the stool. You may have this test every year starting at age 61. ?Flexible sigmoidoscopy or colonoscopy. You may have a sigmoidoscopy every 5 years or a colonoscopy every 10 years starting at age 43. ?Hepatitis C blood test. ?Hepatitis B blood test. ?Sexually transmitted disease (STD) testing. ?Diabetes screening. This is done by checking your blood sugar (glucose) after you have not eaten for a while (fasting). You may have this done every 1-3 years. ?Bone density scan. This is done to screen for osteoporosis. You may have this done starting at age 39. ?Mammogram. This may be done every 1-2 years. Talk to your health care provider about how often you should have regular mammograms. ?Talk with your health care provider about your test results, treatment options, and if necessary, the need for more tests. ?Vaccines  ?Your health care provider may recommend certain vaccines, such as: ?Influenza vaccine. This is recommended every year. ?Tetanus, diphtheria, and acellular pertussis (Tdap, Td) vaccine. You may need a Td booster every 10 years. ?Zoster vaccine. You may need this after age 2. ?Pneumococcal 13-valent conjugate (PCV13) vaccine. One dose is recommended after age 46. ?Pneumococcal polysaccharide (PPSV23)  vaccine. One dose is recommended after age 73. ?Talk to your  health care provider about which screenings and vaccines you need and how often you need them. ?This information is not intended to replace advice given to you by your health care provider. Make sure you discuss any questions you have with your health care provider. ?Document Released: 08/07/2015 Document Revised: 03/30/2016 Document Reviewed: 05/12/2015 ?Elsevier Interactive Patient Education ? 2017 [place]. ? ?Fall Prevention in the Home ?Falls can cause injuries. They can happen to people of all ages. There are many things you can do to make your home safe and to help prevent falls. ?What can I do on the outside of my home? ?Regularly fix the edges of walkways and driveways and fix any cracks. ?Remove anything that might make you trip as you walk through a door, such as a raised step or threshold. ?Trim any bushes or trees on the path to your home. ?Use bright outdoor lighting. ?Clear any walking paths of anything that might make someone trip, such as rocks or tools. ?Regularly check to see if handrails are loose or broken. Make sure that both sides of any steps have handrails. ?Any raised decks and porches should have guardrails on the edges. ?Have any leaves, snow, or ice cleared regularly. ?Use sand or salt on walking paths during winter. ?Clean up any spills in your garage right away. This includes oil or grease spills. ?What can I do in the bathroom? ?Use night lights. ?Install grab bars by the toilet and in the tub and shower. Do not use towel bars as grab bars. ?Use non-skid mats or decals in the tub or shower. ?If you need to sit down in the shower, use a plastic, non-slip stool. ?Keep the floor dry. Clean up any water that spills on the floor as soon as it happens. ?Remove soap buildup in the tub or shower regularly. ?Attach bath mats securely with double-sided non-slip rug tape. ?Do not have throw rugs and other things on the floor that can make you trip. ?What can I do in the bedroom? ?Use night  lights. ?Make sure that you have a light by your bed that is easy to reach. ?Do not use any sheets or blankets that are too big for your bed. They should not hang down onto the floor. ?Have a firm chair that has side arms. You can use this for support while you get dressed. ?Do not have throw rugs and other things on the floor that can make you trip. ?What can I do in the kitchen? ?Clean up any spills right away. ?Avoid walking on wet floors. ?Keep items that you use a lot in easy-to-reach places. ?If you need to reach something above you, use a strong step stool that has a grab bar. ?Keep electrical cords out of the way. ?Do not use floor polish or wax that makes floors slippery. If you must use wax, use non-skid floor wax. ?Do not have throw rugs and other things on the floor that can make you trip. ?What can I do with my stairs? ?Do not leave any items on the stairs. ?Make sure that there are handrails on both sides of the stairs and use them. Fix handrails that are broken or loose. Make sure that handrails are as long as the stairways. ?Check any carpeting to make sure that it is firmly attached to the stairs. Fix any carpet that is loose or worn. ?Avoid having throw rugs at the top or bottom  of the stairs. If you do have throw rugs, attach them to the floor with carpet tape. ?Make sure that you have a light switch at the top of the stairs and the bottom of the stairs. If you do not have them, ask someone to add them for you. ?What else can I do to help prevent falls? ?Wear shoes that: ?Do not have high heels. ?Have rubber bottoms. ?Are comfortable and fit you well. ?Are closed at the toe. Do not wear sandals. ?If you use a stepladder: ?Make sure that it is fully opened. Do not climb a closed stepladder. ?Make sure that both sides of the stepladder are locked into place. ?Ask someone to hold it for you, if possible. ?Clearly mark and make sure that you can see: ?Any grab bars or handrails. ?First and last  steps. ?Where the edge of each step is. ?Use tools that help you move around (mobility aids) if they are needed. These include: ?Canes. ?Walkers. ?Scooters. ?Crutches. ?Turn on the lights when you go into a dark area. Replace

## 2021-10-12 ENCOUNTER — Ambulatory Visit: Payer: Medicare HMO | Admitting: Orthopaedic Surgery

## 2021-10-13 ENCOUNTER — Ambulatory Visit (INDEPENDENT_AMBULATORY_CARE_PROVIDER_SITE_OTHER): Payer: Medicare HMO

## 2021-10-13 ENCOUNTER — Ambulatory Visit (INDEPENDENT_AMBULATORY_CARE_PROVIDER_SITE_OTHER): Payer: Medicare HMO | Admitting: Orthopaedic Surgery

## 2021-10-13 ENCOUNTER — Encounter: Payer: Self-pay | Admitting: Orthopaedic Surgery

## 2021-10-13 DIAGNOSIS — Z96641 Presence of right artificial hip joint: Secondary | ICD-10-CM

## 2021-10-13 NOTE — Progress Notes (Signed)
HPI: Jocelyn Morgan returns today for follow-up of her right total hip arthroplasty which was performed 03/05/2021.  She still having some muscle soreness and some pain in the posterior aspect of her hip.  She is having no groin pain.  No numbness tingling down the leg.  She does have occasional burning around the incision.  She is walking about a mile and a half 3-5 times a week.  She denies any back pain. ? ?Review of systems see HPI otherwise negative noncontributory. ? ?Physical exam: General well-developed well-nourished pleasant female in no acute distress ambulates without any assistive device. ? ?Bilateral hips: Excellent range of motion of both hips without pain.  Tenderness over the trochanteric region of both hips. ?Lumbar spine: She comes within a few inches of being able to touch her toes.  She has full extension of the lumbar spine without pain.  No tenderness over the lumbar spinal column or lumbar paraspinous region on the right.  Negative straight leg raise bilaterally. ? ?Radiographs: AP pelvis lateral view of the right hip: No acute fractures.  Well-seated right total hip components. ? 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 ?Plan: ?Recommend she work on IT band stretching exercises as shown.  Offered trochanteric injection she defers.  We will see her back sooner if she develops any radicular symptoms or low back pain and we can always work her back up as the source of her pain.  She may benefit from a trochanteric injection in future.  Questions encouraged and answered at length.  We will see her back at 1 year postop and obtain an AP pelvis and lateral view of the right hip at that time.  Questions were encouraged and answered. ? ? ?

## 2021-10-27 DIAGNOSIS — H5005 Alternating esotropia: Secondary | ICD-10-CM | POA: Diagnosis not present

## 2021-10-27 DIAGNOSIS — H04123 Dry eye syndrome of bilateral lacrimal glands: Secondary | ICD-10-CM | POA: Diagnosis not present

## 2021-10-27 DIAGNOSIS — H532 Diplopia: Secondary | ICD-10-CM | POA: Diagnosis not present

## 2021-10-27 DIAGNOSIS — H40033 Anatomical narrow angle, bilateral: Secondary | ICD-10-CM | POA: Diagnosis not present

## 2021-10-27 DIAGNOSIS — H2511 Age-related nuclear cataract, right eye: Secondary | ICD-10-CM | POA: Diagnosis not present

## 2021-10-27 DIAGNOSIS — H25812 Combined forms of age-related cataract, left eye: Secondary | ICD-10-CM | POA: Diagnosis not present

## 2021-10-28 DIAGNOSIS — H25812 Combined forms of age-related cataract, left eye: Secondary | ICD-10-CM | POA: Diagnosis not present

## 2021-10-28 DIAGNOSIS — H40033 Anatomical narrow angle, bilateral: Secondary | ICD-10-CM | POA: Diagnosis not present

## 2021-10-28 DIAGNOSIS — H04123 Dry eye syndrome of bilateral lacrimal glands: Secondary | ICD-10-CM | POA: Diagnosis not present

## 2021-10-28 DIAGNOSIS — H2511 Age-related nuclear cataract, right eye: Secondary | ICD-10-CM | POA: Diagnosis not present

## 2021-11-21 ENCOUNTER — Other Ambulatory Visit: Payer: Self-pay | Admitting: Family

## 2021-11-30 DIAGNOSIS — K640 First degree hemorrhoids: Secondary | ICD-10-CM | POA: Diagnosis not present

## 2021-11-30 DIAGNOSIS — Z1211 Encounter for screening for malignant neoplasm of colon: Secondary | ICD-10-CM | POA: Diagnosis not present

## 2021-11-30 DIAGNOSIS — K648 Other hemorrhoids: Secondary | ICD-10-CM | POA: Diagnosis not present

## 2021-11-30 DIAGNOSIS — D120 Benign neoplasm of cecum: Secondary | ICD-10-CM | POA: Diagnosis not present

## 2021-11-30 DIAGNOSIS — K635 Polyp of colon: Secondary | ICD-10-CM | POA: Diagnosis not present

## 2021-11-30 DIAGNOSIS — Z86010 Personal history of colonic polyps: Secondary | ICD-10-CM | POA: Diagnosis not present

## 2021-11-30 DIAGNOSIS — Z8371 Family history of colonic polyps: Secondary | ICD-10-CM | POA: Diagnosis not present

## 2021-11-30 DIAGNOSIS — K5730 Diverticulosis of large intestine without perforation or abscess without bleeding: Secondary | ICD-10-CM | POA: Diagnosis not present

## 2021-11-30 LAB — HM COLONOSCOPY

## 2021-12-02 ENCOUNTER — Other Ambulatory Visit: Payer: Self-pay | Admitting: Family

## 2021-12-02 DIAGNOSIS — E7800 Pure hypercholesterolemia, unspecified: Secondary | ICD-10-CM

## 2022-01-10 ENCOUNTER — Ambulatory Visit (INDEPENDENT_AMBULATORY_CARE_PROVIDER_SITE_OTHER): Payer: Medicare HMO | Admitting: Family

## 2022-01-10 VITALS — BP 137/75 | HR 71 | Temp 98.4°F | Resp 16 | Ht 62.0 in | Wt 131.0 lb

## 2022-01-10 DIAGNOSIS — G4700 Insomnia, unspecified: Secondary | ICD-10-CM | POA: Diagnosis not present

## 2022-01-10 DIAGNOSIS — M810 Age-related osteoporosis without current pathological fracture: Secondary | ICD-10-CM

## 2022-01-10 DIAGNOSIS — E7800 Pure hypercholesterolemia, unspecified: Secondary | ICD-10-CM | POA: Diagnosis not present

## 2022-01-10 NOTE — Progress Notes (Signed)
Subjective:   By signing my name below, I, Shehryar Baig, attest that this documentation has been prepared under the direction and in the presence of Debbrah Alar, NP 01/10/2022    Patient ID: Jocelyn Morgan, female    DOB: May 10, 1951, 71 y.o.   MRN: [id number]  Chief Complaint  Patient presents with   Hyperlipidemia    Here for follow up   Insomnia    Trouble staying sleep     Patient is in today for a follow up visit.   Splitting finger nails- She complains of her finger nails splitting. She denies working with any chemicals with her hands. She is willing to start biotin supplements to manage her symptoms.   Cholesterol- Her last cholesterol levels looked good. She continues taking 20 mg pravastatin daily PO and reports no new issues while taking it.  Lab Results  Component Value Date   CHOL 182 07/12/2021   HDL 52.70 07/12/2021   LDLCALC 106 (H) 07/12/2021   LDLDIRECT 109.0 06/25/2019   TRIG 118.0 07/12/2021   CHOLHDL 3 07/12/2021   Sleep- She complains of staying asleep. She has no issues falling asleep. She is taking 0.5 dose of benadryl to manage her sleep and reports doing well while taking it. She has tried melatonin supplements and reports waking up with a headache while taking it so she stopped.   Immunizations- She is due for a flu vaccine the fall season. She is UTD on tetanus vaccine.   Colonoscopy- She is UTD on colonoscopy at this time.   Mammogram- She is UTD on mammogram at this time.     Health Maintenance Due  Topic Date Due   COVID-19 Vaccine (5 - Pfizer series) 07/31/2021    Past Medical History:  Diagnosis Date   Arthritis    osteoarthritis   Hyperlipidemia    Liver hemangioma 03/16/2011   Postmenopausal HRT (hormone replacement therapy)     Past Surgical History:  Procedure Laterality Date   EYE SURGERY     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   Procedure: RIGHT TOTAL HIP ARTHROPLASTY ANTERIOR APPROACH;  Surgeon: Mcarthur Rossetti, MD;  Location: [facility];  Service: Orthopedics;  Laterality: Right;   TUBAL LIGATION      Family History  Problem Relation Age of Onset   Cancer Mother 21       breast   Allergic rhinitis Mother    Urticaria Mother    COPD Father        lived to be 78   Bladder Cancer Father    Ulcerative colitis Brother    CAD Brother    Prostate cancer Brother    Cancer Cousin 73       Stage 3 breast cancer   Arthritis Other    Diabetes Other    Cancer Other        bladder    Social History   Socioeconomic History   Marital status: Married    Spouse name: Not on file   Number of children: 4   Years of education: Not on file   Highest education level: Not on file  Occupational History   Occupation: housewife  Tobacco Use   Smoking status: Never   Smokeless tobacco: Never  Vaping Use   Vaping Use: Never used  Substance and Sexual Activity   Alcohol use: Yes    Alcohol/week: 1.0 - 2.0 standard drink of alcohol    Types: 1 - 2 Glasses of wine per week  Comment: occas   Drug use: No   Sexual activity: Yes  Other Topics Concern   Not on file  Social History Narrative   Exercise: sometimes   Caffeine: 2 cups 1/2 and 1/2 coffee daily   6 grandchildren (none of her children are in [place])   Married         Social Determinants of Health   Financial Resource Strain: Low Risk  (10/06/2021)   Overall Financial Resource Strain (CARDIA)    Difficulty of Paying Living Expenses: Not hard at all  Food Insecurity: No Food Insecurity (10/06/2021)   Hunger Vital Sign    Worried About Running Out of Food in the Last Year: Never true    [place] in the Last Year: Never true  Transportation Needs: No Transportation Needs (10/06/2021)   PRAPARE - Hydrologist (Medical): No    Lack of Transportation (Non-Medical): No  Physical Activity: Not on file  Stress: No Stress Concern Present (10/06/2021)   Bath    Feeling of Stress : Not at all  Social Connections: Moderately Integrated (10/06/2021)   Social Connection and Isolation Panel [NHANES]    Frequency of Communication with Friends and Family: More than three times a week    Frequency of Social Gatherings with Friends and Family: More than three times a week    Attends Religious Services: More than 4 times per year    Active Member of [employer] or Organizations: No    Attends Archivist Meetings: Never    Marital Status: Married  Human resources officer Violence: Not At Risk (10/06/2021)   Humiliation, Afraid, Rape, and Kick questionnaire    Fear of Current or Ex-Partner: No    Emotionally Abused: No    Physically Abused: No    Sexually Abused: No    Outpatient Medications Prior to Visit  Medication Sig Dispense Refill   acetaminophen (TYLENOL) 650 MG CR tablet Take 650 mg by mouth every 8 (eight) hours as needed for pain.     alendronate (FOSAMAX) 70 MG tablet TAKE 1 TABLET BY MOUTH ONCE WEEKLY ON AN EMPTY STOMACH BEFORE BREAKFAST. REMAIN UPRIGHT FOR 30 MINUTES AND TAKE WITH 8 OUNCES OF WATER 12 tablet 3   Calcium Carb-Cholecalciferol (CALCIUM 600 + D PO) Take 1 tablet by mouth in the morning and at bedtime.     cholecalciferol (VITAMIN D3) 25 MCG (1000 UNIT) tablet Take 1,000 Units by mouth daily.     EPINEPHrine 0.3 mg/0.3 mL IJ SOAJ injection Inject 0.3 mLs (0.3 mg total) into the muscle as needed for anaphylaxis. 2 each 1   meclizine (ANTIVERT) 25 MG tablet Take 1 tablet (25 mg total) by mouth 3 (three) times daily as needed for dizziness. 30 tablet 0   Melatonin 3 MG CAPS Take 3 mg by mouth at bedtime as needed (sleep).     Misc Natural Products (JOINT HEALTH PO) Take 1 tablet by mouth daily.     Multiple Vitamin (MULTIVITAMIN WITH MINERALS) TABS tablet Take 1 tablet by mouth daily.     pravastatin (PRAVACHOL) 20 MG tablet TAKE ONE TABLET BY MOUTH DAILY 90 tablet 1   Facility-Administered  Medications Prior to Visit  Medication Dose Route Frequency Provider Last Rate Last Admin   omalizumab Arvid Right) injection 300 mg  300 mg Subcutaneous Q28 days Bobbitt, [name], MD   300 mg at 03/26/20 3664    Allergies  Allergen  Reactions   Demerol [Meperidine Hcl] Nausea And Vomiting    In combination with versed caused severe n/v and headaches    Versed [Midazolam] Nausea And Vomiting    In combination with demerol caused severe n/v and headaches     Review of Systems  Skin:        (+)splitting finger nails  Psychiatric/Behavioral:  The patient has insomnia (difficulty staying asleep).        Objective:    Physical Exam Constitutional:      General: She is not in acute distress.    Appearance: Normal appearance. She is not ill-appearing.  HENT:     Head: Normocephalic and atraumatic.     Right Ear: External ear normal.     Left Ear: External ear normal.  Eyes:     Extraocular Movements: Extraocular movements intact.     Pupils: Pupils are equal, round, and reactive to light.  Cardiovascular:     Rate and Rhythm: Normal rate and regular rhythm.     Heart sounds: Normal heart sounds. No murmur heard.    No gallop.  Pulmonary:     Effort: Pulmonary effort is normal. No respiratory distress.     Breath sounds: Normal breath sounds. No wheezing or rales.  Skin:    General: Skin is warm and dry.  Neurological:     Mental Status: She is alert and oriented to person, place, and time.  Psychiatric:        Judgment: Judgment normal.     BP 137/75 (BP Location: Right Arm, Patient Position: Sitting, Cuff Size: Small)   Pulse 71   Temp 98.4 F (36.9 C) (Oral)   Resp 16   Ht '5\' 2"'$  (1.575 m)   Wt 131 lb (59.4 kg)   LMP 07/25/1998   SpO2 99%   BMI 23.96 kg/m  Wt Readings from Last 3 Encounters:  01/10/22 131 lb (59.4 kg)  07/12/21 131 lb (59.4 kg)  05/07/21 130 lb (59 kg)       Assessment & Plan:   Problem List Items Addressed This Visit        Unprioritized   Osteoporosis    Continues fosamax weekly.       Insomnia - Primary    Stable with use of 1/2 tablet of benadryl HS.       HYPERCHOLESTEROLEMIA    At goal. Continue pravastatin.  Lab Results  Component Value Date   CHOL 182 07/12/2021   HDL 52.70 07/12/2021   LDLCALC 106 (H) 07/12/2021   LDLDIRECT 109.0 06/25/2019   TRIG 118.0 07/12/2021   CHOLHDL 3 07/12/2021          No orders of the defined types were placed in this encounter.   I, Nance Pear, NP, personally preformed the services described in this documentation.  All medical record entries made by the scribe were at my direction and in my presence.  I have reviewed the chart and discharge instructions (if applicable) and agree that the record reflects my personal performance and is accurate and complete. 01/10/2022   I,Shehryar Baig,acting as a Education administrator for Nance Pear, NP.,have documented all relevant documentation on the behalf of Nance Pear, NP,as directed by  Nance Pear, NP while in the presence of Nance Pear, NP.   Nance Pear, NP

## 2022-01-10 NOTE — Assessment & Plan Note (Signed)
" >>  ASSESSMENT AND PLAN FOR HYPERCHOLESTEROLEMIA WRITTEN ON 01/10/2022  8:47 AM BY O'SULLIVAN, Riese Hellard, NP  At goal. Continue pravastatin .  Lab Results  Component Value Date   CHOL 182 07/12/2021   HDL 52.70 07/12/2021   LDLCALC 106 (H) 07/12/2021   LDLDIRECT 109.0 06/25/2019   TRIG 118.0 07/12/2021   CHOLHDL 3 07/12/2021    "

## 2022-01-10 NOTE — Assessment & Plan Note (Signed)
Continues fosamax weekly.

## 2022-01-10 NOTE — Assessment & Plan Note (Addendum)
Stable with use of 1/2 tablet of benadryl HS.

## 2022-01-10 NOTE — Assessment & Plan Note (Signed)
At goal. Continue pravastatin.  Lab Results  Component Value Date   CHOL 182 07/12/2021   HDL 52.70 07/12/2021   LDLCALC 106 (H) 07/12/2021   LDLDIRECT 109.0 06/25/2019   TRIG 118.0 07/12/2021   CHOLHDL 3 07/12/2021

## 2022-02-03 DIAGNOSIS — H04123 Dry eye syndrome of bilateral lacrimal glands: Secondary | ICD-10-CM | POA: Diagnosis not present

## 2022-02-03 DIAGNOSIS — H2511 Age-related nuclear cataract, right eye: Secondary | ICD-10-CM | POA: Diagnosis not present

## 2022-02-03 DIAGNOSIS — H25812 Combined forms of age-related cataract, left eye: Secondary | ICD-10-CM | POA: Diagnosis not present

## 2022-02-03 DIAGNOSIS — H40033 Anatomical narrow angle, bilateral: Secondary | ICD-10-CM | POA: Diagnosis not present

## 2022-02-09 DIAGNOSIS — H2511 Age-related nuclear cataract, right eye: Secondary | ICD-10-CM | POA: Diagnosis not present

## 2022-02-09 DIAGNOSIS — H2513 Age-related nuclear cataract, bilateral: Secondary | ICD-10-CM | POA: Diagnosis not present

## 2022-02-22 HISTORY — PX: EYE SURGERY: SHX253

## 2022-03-01 DIAGNOSIS — H269 Unspecified cataract: Secondary | ICD-10-CM | POA: Diagnosis not present

## 2022-03-01 DIAGNOSIS — H2511 Age-related nuclear cataract, right eye: Secondary | ICD-10-CM | POA: Diagnosis not present

## 2022-03-10 DIAGNOSIS — H25812 Combined forms of age-related cataract, left eye: Secondary | ICD-10-CM | POA: Diagnosis not present

## 2022-03-22 DIAGNOSIS — H269 Unspecified cataract: Secondary | ICD-10-CM | POA: Diagnosis not present

## 2022-03-22 DIAGNOSIS — H25812 Combined forms of age-related cataract, left eye: Secondary | ICD-10-CM | POA: Diagnosis not present

## 2022-05-24 ENCOUNTER — Ambulatory Visit: Payer: Medicare HMO | Admitting: Physician Assistant

## 2022-05-24 DIAGNOSIS — H5201 Hypermetropia, right eye: Secondary | ICD-10-CM | POA: Diagnosis not present

## 2022-05-30 ENCOUNTER — Other Ambulatory Visit: Payer: Self-pay | Admitting: Family

## 2022-05-30 DIAGNOSIS — E7800 Pure hypercholesterolemia, unspecified: Secondary | ICD-10-CM

## 2022-06-08 DIAGNOSIS — Z961 Presence of intraocular lens: Secondary | ICD-10-CM | POA: Diagnosis not present

## 2022-06-24 ENCOUNTER — Encounter: Payer: Self-pay | Admitting: Family

## 2022-07-13 ENCOUNTER — Encounter: Payer: Self-pay | Admitting: Family

## 2022-07-13 ENCOUNTER — Ambulatory Visit (INDEPENDENT_AMBULATORY_CARE_PROVIDER_SITE_OTHER): Payer: Medicare HMO | Admitting: Family

## 2022-07-13 VITALS — BP 140/76 | HR 82 | Temp 98.0°F | Resp 18 | Ht 62.0 in | Wt 133.6 lb

## 2022-07-13 DIAGNOSIS — Z0000 Encounter for general adult medical examination without abnormal findings: Secondary | ICD-10-CM

## 2022-07-13 DIAGNOSIS — M5410 Radiculopathy, site unspecified: Secondary | ICD-10-CM | POA: Diagnosis not present

## 2022-07-13 DIAGNOSIS — H8112 Benign paroxysmal vertigo, left ear: Secondary | ICD-10-CM

## 2022-07-13 DIAGNOSIS — E7800 Pure hypercholesterolemia, unspecified: Secondary | ICD-10-CM | POA: Diagnosis not present

## 2022-07-13 LAB — COMPREHENSIVE METABOLIC PANEL
ALT: 14 U/L (ref 0–35)
AST: 18 U/L (ref 0–37)
Albumin: 4.6 g/dL (ref 3.5–5.2)
Alkaline Phosphatase: 41 U/L (ref 39–117)
BUN: 18 mg/dL (ref 6–23)
CO2: 28 mEq/L (ref 19–32)
Calcium: 9.5 mg/dL (ref 8.4–10.5)
Chloride: 104 mEq/L (ref 96–112)
Creatinine, Ser: 0.66 mg/dL (ref 0.40–1.20)
GFR: 87.58 mL/min (ref >60.00)
Glucose, Bld: 95 mg/dL (ref 70–99)
Potassium: 4.6 mEq/L (ref 3.5–5.1)
Sodium: 141 mEq/L (ref 135–145)
Total Bilirubin: 0.5 mg/dL (ref 0.2–1.2)
Total Protein: 6.9 g/dL (ref 6.0–8.3)

## 2022-07-13 LAB — LIPID PANEL
Cholesterol: 196 mg/dL (ref 0–200)
HDL: 53 mg/dL (ref >39.00)
LDL Cholesterol: 127 mg/dL — ABNORMAL HIGH (ref 0–99)
NonHDL: 142.83
Total CHOL/HDL Ratio: 4
Triglycerides: 81 mg/dL (ref 0.0–149.0)
VLDL: 16.2 mg/dL (ref 0.0–40.0)

## 2022-07-13 NOTE — Assessment & Plan Note (Signed)
Mild left sided symptoms. Offered vestibular rehab. Pt declines at this time. Will monitor.

## 2022-07-13 NOTE — Progress Notes (Signed)
Subjective:     Patient ID: Jocelyn Morgan, female    DOB: [birth year], 71 y.o.   MRN: [id number]  Chief Complaint  Patient presents with   Annual Exam    HPI  Patient presents today for complete physical.  Immunizations: up to date Diet: healthy Exercise: fair  Colonoscopy: 11/30/2021 Dexa: 2/23  Pap Smear: N/A Mammogram: 2/23 Vision/dental: up to date   There are no preventive care reminders to display for this patient.  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   Procedure: RIGHT TOTAL HIP ARTHROPLASTY ANTERIOR APPROACH;  Surgeon: Mcarthur Rossetti, MD;  Location: [facility];  Service: Orthopedics;  Laterality: Right;   TUBAL LIGATION      Family History  Problem Relation Age of Onset   Cancer Mother 45       breast   Allergic rhinitis Mother    Urticaria Mother    COPD Father        lived to be 8   Bladder Cancer Father    Ulcerative colitis Brother    CAD Brother    Prostate cancer Brother    Cancer Cousin 51       Stage 3 breast cancer   Arthritis Other    Diabetes Other    Cancer Other        bladder   Vitiligo Son     Social History   Socioeconomic History   Marital status: Married    Spouse name: Not on file   Number of children: 4   Years of education: Not on file   Highest education level: Not on file  Occupational History   Occupation: housewife  Tobacco Use   Smoking status: Never   Smokeless tobacco: Never  Vaping Use   Vaping Use: Never used  Substance and Sexual Activity   Alcohol use: Yes    Alcohol/week: 1.0 - 2.0 standard drink of alcohol    Types: 1 - 2 Glasses of wine per week    Comment: occasional   Drug use: No   Sexual  activity: Yes  Other Topics Concern   Not on file  Social History Narrative   Exercise: sometimes   Caffeine: 2 cups 1/2 and 1/2 coffee daily   6 grandchildren (none of her children are in [place])   Married   Copywriter, advertising          Social Determinants of Health   Financial Resource Strain: Low Risk  (10/06/2021)   Overall Financial Resource Strain (CARDIA)    Difficulty of Paying Living Expenses: Not hard at all  Food Insecurity: No Food Insecurity (10/06/2021)   Hunger Vital Sign    Worried About Running Out of Food in the Last Year: Never true    [place] in the Last Year: Never true  Transportation Needs: No Transportation Needs (10/06/2021)   PRAPARE - Hydrologist (Medical): No    Lack of Transportation (Non-Medical): No  Physical Activity: Not on file  Stress: No Stress Concern Present (10/06/2021)   [place]    Feeling of Stress : Not at all  Social Connections: Moderately Integrated (10/06/2021)   Social Connection and Isolation Panel [NHANES]    Frequency of Communication with Friends and Family: More than three times a week    Frequency of Social Gatherings with Friends and Family: More than three times a week    Attends Religious Services: More than 4 times per year    Active Member of [employer] or Organizations: No    Attends Archivist Meetings: Never    Marital Status: Married  Human resources officer Violence: Not At Risk (10/06/2021)   Humiliation, Afraid, Rape, and Kick questionnaire    Fear of Current or Ex-Partner: No    Emotionally Abused: No    Physically Abused: No    Sexually Abused: No    Outpatient Medications Prior to Visit  Medication Sig Dispense Refill   acetaminophen (TYLENOL) 650 MG CR tablet Take 650 mg by mouth every 8 (eight) hours as needed for pain.     alendronate (FOSAMAX) 70 MG tablet TAKE 1 TABLET BY MOUTH ONCE WEEKLY ON AN EMPTY  STOMACH BEFORE BREAKFAST. REMAIN UPRIGHT FOR 30 MINUTES AND TAKE WITH 8 OUNCES OF WATER 12 tablet 3   Calcium Carb-Cholecalciferol (CALCIUM 600 + D PO) Take 1 tablet by mouth in the morning and at bedtime.     cholecalciferol (VITAMIN D3) 25 MCG (1000 UNIT) tablet Take 1,000 Units by mouth daily.     EPINEPHrine 0.3 mg/0.3 mL IJ SOAJ injection Inject 0.3 mLs (0.3 mg total) into the muscle as needed for anaphylaxis. 2 each 1   meclizine (ANTIVERT) 25 MG tablet Take 1 tablet (25 mg total) by mouth 3 (three) times daily as needed for dizziness. 30 tablet 0   Melatonin 3 MG CAPS Take 3 mg by mouth at bedtime as needed (sleep).     Misc Natural Products (JOINT HEALTH PO) Take 1 tablet by mouth daily.     Multiple Vitamin (MULTIVITAMIN WITH MINERALS) TABS tablet Take 1 tablet by mouth daily.     pravastatin (PRAVACHOL) 20 MG tablet TAKE ONE TABLET BY MOUTH DAILY 90 tablet 1   Facility-Administered Medications Prior to Visit  Medication Dose Route Frequency Provider Last Rate Last Admin   omalizumab Arvid Right) injection 300 mg  300 mg Subcutaneous Q28 days Bobbitt, [name], MD   300 mg at 03/26/20 1610    Allergies  Allergen Reactions   Demerol [Meperidine Hcl] Nausea And Vomiting    In combination with versed caused severe n/v and headaches    Versed [Midazolam] Nausea And Vomiting    In combination with demerol caused severe n/v and headaches     Review of Systems  Constitutional:  Negative for weight loss.  HENT:  Positive for hearing loss (has hearing aids).   Eyes:  Negative for blurred vision.  Respiratory:  Negative for cough and shortness of breath.   Cardiovascular:  Negative for chest pain.  Gastrointestinal:  Positive for diarrhea (mild comes and goes). Negative for constipation.  Genitourinary:  Negative for dysuria, frequency and urgency.  Musculoskeletal:        Some right sided cervical radiculopathy  Skin:  Negative for rash.  Neurological:  Positive for dizziness (left  sided with movement) and tingling (sometimes in fingers with some shoulder pain on right). Negative for headaches.  Psychiatric/Behavioral:         Denies depression/anxiety       Objective:    Physical Exam  BP (!) 140/76   Pulse 82   Temp 98 F (36.7 C)  Resp 18   Ht _0  (1.575 m)   Wt 133 lb 9.6 oz (60.6 kg)   LMP 07/25/1998   SpO2 97%   BMI 24.44 kg/m  Wt Readings from Last 3 Encounters:  07/13/22 133 lb 9.6 oz (60.6 kg)  01/10/22 131 lb (59.4 kg)  07/12/21 131 lb (59.4 kg)   Physical Exam  Constitutional: She is oriented to person, place, and time. She appears well-developed and well-nourished. No distress.  HENT:  Head: Normocephalic and atraumatic.  Right Ear: Tympanic membrane and ear canal normal.  Left Ear: Tympanic membrane and ear canal normal.  Mouth/Throat: Oropharynx is clear and moist.  Eyes: Pupils are equal, round, and reactive to light. No scleral icterus.  Neck: Normal range of motion. No thyromegaly present.  Cardiovascular: Normal rate and regular rhythm.   No murmur heard. Pulmonary/Chest: Effort normal and breath sounds normal. No respiratory distress. He has no wheezes. She has no rales. She exhibits no tenderness.  Abdominal: Soft. Bowel sounds are normal. She exhibits no distension and no mass. There is no tenderness. There is no rebound and no guarding.  Musculoskeletal: She exhibits no edema.  Lymphadenopathy:    She has no cervical adenopathy.  Neurological: She is alert and oriented to person, place, and time. She has normal patellar reflexes. She exhibits normal muscle tone. Coordination normal.  Skin: Skin is warm and dry.  Psychiatric: She has a normal mood and affect. Her behavior is normal. Judgment and thought content normal.           Assessment & Plan:       Assessment & Plan:   Problem List Items Addressed This Visit       Unprioritized   Radiculopathy    Right cervical radiculopathy- mild intermittent. Offered  NSAID trial, pt declines will continue prn tylenol/motrin at home and let me know if symptoms worsen.       Preventative health care - Primary    Continue healthy diet, exercise.  Immunizations reviewed and up to date.  Mammo/colo up to date. Vision/dental up to date.       HYPERCHOLESTEROLEMIA    Continues statin, repeat lipid panel.       Relevant Orders   Lipid panel   Comp Met (CMET)   Benign paroxysmal positional vertigo    Mild left sided symptoms. Offered vestibular rehab. Pt declines at this time. Will monitor.        I am having Midge Minium. Duerr maintain her EPINEPHrine, Misc Natural Products (JOINT HEALTH PO), acetaminophen, multivitamin with minerals, Calcium Carb-Cholecalciferol (CALCIUM 600 + D PO), cholecalciferol, Melatonin, meclizine, alendronate, and pravastatin. We will continue to administer omalizumab.  No orders of the defined types were placed in this encounter.

## 2022-07-13 NOTE — Assessment & Plan Note (Signed)
Right cervical radiculopathy- mild intermittent. Offered NSAID trial, pt declines will continue prn tylenol/motrin at home and let me know if symptoms worsen.

## 2022-07-13 NOTE — Assessment & Plan Note (Signed)
Continue healthy diet, exercise.  Immunizations reviewed and up to date.  Mammo/colo up to date. Vision/dental up to date.

## 2022-07-13 NOTE — Assessment & Plan Note (Signed)
Continues statin, repeat lipid panel.

## 2022-07-13 NOTE — Assessment & Plan Note (Signed)
" >>  ASSESSMENT AND PLAN FOR HYPERCHOLESTEROLEMIA WRITTEN ON 07/13/2022  8:24 AM BY O'SULLIVAN, Brayden Brodhead, NP  Continues statin, repeat lipid panel.  "

## 2022-08-05 ENCOUNTER — Other Ambulatory Visit: Payer: Self-pay | Admitting: Family

## 2022-08-05 DIAGNOSIS — Z1231 Encounter for screening mammogram for malignant neoplasm of breast: Secondary | ICD-10-CM

## 2022-08-31 ENCOUNTER — Ambulatory Visit (INDEPENDENT_AMBULATORY_CARE_PROVIDER_SITE_OTHER): Payer: Medicare HMO

## 2022-08-31 DIAGNOSIS — Z1231 Encounter for screening mammogram for malignant neoplasm of breast: Secondary | ICD-10-CM | POA: Diagnosis not present

## 2022-09-27 ENCOUNTER — Telehealth: Payer: Self-pay | Admitting: Family

## 2022-09-27 NOTE — Telephone Encounter (Signed)
Contacted [name] to schedule their annual wellness visit. Appointment made for 10/10/2022.  Sherol Dade; Care Guide Ambulatory Clinical [facility] Direct Dial: [phone]

## 2022-09-27 NOTE — Telephone Encounter (Signed)
Copied from [place] [id number]. Topic: Medicare AWV >> Sep 27, 2022 10:24 AM Devoria Glassing wrote: Reason for CRM: Called patient to schedule Medicare Annual Wellness Visit (AWV). Left message for patient to call back and schedule Medicare Annual Wellness Visit (AWV).  Last date of AWV: 10/06/2021  Please schedule an appointment at any time with [facility].  If any questions, please contact me.  Thank you ,  Sherol Dade; [place] Direct Dial: [phone]

## 2022-10-19 ENCOUNTER — Ambulatory Visit (INDEPENDENT_AMBULATORY_CARE_PROVIDER_SITE_OTHER): Payer: Medicare HMO | Admitting: *Deleted

## 2022-10-19 DIAGNOSIS — Z0000 Encounter for general adult medical examination without abnormal findings: Secondary | ICD-10-CM | POA: Diagnosis not present

## 2022-10-19 NOTE — Progress Notes (Signed)
Subjective:  Pt completed ADLs, Fall risk, and SDOH during e-check in on 10/18/22. Answers verified with pt.    Jocelyn Morgan is a 72 y.o. female who presents for Medicare Annual (Subsequent) preventive examination.  I connected with  [name] on 10/19/22 by a audio enabled telemedicine application and verified that I am speaking with the correct person using two identifiers.  Patient Location: Home  Provider Location: Office/Clinic  I discussed the limitations of evaluation and management by telemedicine. The patient expressed understanding and agreed to proceed.   Review of Systems     Cardiac Risk Factors include: advanced age (>11men, >31 women);dyslipidemia     Objective:    There were no vitals filed for this visit. There is no height or weight on file to calculate BMI.     10/19/2022    8:23 AM 10/06/2021    9:13 AM 05/07/2021   10:00 AM 03/05/2021    3:00 PM 03/05/2021    8:55 AM 02/26/2021   10:56 AM 09/16/2018    9:00 PM  Advanced Directives  Does Patient Have a Medical Advance Directive? Yes Yes Yes Yes Yes Yes No  Type of Paramedic of [place];Living will [place];Living will;Out of facility DNR (pink MOST or yellow form) [place];Living will [place];Living will [place];Living will [place];Living will   Does patient want to make changes to medical advance directive? No - Patient declined  No - Patient declined No - Patient declined     Copy of [place] in Chart? Yes - validated most recent copy scanned in chart (See row information) No - copy requested No - copy requested Yes - validated most recent copy scanned in chart (See row information) Yes - validated most recent copy scanned in chart (See row information)    Would patient like information on creating a medical advance directive?       No - Patient  declined    Current Medications (verified) Outpatient Encounter Medications as of 10/19/2022  Medication Sig   acetaminophen (TYLENOL) 650 MG CR tablet Take 650 mg by mouth every 8 (eight) hours as needed for pain.   alendronate (FOSAMAX) 70 MG tablet TAKE 1 TABLET BY MOUTH ONCE WEEKLY ON AN EMPTY STOMACH BEFORE BREAKFAST. REMAIN UPRIGHT FOR 30 MINUTES AND TAKE WITH 8 OUNCES OF WATER   Calcium Carb-Cholecalciferol (CALCIUM 600 + D PO) Take 1 tablet by mouth in the morning and at bedtime.   cholecalciferol (VITAMIN D3) 25 MCG (1000 UNIT) tablet Take 1,000 Units by mouth daily.   meclizine (ANTIVERT) 25 MG tablet Take 1 tablet (25 mg total) by mouth 3 (three) times daily as needed for dizziness.   Misc Natural Products (JOINT HEALTH PO) Take 1 tablet by mouth daily.   Multiple Vitamin (MULTIVITAMIN WITH MINERALS) TABS tablet Take 1 tablet by mouth daily.   pravastatin (PRAVACHOL) 20 MG tablet TAKE ONE TABLET BY MOUTH DAILY   [DISCONTINUED] EPINEPHrine 0.3 mg/0.3 mL IJ SOAJ injection Inject 0.3 mLs (0.3 mg total) into the muscle as needed for anaphylaxis.   [DISCONTINUED] Melatonin 3 MG CAPS Take 3 mg by mouth at bedtime as needed (sleep).   [DISCONTINUED] omalizumab Arvid Right) injection 300 mg    No facility-administered encounter medications on file as of 10/19/2022.    Allergies (verified) Demerol [meperidine hcl] and Versed [midazolam]   History: Past Medical History:  Diagnosis Date  Allergy 03/06/2021   see chart   Arthritis    osteoarthritis   GERD (gastroesophageal reflux disease) Fall of 2023   Hyperlipidemia    Liver hemangioma 03/16/2011   Postmenopausal HRT (hormone replacement therapy)    Past Surgical History:  Procedure Laterality Date   EYE SURGERY Bilateral 02/2022   EYE SURGERY  1954   strabismus   JOINT REPLACEMENT  03/05/2021   right hip   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   Procedure: RIGHT TOTAL HIP ARTHROPLASTY ANTERIOR APPROACH;  Surgeon: Mcarthur Rossetti, MD;  Location: [facility];  Service: Orthopedics;  Laterality: Right;   TUBAL LIGATION     Family History  Problem Relation Age of Onset   Cancer Mother 89       breast   Allergic rhinitis Mother    Urticaria Mother    COPD Father        lived to be 56   Bladder Cancer Father    Ulcerative colitis Brother    CAD Brother    Prostate cancer Brother    Cancer Cousin 39       Stage 3 breast cancer   Arthritis Other    Diabetes Other    Cancer Other        bladder   Vitiligo Son    Social History   Socioeconomic History   Marital status: Married    Spouse name: Not on file   Number of children: 4   Years of education: Not on file   Highest education level: Not on file  Occupational History   Occupation: housewife  Tobacco Use   Smoking status: Never   Smokeless tobacco: Never  Vaping Use   Vaping Use: Never used  Substance and Sexual Activity   Alcohol use: Yes    Alcohol/week: 1.0 - 2.0 standard drink of alcohol    Types: 1 - 2 Glasses of wine per week    Comment: occasional   Drug use: No   Sexual activity: Yes  Other Topics Concern   Not on file  Social History Narrative   Exercise: sometimes   Caffeine: 2 cups 1/2 and 1/2 coffee daily   6 grandchildren (none of her children are in [place])   Married   Copywriter, advertising          Social Determinants of Health   Financial Resource Strain: Low Risk  (10/18/2022)   Overall Financial Resource Strain (CARDIA)    Difficulty of Paying Living Expenses: Not hard at all  Food Insecurity: No Food Insecurity (10/18/2022)   Hunger Vital Sign    Worried About Running Out of Food in the Last Year: Never true    [place] in the Last Year: Never true  Transportation Needs: No Transportation Needs (10/18/2022)   PRAPARE - Hydrologist (Medical): No    Lack of Transportation (Non-Medical): No  Physical Activity: Insufficiently Active (10/18/2022)   Exercise Vital Sign    Days of  Exercise per Week: 4 days    Minutes of Exercise per Session: 30 min  Stress: No Stress Concern Present (10/18/2022)   [place]    Feeling of Stress : Not at all  Social Connections: [place] (10/18/2022)   Social Connection and Isolation Panel [NHANES]    Frequency of Communication with Friends and Family: More than three times a week    Frequency of Social Gatherings with Friends and Family:  Once a week    Attends Religious Services: More than 4 times per year    Active Member of Clubs or Organizations: Yes    Attends Music therapist: More than 4 times per year    Marital Status: Married    Tobacco Counseling Counseling given: Not Answered   Clinical Intake:  Pre-visit preparation completed: Yes  Pain : No/denies pain  Diabetes: No  How often do you need to have someone help you when you read instructions, pamphlets, or other written materials from your doctor or pharmacy?: 1 - Never   Activities of Daily Living    10/18/2022    2:30 PM 10/05/2022    4:09 PM  In your present state of health, do you have any difficulty performing the following activities:  Hearing? 1 0  Comment wears hearing aids   Vision? 0 0  Difficulty concentrating or making decisions? 0 0  Walking or climbing stairs? 0 0  Dressing or bathing? 0 0  Doing errands, shopping? 0 0  Preparing Food and eating ? N N  Using the Toilet? N N  In the past six months, have you accidently leaked urine? Y Y  Do you have problems with loss of bowel control? N N  Managing your Medications? N N  Managing your Finances? N N  Housekeeping or managing your Housekeeping? N N    Patient Care Team: Debbrah Alar, NP as PCP - General Barbaraann Barthel Sharyn Lull, MD as Consulting Physician (Sports Medicine)  Indicate any recent Medical Services you may have received from other than [facility] providers in the past year (date may be  approximate).     Assessment:   This is a routine wellness examination for [facility].  Hearing/Vision screen No results found.  Dietary issues and exercise activities discussed: Current Exercise Habits: Home exercise routine, Type of exercise: walking, Time (Minutes): 35, Frequency (Times/Week): 3, Weekly Exercise (Minutes/Week): 105, Intensity: Mild, Exercise limited by: None identified   Goals Addressed   None    Depression Screen    10/19/2022    8:26 AM 10/06/2021    9:07 AM 04/09/2021   10:31 AM 12/14/2020   12:24 PM 07/08/2020    8:15 AM 06/25/2019    7:42 AM 05/16/2018    8:33 AM  PHQ 2/9 Scores  PHQ - 2 Score 0 0 0 0 0 0 0  PHQ- 9 Score       3    Fall Risk    10/18/2022    2:30 PM 10/05/2022    4:09 PM 10/06/2021    9:07 AM 04/09/2021   10:31 AM 12/14/2020   12:24 PM  Fall Risk   Falls in the past year? 1 1 0 0 0  Number falls in past yr: 0 0 0 0 0  Injury with Fall? 0 0 0 0 0  Risk for fall due to : History of fall(s)  No Fall Risks No Fall Risks   Follow up Falls evaluation completed  Falls evaluation completed Falls evaluation completed     [place]:  Any stairs in or around the home? Yes  If so, are there any without handrails? No  Home free of loose throw rugs in walkways, pet beds, electrical cords, etc? Yes  Adequate lighting in your home to reduce risk of falls? Yes   ASSISTIVE DEVICES UTILIZED TO PREVENT FALLS:  Life alert? No  Use of a cane, walker or w/c? No  Grab  bars in the bathroom? No  Shower chair or bench in shower? No  Elevated toilet seat or a handicapped toilet? Yes   TIMED UP AND GO:  Was the test performed?  No, audio visit .    Cognitive Function:        10/19/2022    8:30 AM 10/06/2021    9:14 AM  6CIT Screen  What Year? 0 points 0 points  What month? 0 points 0 points  What time? 0 points 0 points  Count back from 20 0 points 0 points  Months in reverse 0 points 0 points  Repeat  phrase 0 points 0 points  Total Score 0 points 0 points    Immunizations Immunization History  Administered Date(s) Administered   COVID-19, mRNA, vaccine(Comirnaty)12 years and older 06/03/2022   Fluad Quad(high Dose 65+) 04/25/2019, 05/05/2020, 05/24/2021   Influenza Whole 05/05/2009   Influenza, High Dose Seasonal PF 04/28/2015, 05/04/2016, 05/10/2017, 05/16/2018   Influenza-Unspecified 04/24/2014, 05/24/2022   PFIZER(Purple Top)SARS-COV-2 Vaccination 08/18/2019, 09/09/2019, 06/05/2020, 06/05/2021   Pneumococcal Conjugate-13 04/28/2015   Pneumococcal Polysaccharide-23 05/04/2016   Respiratory Syncytial Virus Vaccine,Recomb Aduvanted(Arexvy) 06/23/2022   Td 03/15/2005, 04/28/2015   Unspecified SARS-COV-2 Vaccination 06/03/2022   Zoster Recombinat (Shingrix) 03/04/2018, 05/04/2018   Zoster, Live 08/31/2010    TDAP status: Up to date  Flu Vaccine status: Up to date  Pneumococcal vaccine status: Up to date  Covid-19 vaccine status: Information provided on how to obtain vaccines.   Qualifies for Shingles Vaccine? Yes   Zostavax completed Yes   Shingrix Completed?: Yes  Screening Tests Health Maintenance  Topic Date Due   COVID-19 Vaccine (6 - 2023-24 season) 07/29/2022   Medicare Annual Wellness (AWV)  10/07/2022   MAMMOGRAM  08/31/2024   DTaP/Tdap/Td (3 - Tdap) 04/27/2025   COLONOSCOPY (Pts 45-43yrs Insurance coverage will need to be confirmed)  12/01/2026   Pneumonia Vaccine 12+ Years old  Completed   INFLUENZA VACCINE  Completed   DEXA SCAN  Completed   Hepatitis C Screening  Completed   Zoster Vaccines- Shingrix  Completed   HPV VACCINES  Aged Out    Health Maintenance  Health Maintenance Due  Topic Date Due   COVID-19 Vaccine (6 - 2023-24 season) 07/29/2022   Medicare Annual Wellness (AWV)  10/07/2022    Colorectal cancer screening: Type of screening: Colonoscopy. Completed 11/30/21. Repeat every 5 years  Mammogram status: Completed 08/31/22. Repeat every  year  Bone Density status: Completed 08/25/21. Results reflect: Bone density results: OSTEOPENIA. Repeat every 2 years.  Lung Cancer Screening: (Low Dose CT Chest recommended if Age 69-80 years, 30 pack-year currently smoking OR have quit w/in 15years.) does not qualify.   Additional Screening:  Hepatitis C Screening: does qualify; Completed 05/04/16  Vision Screening: Recommended annual ophthalmology exams for early detection of glaucoma and other disorders of the eye. Is the patient up to date with their annual eye exam?  Yes  Who is the provider or what is the name of the office in which the patient attends annual eye exams? [facility] If pt is not established with a provider, would they like to be referred to a provider to establish care? No .   Dental Screening: Recommended annual dental exams for proper oral hygiene  Community Resource Referral / Chronic Care Management: CRR required this visit?  No   CCM required this visit?  No      Plan:     I have personally reviewed and noted the following in  the patient's chart:   Medical and social history Use of alcohol, tobacco or illicit drugs  Current medications and supplements including opioid prescriptions. Patient is not currently taking opioid prescriptions. Functional ability and status Nutritional status Physical activity Advanced directives List of other physicians Hospitalizations, surgeries, and ER visits in previous 12 months Vitals Screenings to include cognitive, depression, and falls Referrals and appointments  In addition, I have reviewed and discussed with patient certain preventive protocols, quality metrics, and best practice recommendations. A written personalized care plan for preventive services as well as general preventive health recommendations were provided to patient.   Due to this being a telephonic visit, the after visit summary with patients personalized plan was offered to  patient via mail or my-chart. Patient would like to access on my-chart.  Beatris Ship, [place]   10/19/2022   Nurse Notes: None

## 2022-10-19 NOTE — Patient Instructions (Signed)
[name] , Thank you for taking time to come for your Medicare Wellness Visit. I appreciate your ongoing commitment to your health goals. Please review the following plan we discussed and let me know if I can assist you in the future.     This is a list of the screening recommended for you and due dates:  Health Maintenance  Topic Date Due   COVID-19 Vaccine (6 - 2023-24 season) 07/29/2022   Medicare Annual Wellness Visit  10/19/2023   Mammogram  08/31/2024   DTaP/Tdap/Td vaccine (3 - Tdap) 04/27/2025   Colon Cancer Screening  12/01/2026   Pneumonia Vaccine  Completed   Flu Shot  Completed   DEXA scan (bone density measurement)  Completed   Hepatitis C Screening: USPSTF Recommendation to screen - Ages 73-79 yo.  Completed   Zoster (Shingles) Vaccine  Completed   HPV Vaccine  Aged Out     Next appointment: Follow up in one year for your annual wellness visit.   Preventive Care 62 Years and Older, Female Preventive care refers to lifestyle choices and visits with your health care provider that can promote health and wellness. What does preventive care include? A yearly physical exam. This is also called an annual well check. Dental exams once or twice a year. Routine eye exams. Ask your health care provider how often you should have your eyes checked. Personal lifestyle choices, including: Daily care of your teeth and gums. Regular physical activity. Eating a healthy diet. Avoiding tobacco and drug use. Limiting alcohol use. Practicing safe sex. Taking low-dose aspirin every day. Taking vitamin and mineral supplements as recommended by your health care provider. What happens during an annual well check? The services and screenings done by your health care provider during your annual well check will depend on your age, overall health, lifestyle risk factors, and family history of disease. Counseling  Your health care provider may ask you questions about your: Alcohol  use. Tobacco use. Drug use. Emotional well-being. Home and relationship well-being. Sexual activity. Eating habits. History of falls. Memory and ability to understand (cognition). Work and work Statistician. Reproductive health. Screening  You may have the following tests or measurements: Height, weight, and BMI. Blood pressure. Lipid and cholesterol levels. These may be checked every 5 years, or more frequently if you are over 73 years old. Skin check. Lung cancer screening. You may have this screening every year starting at age 73 if you have a 30-pack-year history of smoking and currently smoke or have quit within the past 15 years. Fecal occult blood test (FOBT) of the stool. You may have this test every year starting at age 73. Flexible sigmoidoscopy or colonoscopy. You may have a sigmoidoscopy every 5 years or a colonoscopy every 10 years starting at age 73. Hepatitis C blood test. Hepatitis B blood test. Sexually transmitted disease (STD) testing. Diabetes screening. This is done by checking your blood sugar (glucose) after you have not eaten for a while (fasting). You may have this done every 1-3 years. Bone density scan. This is done to screen for osteoporosis. You may have this done starting at age 73. Mammogram. This may be done every 1-2 years. Talk to your health care provider about how often you should have regular mammograms. Talk with your health care provider about your test results, treatment options, and if necessary, the need for more tests. Vaccines  Your health care provider may recommend certain vaccines, such as: Influenza vaccine. This is recommended every year. Tetanus,  diphtheria, and acellular pertussis (Tdap, Td) vaccine. You may need a Td booster every 10 years. Zoster vaccine. You may need this after age 73. Pneumococcal 13-valent conjugate (PCV13) vaccine. One dose is recommended after age 73. Pneumococcal polysaccharide (PPSV23) vaccine. One dose is  recommended after age 73. Talk to your health care provider about which screenings and vaccines you need and how often you need them. This information is not intended to replace advice given to you by your health care provider. Make sure you discuss any questions you have with your health care provider. Document Released: 08/07/2015 Document Revised: 03/30/2016 Document Reviewed: 05/12/2015 Elsevier Interactive Patient Education  2017 [employer] Prevention in the Home Falls can cause injuries. They can happen to people of all ages. There are many things you can do to make your home safe and to help prevent falls. What can I do on the outside of my home? Regularly fix the edges of walkways and driveways and fix any cracks. Remove anything that might make you trip as you walk through a door, such as a raised step or threshold. Trim any bushes or trees on the path to your home. Use bright outdoor lighting. Clear any walking paths of anything that might make someone trip, such as rocks or tools. Regularly check to see if handrails are loose or broken. Make sure that both sides of any steps have handrails. Any raised decks and porches should have guardrails on the edges. Have any leaves, snow, or ice cleared regularly. Use sand or salt on walking paths during winter. Clean up any spills in your garage right away. This includes oil or grease spills. What can I do in the bathroom? Use night lights. Install grab bars by the toilet and in the tub and shower. Do not use towel bars as grab bars. Use non-skid mats or decals in the tub or shower. If you need to sit down in the shower, use a plastic, non-slip stool. Keep the floor dry. Clean up any water that spills on the floor as soon as it happens. Remove soap buildup in the tub or shower regularly. Attach bath mats securely with double-sided non-slip rug tape. Do not have throw rugs and other things on the floor that can make you  trip. What can I do in the bedroom? Use night lights. Make sure that you have a light by your bed that is easy to reach. Do not use any sheets or blankets that are too big for your bed. They should not hang down onto the floor. Have a firm chair that has side arms. You can use this for support while you get dressed. Do not have throw rugs and other things on the floor that can make you trip. What can I do in the kitchen? Clean up any spills right away. Avoid walking on wet floors. Keep items that you use a lot in easy-to-reach places. If you need to reach something above you, use a strong step stool that has a grab bar. Keep electrical cords out of the way. Do not use floor polish or wax that makes floors slippery. If you must use wax, use non-skid floor wax. Do not have throw rugs and other things on the floor that can make you trip. What can I do with my stairs? Do not leave any items on the stairs. Make sure that there are handrails on both sides of the stairs and use them. Fix handrails that are broken or loose. Make  sure that handrails are as long as the stairways. Check any carpeting to make sure that it is firmly attached to the stairs. Fix any carpet that is loose or worn. Avoid having throw rugs at the top or bottom of the stairs. If you do have throw rugs, attach them to the floor with carpet tape. Make sure that you have a light switch at the top of the stairs and the bottom of the stairs. If you do not have them, ask someone to add them for you. What else can I do to help prevent falls? Wear shoes that: Do not have high heels. Have rubber bottoms. Are comfortable and fit you well. Are closed at the toe. Do not wear sandals. If you use a stepladder: Make sure that it is fully opened. Do not climb a closed stepladder. Make sure that both sides of the stepladder are locked into place. Ask someone to hold it for you, if possible. Clearly mark and make sure that you can  see: Any grab bars or handrails. First and last steps. Where the edge of each step is. Use tools that help you move around (mobility aids) if they are needed. These include: Canes. Walkers. Scooters. Crutches. Turn on the lights when you go into a dark area. Replace any light bulbs as soon as they burn out. Set up your furniture so you have a clear path. Avoid moving your furniture around. If any of your floors are uneven, fix them. If there are any pets around you, be aware of where they are. Review your medicines with your doctor. Some medicines can make you feel dizzy. This can increase your chance of falling. Ask your doctor what other things that you can do to help prevent falls. This information is not intended to replace advice given to you by your health care provider. Make sure you discuss any questions you have with your health care provider. Document Released: 05/07/2009 Document Revised: 12/17/2015 Document Reviewed: 08/15/2014 Elsevier Interactive Patient Education  2017 [employer].

## 2022-12-28 ENCOUNTER — Other Ambulatory Visit: Payer: Self-pay | Admitting: Family

## 2022-12-28 DIAGNOSIS — R69 Illness, unspecified: Secondary | ICD-10-CM | POA: Diagnosis not present

## 2023-01-11 ENCOUNTER — Ambulatory Visit: Payer: Medicare HMO | Admitting: Family

## 2023-01-18 ENCOUNTER — Ambulatory Visit (INDEPENDENT_AMBULATORY_CARE_PROVIDER_SITE_OTHER): Payer: Medicare HMO | Admitting: Family

## 2023-01-18 VITALS — BP 133/68 | HR 76 | Temp 98.2°F | Resp 16 | Wt 135.0 lb

## 2023-01-18 DIAGNOSIS — M19042 Primary osteoarthritis, left hand: Secondary | ICD-10-CM | POA: Diagnosis not present

## 2023-01-18 DIAGNOSIS — M19041 Primary osteoarthritis, right hand: Secondary | ICD-10-CM | POA: Diagnosis not present

## 2023-01-18 DIAGNOSIS — M810 Age-related osteoporosis without current pathological fracture: Secondary | ICD-10-CM

## 2023-01-18 DIAGNOSIS — E7800 Pure hypercholesterolemia, unspecified: Secondary | ICD-10-CM

## 2023-01-18 DIAGNOSIS — H8110 Benign paroxysmal vertigo, unspecified ear: Secondary | ICD-10-CM | POA: Diagnosis not present

## 2023-01-18 MED ORDER — PRAVASTATIN SODIUM 20 MG PO TABS
20.0000 mg | ORAL_TABLET | Freq: Every day | ORAL | 1 refills | Status: DC
Start: 2023-01-18 — End: 2023-10-10

## 2023-01-18 NOTE — Assessment & Plan Note (Signed)
" >>  ASSESSMENT AND PLAN FOR HYPERCHOLESTEROLEMIA WRITTEN ON 01/18/2023  9:34 AM BY O'SULLIVAN, Zaray Gatchel, NP  Stable on Pravastatin  20mg  daily. Last cholesterol check around Christmas time was within normal limits. -Continue Pravastatin  20mg  daily. -Refill Pravastatin  prescription. Lab Results  Component Value Date   CHOL 196 07/13/2022   HDL 53.00 07/13/2022   LDLCALC 127 (H) 07/13/2022   LDLDIRECT 109.0 06/25/2019   TRIG 81.0 07/13/2022   CHOLHDL 4 07/13/2022    "

## 2023-01-18 NOTE — Patient Instructions (Signed)
VISIT SUMMARY:  Dear Jocelyn Morgan, thank you for coming in for your routine follow-up. We discussed your ongoing conditions including hyperlipidemia, vertigo, arthritis, osteoporosis, and diverticulitis. You reported occasional vertigo, stable arthritis, and no recent issues with diverticulitis.  We reviewed your current medications and discussed your general health maintenance.  YOUR PLAN:  -HYPERLIPIDEMIA: Hyperlipidemia is a condition where there are high levels of fats in your blood. Your condition is stable and your cholesterol levels are within normal limits. Please continue taking Pravastatin 20mg  daily and refill your prescription as needed.  -VERTIGO: Vertigo is a sensation of feeling off balance, often triggered by sudden movements. Your symptoms are mild and infrequent. Continue managing your symptoms as you have been and be cautious with sudden movements.  -ARTHRITIS: Arthritis is inflammation of one or more of your joints causing pain and stiffness. Your condition is stable with occasional aches and pains. Continue using Tylenol as needed for pain.  -OSTEOPOROSIS: Osteoporosis is a condition that weakens bones, making them fragile and more likely to break. You are currently on Fosamax weekly. Continue this medication and plan for your next bone density check in 2025.  -DIVERTICULITIS: Diverticulitis is inflammation or infection of small pouches called diverticula that develop along the walls of the intestines. You have not had any recent specific issues. Continue managing your symptoms as you have been.  -GENERAL HEALTH MAINTENANCE: You are up to date on preventive care including mammogram and are taking a Vitamin D supplement. We discussed the upcoming need for flu and COVID vaccines in the fall and the new RSV vaccine recommendation for individuals over 60. Continue taking your Vitamin D supplement and plan for your vaccines in the fall. Consider getting the RSV vaccine at your  pharmacy.  INSTRUCTIONS:  Your next appointment is in approximately 6 months (around December 2024) with labs. Please continue to monitor your symptoms and reach out if you have any concerns or changes in your condition.

## 2023-01-18 NOTE — Assessment & Plan Note (Signed)
Occasional mild symptoms, particularly with sudden movements. No recent severe episodes. -Continue current management and caution with sudden movements.

## 2023-01-18 NOTE — Assessment & Plan Note (Addendum)
Stable with occasional aches and pains. Using Tylenol for symptom management, particularly before bed. -Continue Tylenol as needed for pain. 

## 2023-01-18 NOTE — Progress Notes (Signed)
Subjective:     Patient ID: Jocelyn Morgan, female    DOB: [birth year], 72 y.o.   MRN: [id number]  Chief Complaint  Patient presents with   Osteoporosis    Here for follow up   Hyperlipidemia    Here for follow up   Hip Pain    Patient reports having left hip pain    Hyperlipidemia  Hip Pain     Discussed the use of AI scribe software for clinical note transcription with the patient, who gave verbal consent to proceed.  History of Present Illness   Jocelyn Morgan, a patient with a history of hyperlipidemia, vertigo, arthritis, osteoporosis, and diverticulitis, presents for a routine follow-up. She reports occasional vertigo, particularly when turning her head quickly, but notes it is not as severe as previous episodes. Her arthritis is stable, with occasional aches and pains managed with Tylenol. She has been experiencing difficulty sleeping and has switched from Benadryl to Tylenol at night due to concerns about cognitive fogging. She continues to take Fosamax weekly for osteoporosis and pravastatin 20mg  for hyperlipidemia. She has not had any recent issues with diverticulitis.      Lab Results  Component Value Date   CHOL 196 07/13/2022   HDL 53.00 07/13/2022   LDLCALC 127 (H) 07/13/2022   LDLDIRECT 109.0 06/25/2019   TRIG 81.0 07/13/2022   CHOLHDL 4 07/13/2022       Health Maintenance Due  Topic Date Due   COVID-19 Vaccine (6 - 2023-24 season) 07/29/2022    Past Medical History:  Diagnosis Date   Allergy 03/06/2021   see chart   Arthritis    osteoarthritis   GERD (gastroesophageal reflux disease) Fall of 2023   Hyperlipidemia    Liver hemangioma 03/16/2011   Postmenopausal HRT (hormone replacement therapy)     Past Surgical History:  Procedure Laterality Date   EYE SURGERY Bilateral 02/2022   EYE SURGERY  1954   strabismus   JOINT REPLACEMENT  03/05/2021   right hip   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   Procedure: RIGHT TOTAL HIP ARTHROPLASTY  ANTERIOR APPROACH;  Surgeon: Kathryne Hitch, MD;  Location: [facility];  Service: Orthopedics;  Laterality: Right;   TUBAL LIGATION      Family History  Problem Relation Age of Onset   Cancer Mother 48       breast   Allergic rhinitis Mother    Urticaria Mother    COPD Father        lived to be 22   Bladder Cancer Father    Ulcerative colitis Brother    CAD Brother    Prostate cancer Brother    Cancer Cousin 1       Stage 3 breast cancer   Arthritis Other    Diabetes Other    Cancer Other        bladder   Vitiligo Son     Social History   Socioeconomic History   Marital status: Married    Spouse name: Not on file   Number of children: 4   Years of education: Not on file   Highest education level: Associate degree: occupational, Scientist, product/process development, or vocational program  Occupational History   Occupation: housewife  Tobacco Use   Smoking status: Never   Smokeless tobacco: Never  Vaping Use   Vaping Use: Never used  Substance and Sexual Activity   Alcohol use: Yes    Alcohol/week: 1.0 - 2.0 standard drink of alcohol    Types: 1 -  2 Glasses of wine per week    Comment: occasional   Drug use: No   Sexual activity: Yes  Other Topics Concern   Not on file  Social History Narrative   Exercise: sometimes   Caffeine: 2 cups 1/2 and 1/2 coffee daily   6 grandchildren (none of her children are in [facility])   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          Social Determinants of Health   Financial Resource Strain: Low Risk  (01/16/2023)   Overall Financial Resource Strain (CARDIA)    Difficulty of Paying Living Expenses: Not hard at all  Food Insecurity: No Food Insecurity (01/16/2023)   Hunger Vital Sign    Worried About Running Out of Food in the Last Year: Never true    Ran Out of Food in the Last Year: Never true  Transportation Needs: No Transportation Needs (01/16/2023)   PRAPARE - Administrator, Civil Service (Medical): No    Lack of Transportation (Non-Medical):  No  Physical Activity: Sufficiently Active (01/16/2023)   Exercise Vital Sign    Days of Exercise per Week: 5 days    Minutes of Exercise per Session: 30 min  Recent Concern: Physical Activity - Insufficiently Active (10/18/2022)   Exercise Vital Sign    Days of Exercise per Week: 4 days    Minutes of Exercise per Session: 30 min  Stress: No Stress Concern Present (01/16/2023)   [employer] of Occupational Health - Occupational Stress Questionnaire    Feeling of Stress : Not at all  Social Connections: Socially Integrated (01/16/2023)   Social Connection and Isolation Panel [NHANES]    Frequency of Communication with Friends and Family: More than three times a week    Frequency of Social Gatherings with Friends and Family: Once a week    Attends Religious Services: More than 4 times per year    Active Member of [employer] or Organizations: Yes    Attends Engineer, structural: More than 4 times per year    Marital Status: Married  Catering manager Violence: Not At Risk (10/19/2022)   Humiliation, Afraid, Rape, and Kick questionnaire    Fear of Current or Ex-Partner: No    Emotionally Abused: No    Physically Abused: No    Sexually Abused: No    Outpatient Medications Prior to Visit  Medication Sig Dispense Refill   acetaminophen (TYLENOL) 650 MG CR tablet Take 650 mg by mouth every 8 (eight) hours as needed for pain.     alendronate (FOSAMAX) 70 MG tablet Take 1 tablet (70 mg total) by mouth once a week. Take with full glass of water on empty stomach 12 tablet 3   Biotin 10 MG/ML LIQD Take by mouth.     Calcium Carb-Cholecalciferol (CALCIUM 600 + D PO) Take 1 tablet by mouth in the morning and at bedtime.     cholecalciferol (VITAMIN D3) 25 MCG (1000 UNIT) tablet Take 1,000 Units by mouth daily.     Emollient (COLLAGEN EX) Apply topically.     meclizine (ANTIVERT) 25 MG tablet Take 1 tablet (25 mg total) by mouth 3 (three) times daily as needed for dizziness. 30 tablet 0    Misc Natural Products (JOINT HEALTH PO) Take 1 tablet by mouth daily.     Multiple Vitamin (MULTIVITAMIN WITH MINERALS) TABS tablet Take 1 tablet by mouth daily.     pravastatin (PRAVACHOL) 20 MG tablet TAKE ONE TABLET BY MOUTH DAILY 90 tablet 1  No facility-administered medications prior to visit.    Allergies  Allergen Reactions   Demerol [Meperidine Hcl] Nausea And Vomiting    In combination with versed caused severe n/v and headaches    Versed [Midazolam] Nausea And Vomiting    In combination with demerol caused severe n/v and headaches     ROS    See HPI Objective:    Physical Exam Constitutional:      General: She is not in acute distress.    Appearance: Normal appearance. She is well-developed.  HENT:     Head: Normocephalic and atraumatic.     Right Ear: External ear normal.     Left Ear: External ear normal.  Eyes:     General: No scleral icterus. Neck:     Thyroid: No thyromegaly.  Cardiovascular:     Rate and Rhythm: Normal rate and regular rhythm.     Heart sounds: Normal heart sounds. No murmur heard. Pulmonary:     Effort: Pulmonary effort is normal. No respiratory distress.     Breath sounds: Normal breath sounds. No wheezing.  Musculoskeletal:     Cervical back: Neck supple.  Skin:    General: Skin is warm and dry.  Neurological:     Mental Status: She is alert and oriented to person, place, and time.  Psychiatric:        Mood and Affect: Mood normal.        Behavior: Behavior normal.        Thought Content: Thought content normal.        Judgment: Judgment normal.      BP 133/68 (BP Location: Right Arm, Patient Position: Sitting, Cuff Size: Small)   Pulse 76   Temp 98.2 F (36.8 C) (Oral)   Resp 16   Wt 135 lb (61.2 kg)   LMP 07/25/1998   SpO2 97%   BMI 24.69 kg/m  Wt Readings from Last 3 Encounters:  01/18/23 135 lb (61.2 kg)  07/13/22 133 lb 9.6 oz (60.6 kg)  01/10/22 131 lb (59.4 kg)       Assessment & Plan:   Problem List  Items Addressed This Visit       Unprioritized   Osteoporosis    Stable with occasional aches and pains. Using Tylenol for symptom management, particularly before bed. -Continue Tylenol as needed for pain.      Osteoarthritis    Stable with occasional aches and pains. Using Tylenol for symptom management, particularly before bed. -Continue Tylenol as needed for pain.      HYPERCHOLESTEROLEMIA - Primary    Stable on Pravastatin 20mg  daily. Last cholesterol check around Christmas time was within normal limits. -Continue Pravastatin 20mg  daily. -Refill Pravastatin prescription. Lab Results  Component Value Date   CHOL 196 07/13/2022   HDL 53.00 07/13/2022   LDLCALC 127 (H) 07/13/2022   LDLDIRECT 109.0 06/25/2019   TRIG 81.0 07/13/2022   CHOLHDL 4 07/13/2022        Relevant Medications   pravastatin (PRAVACHOL) 20 MG tablet   Benign paroxysmal positional vertigo    Occasional mild symptoms, particularly with sudden movements. No recent severe episodes. -Continue current management and caution with sudden movements.      General Health Maintenance: Up to date on preventive care including mammogram. Taking Vitamin D supplement. Discussed upcoming need for flu and COVID vaccines in the fall. Discussed new RSV vaccine recommendation for individuals over 60. -Continue Vitamin D supplement. -Plan for flu and COVID vaccines in the fall. -Consider  RSV vaccine at pharmacy.   I have changed Jackelyn Poling. Kundinger's pravastatin. I am also having her maintain her Misc Natural Products (JOINT HEALTH PO), acetaminophen, multivitamin with minerals, Calcium Carb-Cholecalciferol (CALCIUM 600 + D PO), cholecalciferol, meclizine, alendronate, Biotin, and Emollient (COLLAGEN EX).  Meds ordered this encounter  Medications   pravastatin (PRAVACHOL) 20 MG tablet    Sig: Take 1 tablet (20 mg total) by mouth daily.    Dispense:  90 tablet    Refill:  1    Order Specific Question:   Supervising  Provider    Answer:   Danise Edge A [[id number]]

## 2023-01-18 NOTE — Assessment & Plan Note (Addendum)
Stable on Pravastatin 20mg  daily. Last cholesterol check around Christmas time was within normal limits. -Continue Pravastatin 20mg  daily. -Refill Pravastatin prescription. Lab Results  Component Value Date   CHOL 196 07/13/2022   HDL 53.00 07/13/2022   LDLCALC 127 (H) 07/13/2022   LDLDIRECT 109.0 06/25/2019   TRIG 81.0 07/13/2022   CHOLHDL 4 07/13/2022

## 2023-01-18 NOTE — Assessment & Plan Note (Signed)
Stable with occasional aches and pains. Using Tylenol for symptom management, particularly before bed. -Continue Tylenol as needed for pain.

## 2023-05-03 DIAGNOSIS — H524 Presbyopia: Secondary | ICD-10-CM | POA: Diagnosis not present

## 2023-05-03 DIAGNOSIS — H52222 Regular astigmatism, left eye: Secondary | ICD-10-CM | POA: Diagnosis not present

## 2023-05-05 IMAGING — CT CT ABD-PELV W/ CM
2 of 5 series · 17 of 46 positions shown, 19 images · IV contrast (Omnipaque)
Comparison: 6813

CLINICAL DATA: GI bleed

EXAM:
CT ABDOMEN AND PELVIS WITH CONTRAST
TECHNIQUE: Multidetector CT imaging of the abdomen and pelvis was performed
using the standard protocol following bolus administration of
intravenous contrast.
CONTRAST:  80mL OMNIPAQUE IOHEXOL 300 MG/ML  SOLN

[Series 2: axial (person_name) (person_name) · axial · 0.79mm/px · z∈[+867,+1237]mm · 14 of 84 slices shown, 16 images]
[im 5/84  soft-tissue]
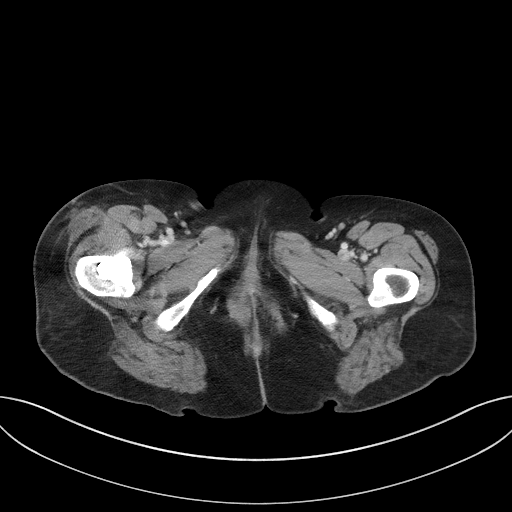
[im 5/84  bone]
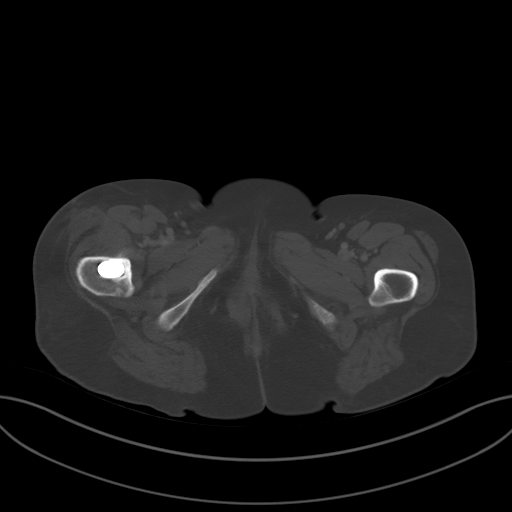
[im 10/84  soft-tissue]
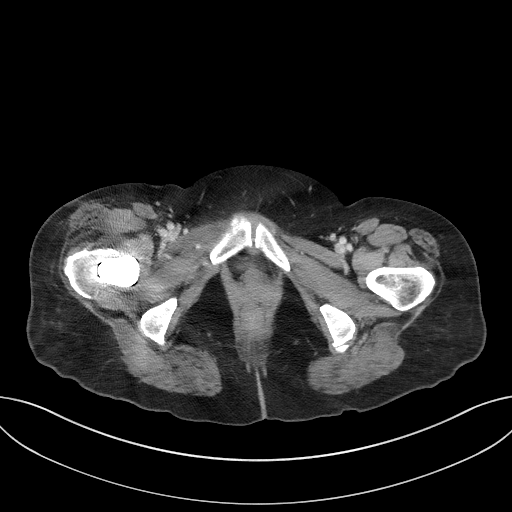
[im 19/84  soft-tissue]
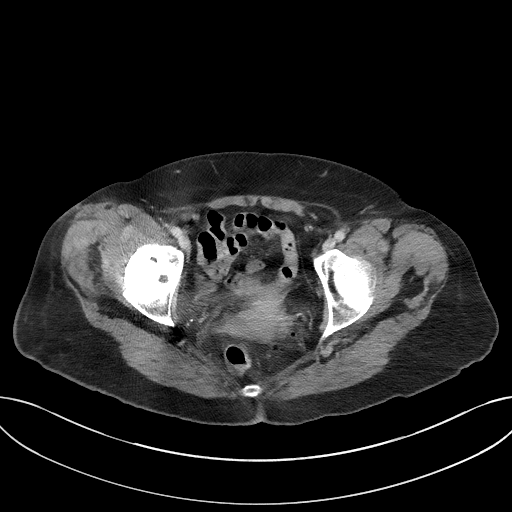
[im 24/84  soft-tissue]
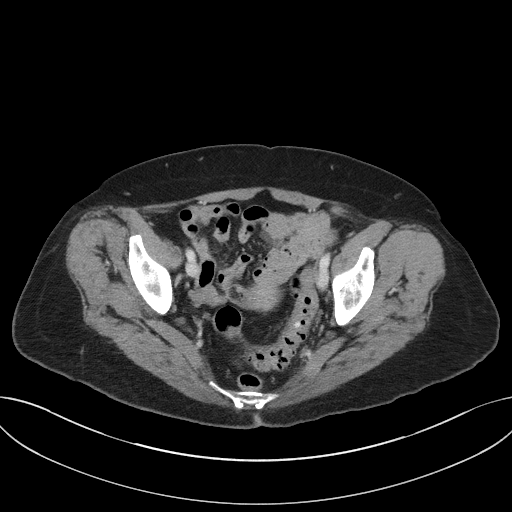
[im 28/84  soft-tissue]
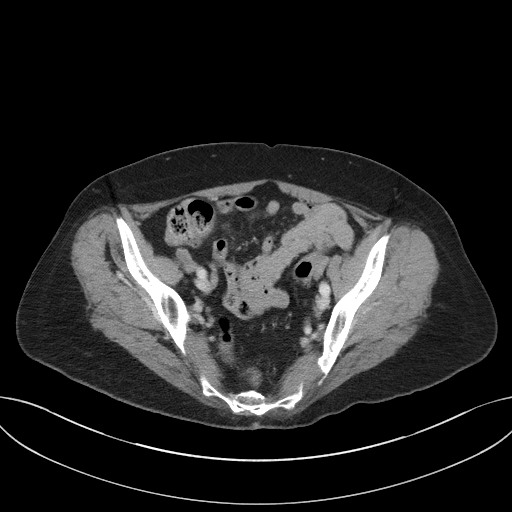
[im 33/84  soft-tissue]
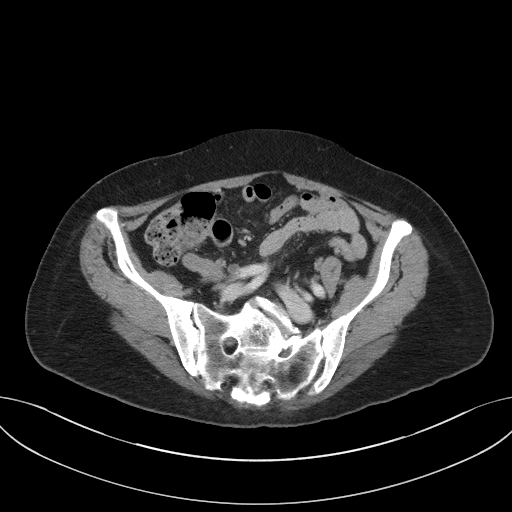
[im 37/84  soft-tissue]
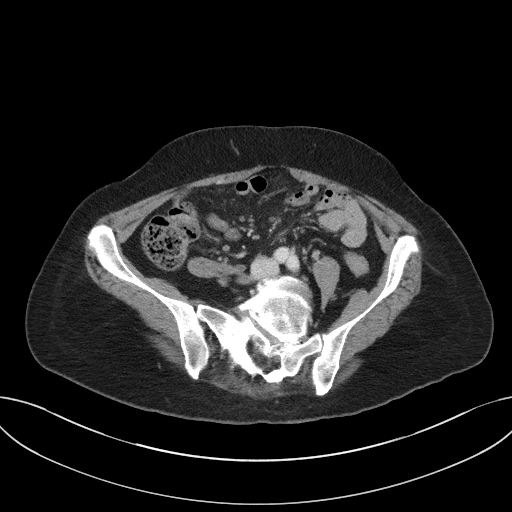
[im 47/84  soft-tissue]
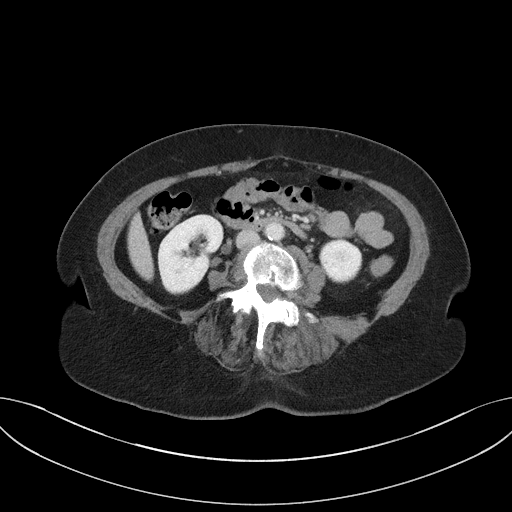
[im 51/84  soft-tissue]
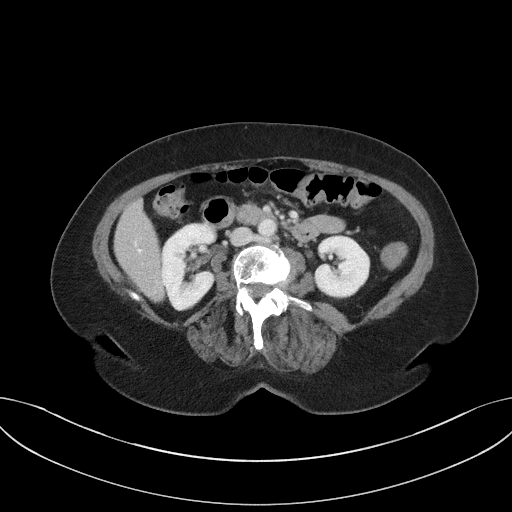
[im 51/84  bone]
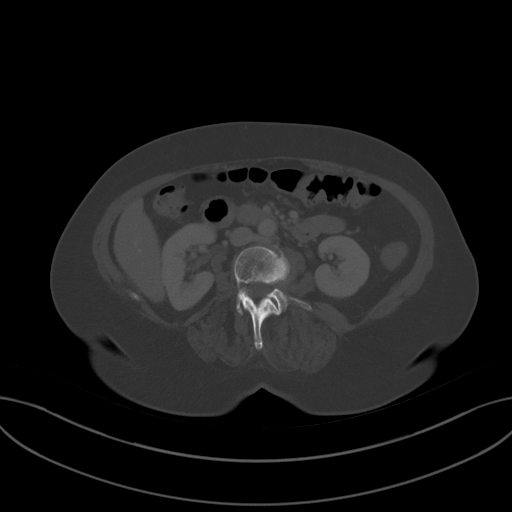
[im 56/84  soft-tissue]
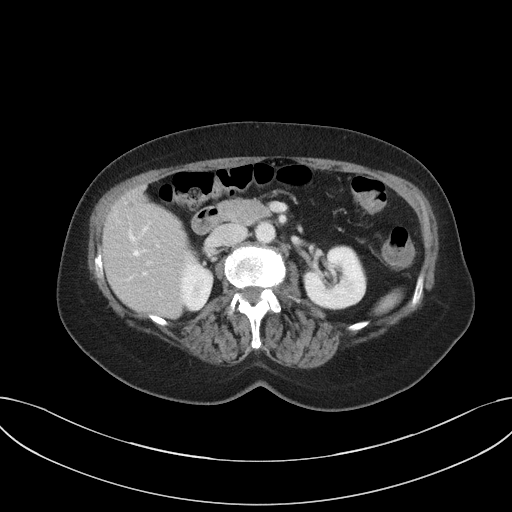
[im 60/84  soft-tissue]
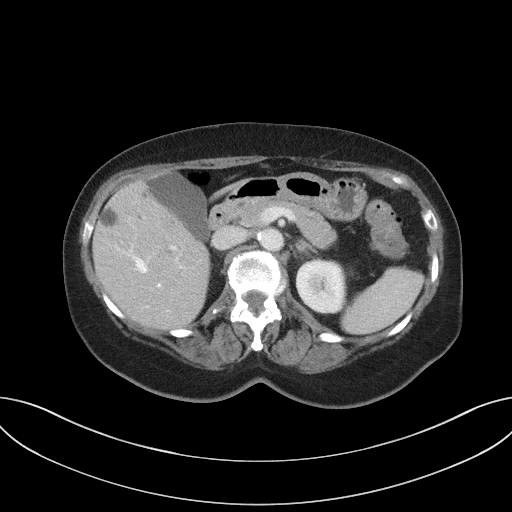
[im 65/84  soft-tissue]
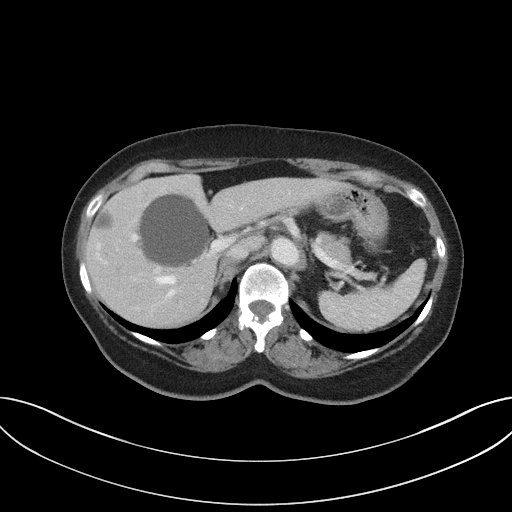
[im 74/84  soft-tissue]
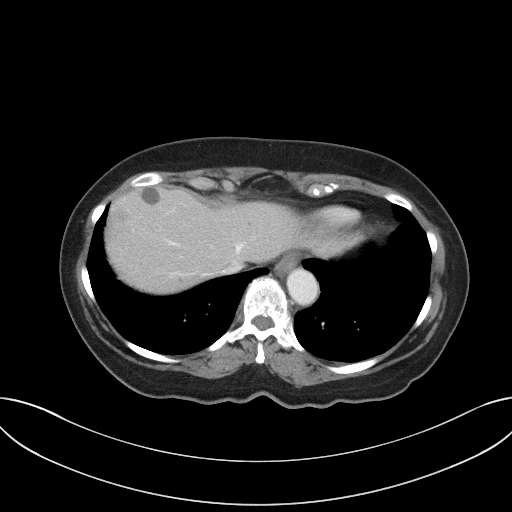
[im 79/84  soft-tissue]
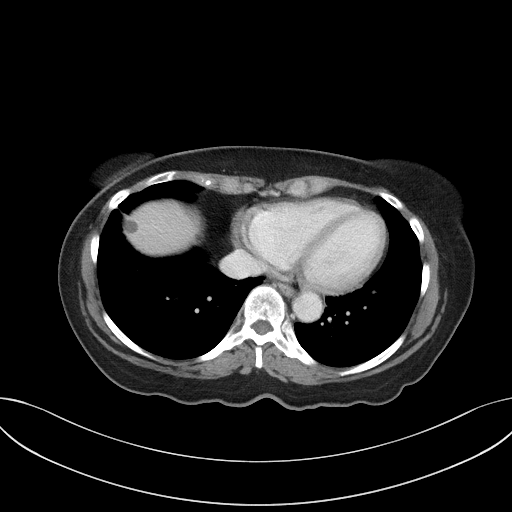

[Series 5: coronal st · coronal · 0.76mm/px · 3 of 85 slices shown]
[im 29/85  soft-tissue]
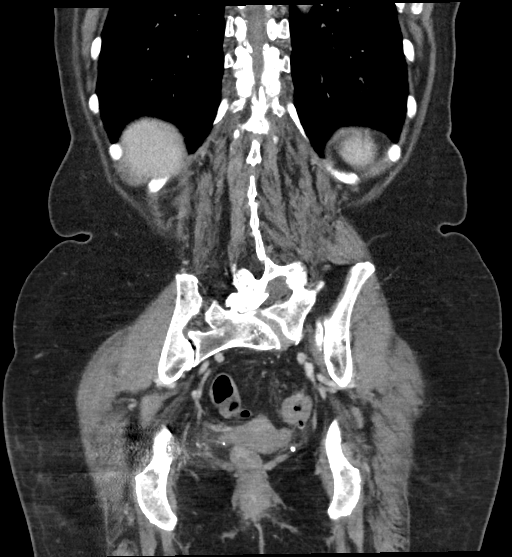
[im 38/85  soft-tissue]
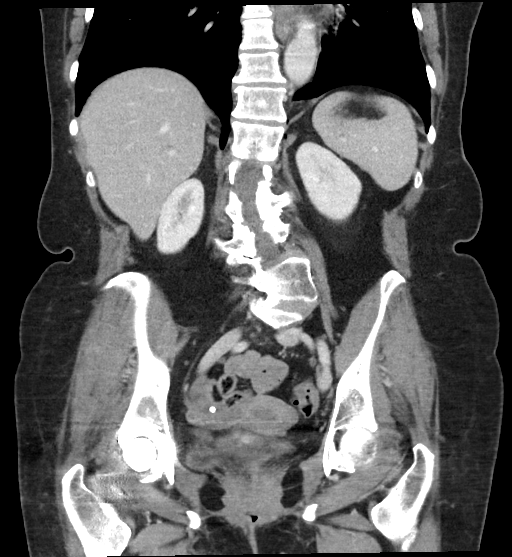
[im 47/85  soft-tissue]
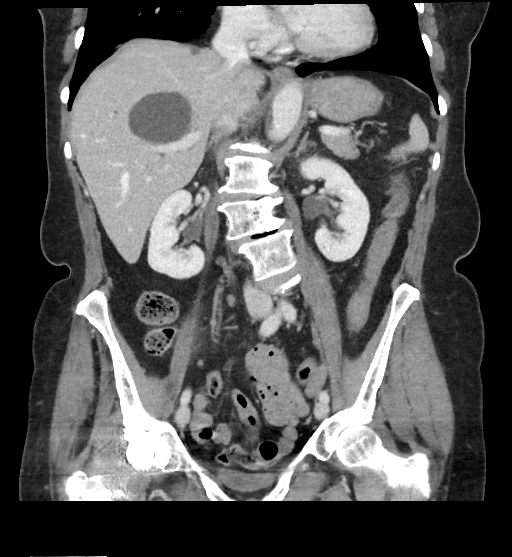

[17 of 46 positions shown; findings below may reference images not displayed]

FINDINGS: Lower chest: No acute abnormality.

Hepatobiliary: Multiple hepatic cysts are again identified.
Gallbladder is unremarkable. No biliary dilatation.

Pancreas: Unremarkable.

Spleen: Unremarkable.

Adrenals/Urinary Tract: Adrenals are unremarkable. Kidneys are
unremarkable. Bladder is poorly distended.

Stomach/Bowel: Stomach is within normal limits. Bowel is normal in
caliber. Distal colonic diverticulosis. Possible wall thickening at
the splenic flexure.

Vascular/Lymphatic: Aortic atherosclerosis. No enlarged lymph nodes.

Reproductive: Uterus and bilateral adnexa are unremarkable.

Other: No free fluid.  Abdominal wall is unremarkable.

Musculoskeletal: Sigmoid scoliosis. Degenerative changes. Right
total hip arthroplasty with associated streak artifact.
IMPRESSION: Colonic diverticulosis.

Possible wall thickening at the splenic flexure. This may reflect
underdistention as there is no oral contrast. Colitis including
ischemic colitis is a consideration. Aortic atherosclerosis is
present but there is no apparent significant origin stenosis of the
SMA or IMA.

## 2023-06-27 DIAGNOSIS — H524 Presbyopia: Secondary | ICD-10-CM | POA: Diagnosis not present

## 2023-06-27 DIAGNOSIS — Z0100 Encounter for examination of eyes and vision without abnormal findings: Secondary | ICD-10-CM | POA: Diagnosis not present

## 2023-06-27 DIAGNOSIS — H52222 Regular astigmatism, left eye: Secondary | ICD-10-CM | POA: Diagnosis not present

## 2023-07-28 ENCOUNTER — Ambulatory Visit: Payer: Medicare HMO | Admitting: Family

## 2023-08-02 ENCOUNTER — Ambulatory Visit: Payer: Medicare HMO | Admitting: Family

## 2023-08-02 VITALS — BP 138/59 | HR 79 | Temp 98.2°F | Resp 16 | Ht 62.0 in | Wt 136.0 lb

## 2023-08-02 DIAGNOSIS — G4700 Insomnia, unspecified: Secondary | ICD-10-CM | POA: Diagnosis not present

## 2023-08-02 DIAGNOSIS — Z0000 Encounter for general adult medical examination without abnormal findings: Secondary | ICD-10-CM | POA: Diagnosis not present

## 2023-08-02 DIAGNOSIS — E7800 Pure hypercholesterolemia, unspecified: Secondary | ICD-10-CM

## 2023-08-02 DIAGNOSIS — Z1231 Encounter for screening mammogram for malignant neoplasm of breast: Secondary | ICD-10-CM

## 2023-08-02 DIAGNOSIS — Z780 Asymptomatic menopausal state: Secondary | ICD-10-CM | POA: Diagnosis not present

## 2023-08-02 DIAGNOSIS — K580 Irritable bowel syndrome with diarrhea: Secondary | ICD-10-CM

## 2023-08-02 DIAGNOSIS — R197 Diarrhea, unspecified: Secondary | ICD-10-CM | POA: Insufficient documentation

## 2023-08-02 LAB — COMPREHENSIVE METABOLIC PANEL
ALT: 12 U/L (ref 0–35)
AST: 17 U/L (ref 0–37)
Albumin: 4.8 g/dL (ref 3.5–5.2)
Alkaline Phosphatase: 44 U/L (ref 39–117)
BUN: 16 mg/dL (ref 6–23)
CO2: 29 meq/L (ref 19–32)
Calcium: 9.8 mg/dL (ref 8.4–10.5)
Chloride: 103 meq/L (ref 96–112)
Creatinine, Ser: 0.65 mg/dL (ref 0.40–1.20)
GFR: 87.25 mL/min (ref >60.00)
Glucose, Bld: 101 mg/dL — ABNORMAL HIGH (ref 70–99)
Potassium: 4.4 meq/L (ref 3.5–5.1)
Sodium: 140 meq/L (ref 135–145)
Total Bilirubin: 0.5 mg/dL (ref 0.2–1.2)
Total Protein: 7.1 g/dL (ref 6.0–8.3)

## 2023-08-02 LAB — LIPID PANEL
Cholesterol: 184 mg/dL (ref 0–200)
HDL: 50.7 mg/dL (ref >39.00)
LDL Cholesterol: 105 mg/dL — ABNORMAL HIGH (ref 0–99)
NonHDL: 133.48
Total CHOL/HDL Ratio: 4
Triglycerides: 144 mg/dL (ref 0.0–149.0)
VLDL: 28.8 mg/dL (ref 0.0–40.0)

## 2023-08-02 MED ORDER — AMITRIPTYLINE HCL 25 MG PO TABS: 25.0000 mg | ORAL_TABLET | Freq: Every day | ORAL | 2 refills | Status: DC

## 2023-08-02 MED ORDER — RSVPREF3 VAC RECOMB ADJUVANTED 120 MCG/0.5ML IM SUSR
0.5000 mL | Freq: Once | INTRAMUSCULAR | 0 refills | Status: AC
Start: 2023-08-02 — End: 2023-08-02

## 2023-08-02 NOTE — Patient Instructions (Signed)
 VISIT SUMMARY:  During your visit today, we discussed your ongoing insomnia, occasional bowel irregularities, and general health maintenance. We also reviewed your current medications and made some adjustments to better manage your symptoms.  YOUR PLAN:  -INSOMNIA: Insomnia is a condition where you have trouble staying asleep or wake up frequently during the night. To help manage this, we are starting you on Amitriptyline  to be taken at bedtime daily.  Please check in 3-4 weeks to see how you are responding to this medication and if you are tolerating it well.  -HYPERLIPIDEMIA: Hyperlipidemia is a condition where you have high levels of cholesterol in your blood. You are currently taking Pravastatin  to manage this. We will order labs to check your cholesterol and kidney function to ensure the medication is working effectively.  -IRRITABLE BOWEL SYNDROME: Irritable Bowel Syndrome (IBS) is a condition that affects your digestive system, causing symptoms like cramping and diarrhea. These symptoms may be triggered by certain foods, such as fatty foods and dairy. We recommend making dietary modifications to identify and avoid these potential triggers.  -GENERAL HEALTH MAINTENANCE: For your general health, we will order a mammogram and bone density tests to be scheduled after August 26, 2023. Additionally, we will send a prescription for the RSV vaccine to [facility] pharmacy.  INSTRUCTIONS:  Please follow up in 3-4 weeks to assess your response to Amitriptyline  for insomnia. Additionally, schedule your mammogram and bone density tests after August 26, 2023. Follow up in 6 months for a routine check-up unless any issues arise sooner.

## 2023-08-02 NOTE — Assessment & Plan Note (Signed)
 Continue pravastatin, update lipid panel.

## 2023-08-02 NOTE — Assessment & Plan Note (Signed)
  Chronic issue with difficulty maintaining sleep. Currently using Benadryl  as needed with some benefit but reports feeling foggy the next day. Melatonin previously tried but caused headaches.  -Start Amitriptyline  at bedtime daily. -Check in 3-4 weeks to assess response and tolerability.

## 2023-08-02 NOTE — Assessment & Plan Note (Signed)
" >>  ASSESSMENT AND PLAN FOR HYPERCHOLESTEROLEMIA WRITTEN ON 08/02/2023  9:57 AM BY O'SULLIVAN, Tomaz Janis, NP  Continue pravastatin , update lipid panel.  "

## 2023-08-02 NOTE — Assessment & Plan Note (Signed)
  Reports periods of cramping and diarrhea, possibly related to dietary triggers such as fatty foods and dairy.  -Consider dietary modifications to identify and avoid potential triggers.

## 2023-08-02 NOTE — Progress Notes (Signed)
 Subjective:     Patient ID: Jocelyn Morgan, female    DOB: 09-Jul-1951, 73 y.o.   MRN: [id number]  Chief Complaint  Patient presents with   Hyperlipidemia    Here for follow up   Insomnia    Patient reports trouble staying sleep, most nights.     HPI  Discussed the use of AI scribe software for clinical note transcription with the patient, who gave verbal consent to proceed.  History of Present Illness   The patient, with a history of right hip replacement, presents for a routine six-month follow-up. The primary concern is ongoing insomnia, characterized by difficulty maintaining sleep and frequent awakenings. The patient reports falling asleep without difficulty but wakes up after approximately three hours and struggles to return to sleep. During these awakenings, the patient engages in activities such as playing games on a tablet or reading to redirect her thoughts. The patient has tried counting backwards from a hundred, which sometimes helps. Over-the-counter remedies like Benadryl  have been used with some success, but the patient is reluctant to use it regularly due to morning grogginess. Melatonin has been tried but was discontinued due to associated headaches.  In addition to insomnia, the patient reports occasional bowel irregularities, characterized by frequent bowel movements and cramping in the morning. These symptoms seem to be triggered by fatty foods and possibly dairy products. The patient also mentions some concerns about a previously replaced hip, although no acute issues are reported at this time.          Health Maintenance Due  Topic Date Due   COVID-19 Vaccine (7 - 2024-25 season) 06/01/2023    Past Medical History:  Diagnosis Date   Allergy  03/06/2021   see chart   Arthritis    osteoarthritis   GERD (gastroesophageal reflux disease) Fall of 2023   Hyperlipidemia    Liver hemangioma 03/16/2011   Postmenopausal HRT (hormone replacement therapy)      Past Surgical History:  Procedure Laterality Date   EYE SURGERY Bilateral 02/2022   EYE SURGERY  1954   strabismus   JOINT REPLACEMENT  03/05/2021   right hip   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   Procedure: RIGHT TOTAL HIP ARTHROPLASTY ANTERIOR APPROACH;  Surgeon: Vernetta Lonni GRADE, MD;  Location: [facility];  Service: Orthopedics;  Laterality: Right;   TUBAL LIGATION      Family History  Problem Relation Age of Onset   Cancer Mother 29       breast   Allergic rhinitis Mother    Urticaria Mother    COPD Father        lived to be 57   Bladder Cancer Father    Ulcerative colitis Brother    CAD Brother    Prostate cancer Brother    Cancer Cousin 37       Stage 3 breast cancer   Arthritis Other    Diabetes Other    Cancer Other        bladder   Vitiligo Son     Social History   Socioeconomic History   Marital status: Married    Spouse name: Not on file   Number of children: 4   Years of education: Not on file   Highest education level: Associate degree: occupational, scientist, product/process development, or vocational program  Occupational History   Occupation: housewife  Tobacco Use   Smoking status: Never   Smokeless tobacco: Never  Vaping Use   Vaping status: Never Used  Substance and Sexual  Activity   Alcohol use: Yes    Alcohol/week: 1.0 - 2.0 standard drink of alcohol    Types: 1 - 2 Glasses of wine per week    Comment: occasional   Drug use: No   Sexual activity: Yes  Other Topics Concern   Not on file  Social History Narrative   Exercise: sometimes   Caffeine: 2 cups 1/2 and 1/2 coffee daily   6 grandchildren (none of her children are in [place])   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          Social Drivers of Health   Financial Resource Strain: Low Risk  (07/28/2023)   Overall Financial Resource Strain (CARDIA)    Difficulty of Paying Living Expenses: Not hard at all  Food Insecurity: No Food Insecurity (07/28/2023)   Hunger Vital Sign    Worried About Running Out of  Food in the Last Year: Never true    Ran Out of Food in the Last Year: Never true  Transportation Needs: No Transportation Needs (07/28/2023)   PRAPARE - Administrator, Civil Service (Medical): No    Lack of Transportation (Non-Medical): No  Physical Activity: Insufficiently Active (07/28/2023)   Exercise Vital Sign    Days of Exercise per Week: 4 days    Minutes of Exercise per Session: 30 min  Stress: No Stress Concern Present (07/28/2023)   [employer] of Occupational Health - Occupational Stress Questionnaire    Feeling of Stress : Only a little  Social Connections: Socially Integrated (07/28/2023)   Social Connection and Isolation Panel [NHANES]    Frequency of Communication with Friends and Family: More than three times a week    Frequency of Social Gatherings with Friends and Family: Once a week    Attends Religious Services: More than 4 times per year    Active Member of [employer] or Organizations: Yes    Attends Banker Meetings: 1 to 4 times per year    Marital Status: Married  Catering Manager Violence: Not At Risk (10/19/2022)   Humiliation, Afraid, Rape, and Kick questionnaire    Fear of Current or Ex-Partner: No    Emotionally Abused: No    Physically Abused: No    Sexually Abused: No    Outpatient Medications Prior to Visit  Medication Sig Dispense Refill   acetaminophen  (TYLENOL ) 650 MG CR tablet Take 650 mg by mouth every 8 (eight) hours as needed for pain.     alendronate  (FOSAMAX ) 70 MG tablet Take 1 tablet (70 mg total) by mouth once a week. Take with full glass of water on empty stomach 12 tablet 3   Calcium  Carb-Cholecalciferol  (CALCIUM  600 + D PO) Take 1 tablet by mouth in the morning and at bedtime.     cholecalciferol  (VITAMIN D3) 25 MCG (1000 UNIT) tablet Take 1,000 Units by mouth daily.     diphenhydrAMINE  (BENADRYL ) 25 mg capsule Take 25 mg by mouth every 6 (six) hours as needed.     meclizine  (ANTIVERT ) 25 MG tablet Take 1 tablet (25  mg total) by mouth 3 (three) times daily as needed for dizziness. 30 tablet 0   Misc Natural Products (JOINT HEALTH PO) Take 1 tablet by mouth daily.     Multiple Vitamin (MULTIVITAMIN WITH MINERALS) TABS tablet Take 1 tablet by mouth daily.     pravastatin  (PRAVACHOL ) 20 MG tablet Take 1 tablet (20 mg total) by mouth daily. 90 tablet 1   Biotin 10 MG/ML LIQD Take by  mouth.     Emollient (COLLAGEN EX) Apply topically.     No facility-administered medications prior to visit.    Allergies  Allergen Reactions   Demerol  [Meperidine  Hcl] Nausea And Vomiting    In combination with versed caused severe n/v and headaches    Versed [Midazolam] Nausea And Vomiting    In combination with demerol  caused severe n/v and headaches     ROS See HPI    Objective:    Physical Exam Constitutional:      General: She is not in acute distress.    Appearance: Normal appearance. She is well-developed.  HENT:     Head: Normocephalic and atraumatic.     Right Ear: External ear normal.     Left Ear: External ear normal.  Eyes:     General: No scleral icterus. Neck:     Thyroid : No thyromegaly.  Cardiovascular:     Rate and Rhythm: Normal rate and regular rhythm.     Heart sounds: Normal heart sounds. No murmur heard. Pulmonary:     Effort: Pulmonary effort is normal. No respiratory distress.     Breath sounds: Normal breath sounds. No wheezing.  Musculoskeletal:     Cervical back: Neck supple.  Skin:    General: Skin is warm and dry.  Neurological:     Mental Status: She is alert and oriented to person, place, and time.  Psychiatric:        Mood and Affect: Mood normal.        Behavior: Behavior normal.        Thought Content: Thought content normal.        Judgment: Judgment normal.      BP (!) 138/59 (BP Location: Right Arm, Patient Position: Sitting, Cuff Size: Small)   Pulse 79   Temp 98.2 F (36.8 C) (Oral)   Resp 16   Ht 5' 2 (1.575 m)   Wt 136 lb (61.7 kg)   LMP 07/25/1998    SpO2 97%   BMI 24.87 kg/m  Wt Readings from Last 3 Encounters:  08/02/23 136 lb (61.7 kg)  01/18/23 135 lb (61.2 kg)  07/13/22 133 lb 9.6 oz (60.6 kg)       Assessment & Plan:   Problem List Items Addressed This Visit       Unprioritized   Preventative health care - Primary   Relevant Medications   RSV vaccine recomb adjuvanted (AREXVY) 120 MCG/0.5ML injection   Insomnia    Chronic issue with difficulty maintaining sleep. Currently using Benadryl  as needed with some benefit but reports feeling foggy the next day. Melatonin previously tried but caused headaches.  -Start Amitriptyline  at bedtime daily. -Check in 3-4 weeks to assess response and tolerability.       Relevant Medications   amitriptyline  (ELAVIL ) 25 MG tablet   HYPERCHOLESTEROLEMIA   Continue pravastatin , update lipid panel.       Relevant Orders   Comp Met (CMET)   Lipid panel   Other Visit Diagnoses       Breast cancer screening by mammogram       Relevant Orders   MM 3D SCREENING MAMMOGRAM BILATERAL BREAST     Postmenopausal estrogen deficiency       Relevant Orders   DG Bone Density       I have discontinued Jocelyn HERO. Dina's Biotin and Emollient (COLLAGEN EX). I am also having her start on amitriptyline  and RSV vaccine recomb adjuvanted. Additionally, I am having her maintain her Misc  Natural Products (JOINT HEALTH PO), acetaminophen , multivitamin with minerals, Calcium  Carb-Cholecalciferol  (CALCIUM  600 + D PO), cholecalciferol , meclizine , alendronate , pravastatin , and diphenhydrAMINE .  Meds ordered this encounter  Medications   amitriptyline  (ELAVIL ) 25 MG tablet    Sig: Take 1 tablet (25 mg total) by mouth at bedtime.    Dispense:  30 tablet    Refill:  2    Supervising Provider:   DOMENICA BLACKBIRD A [[id number]]   RSV vaccine recomb adjuvanted (AREXVY) 120 MCG/0.5ML injection    Sig: Inject 0.5 mLs into the muscle once for 1 dose.    Dispense:  0.5 mL    Refill:  0    Supervising Provider:    DOMENICA BLACKBIRD A [[id number]]

## 2023-08-23 IMAGING — MG MM DIGITAL SCREENING BILAT W/ TOMO AND CAD
8 series · 8 of 24 positions shown · non-contrast
Comparison: Previous exam(s).

CLINICAL DATA: Screening.

EXAM:
DIGITAL SCREENING BILATERAL MAMMOGRAM WITH TOMOSYNTHESIS AND CAD
TECHNIQUE: Bilateral screening digital craniocaudal and mediolateral oblique
mammograms were obtained. Bilateral screening digital breast
tomosynthesis was performed. The images were evaluated with
computer-aided detection.

[L CC synth-2D]
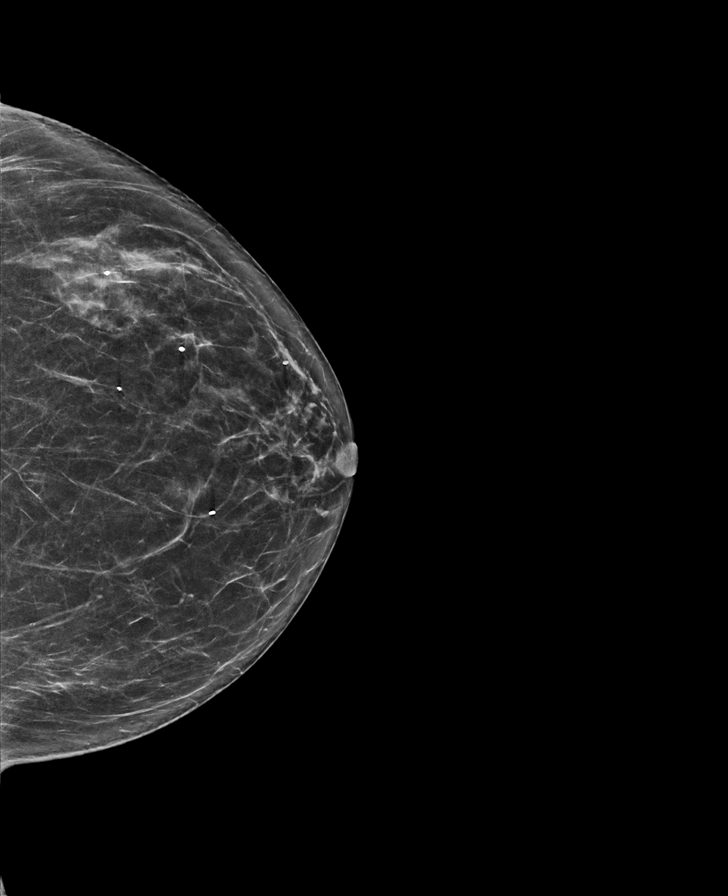

[L MLO synth-2D]
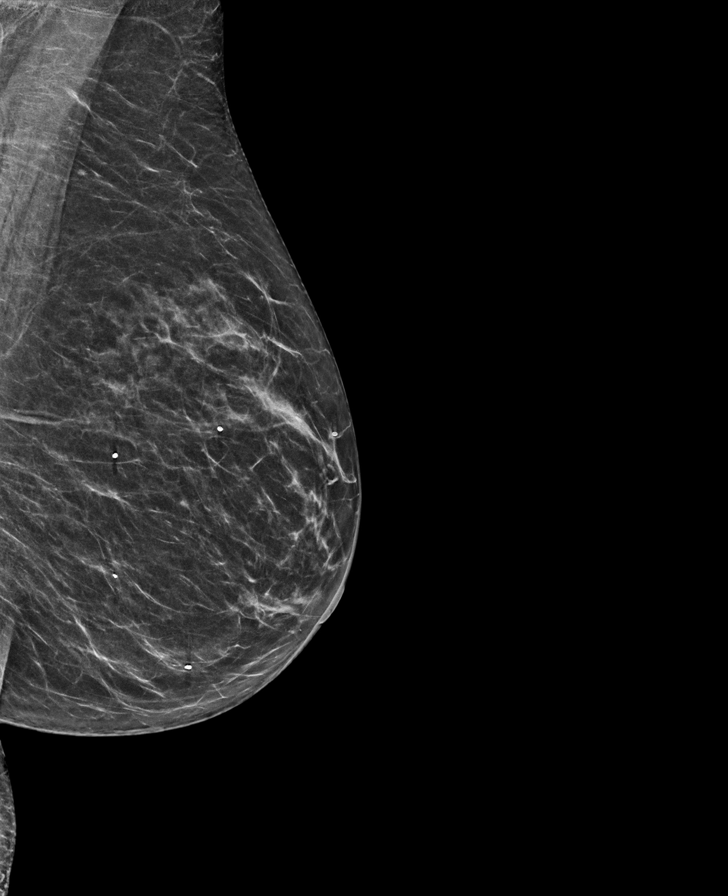

[R MLO synth-2D]
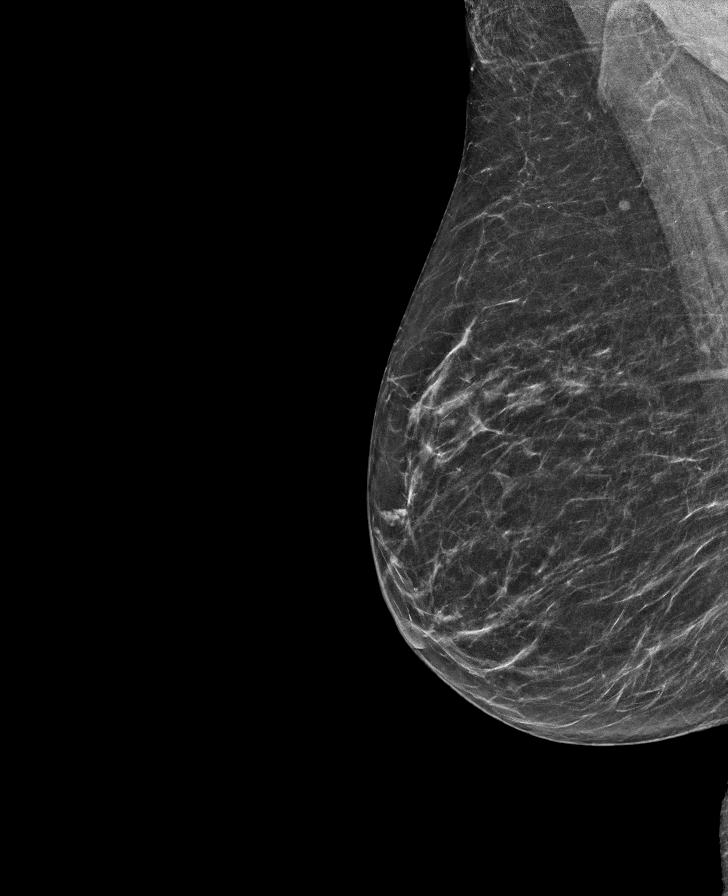

[R CC synth-2D]
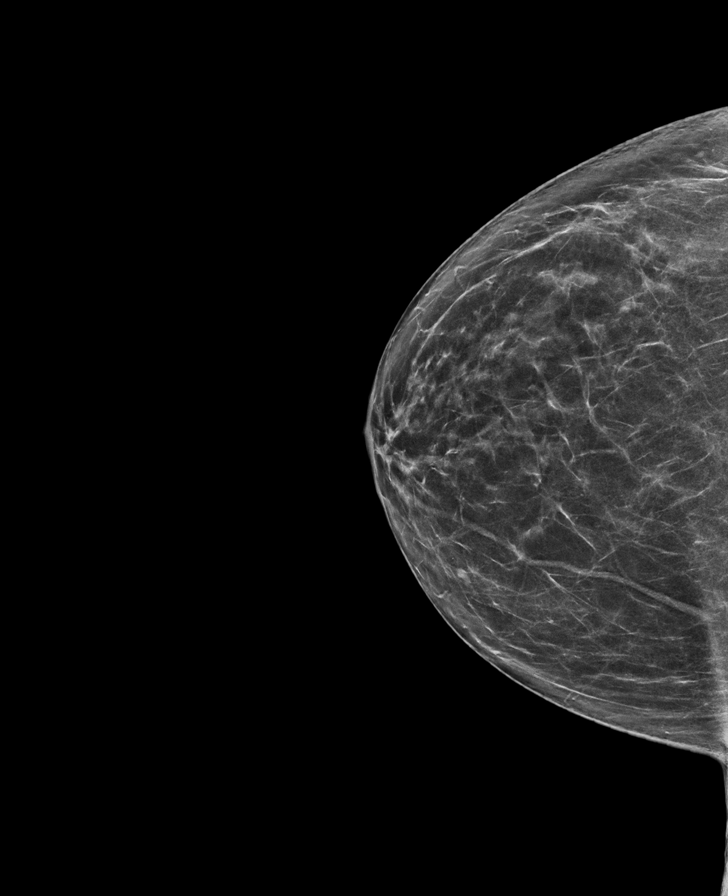

[L CC tomo · tomo slice 33/64.0]
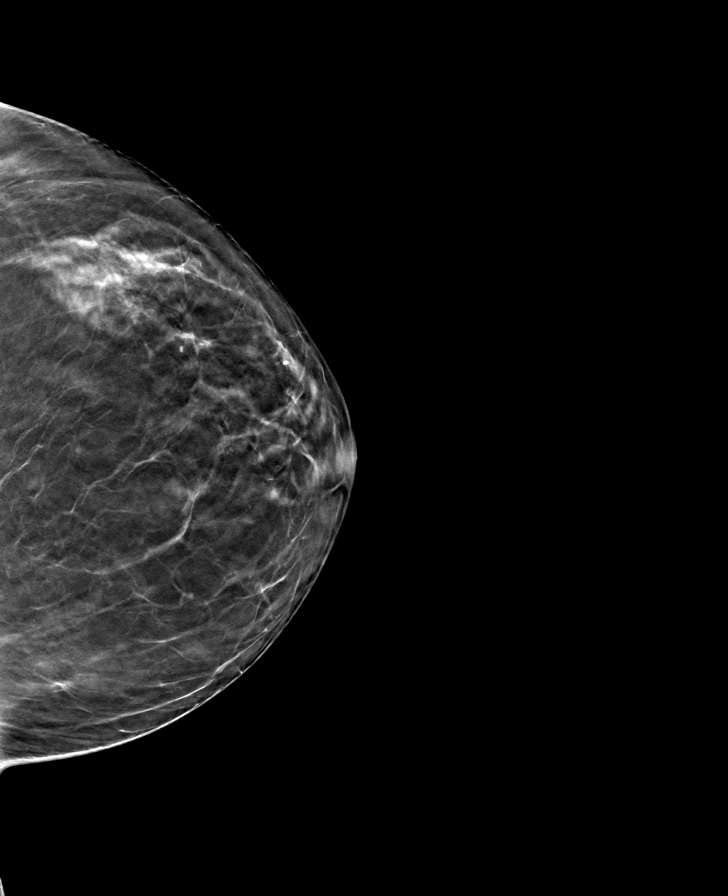

[R CC tomo · tomo slice 33/66.0]
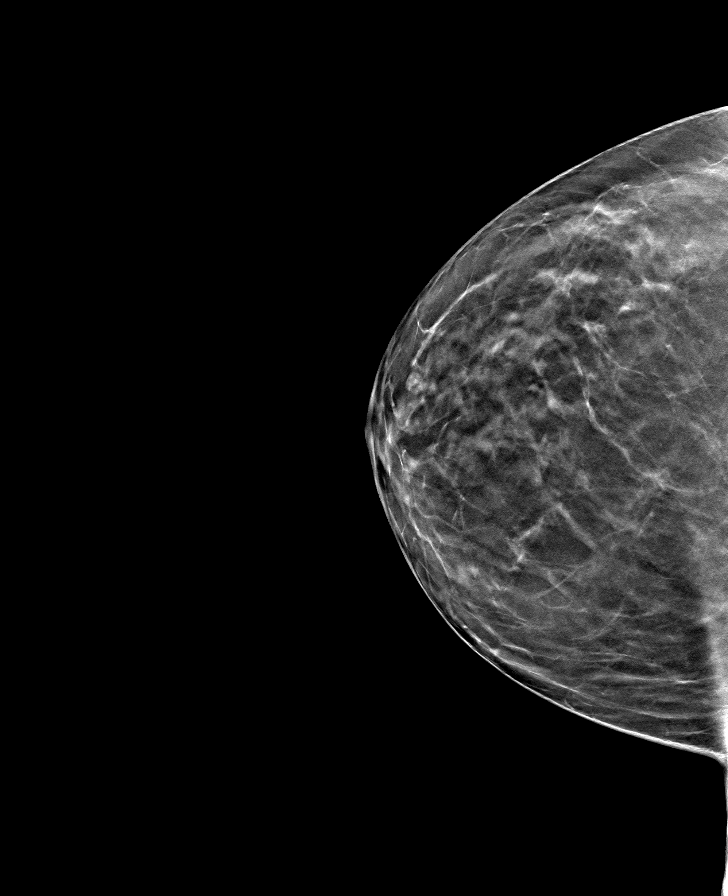

[L MLO tomo · tomo slice 31/61.0]
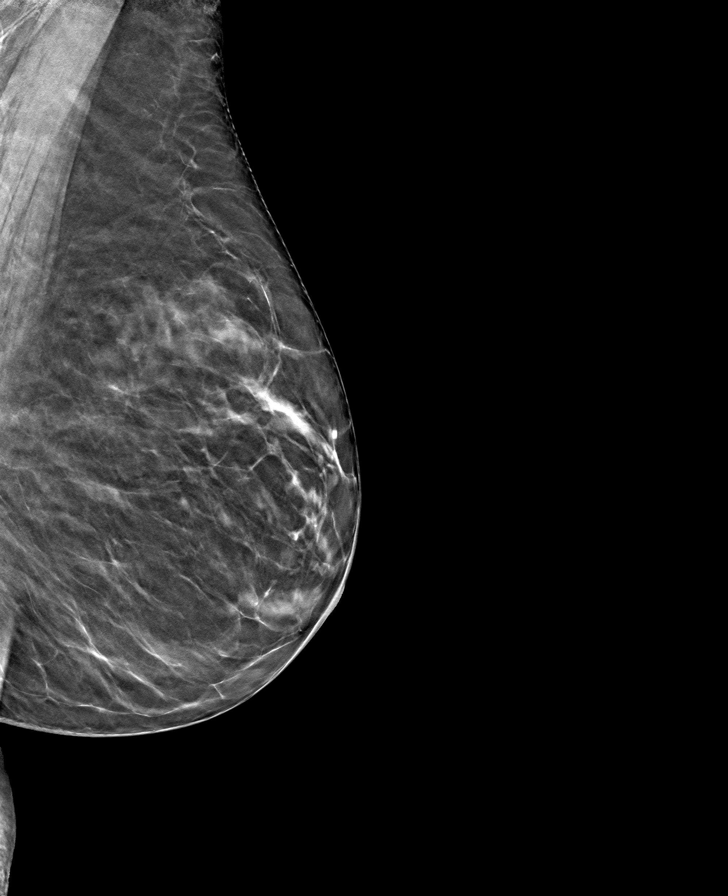

[R MLO tomo · tomo slice 31/62.0]
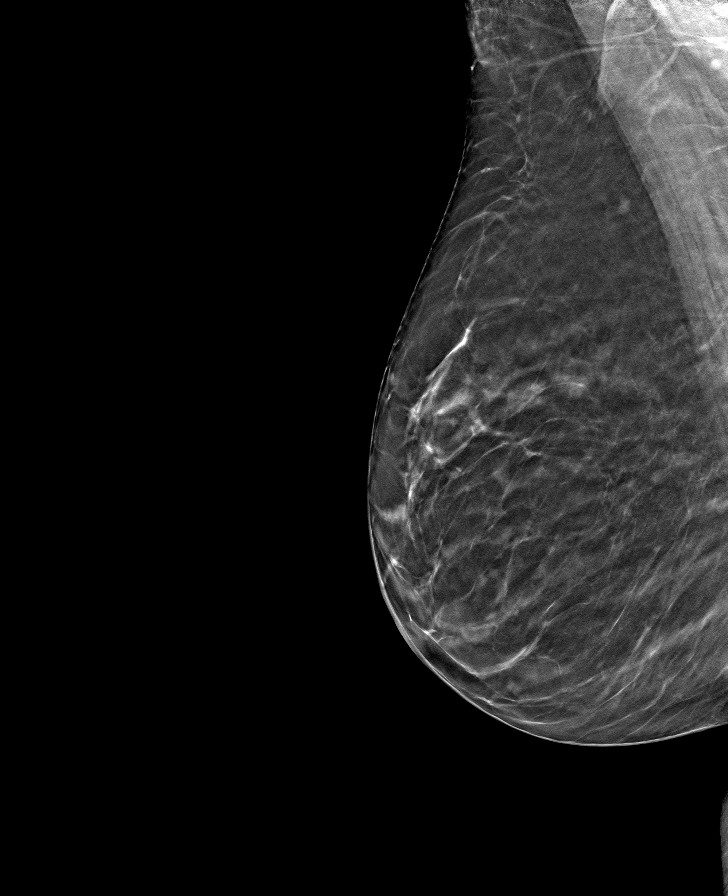

[8 of 24 positions shown; findings below may reference images not displayed]

ACR Breast Density Category b: There are scattered areas of
fibroglandular density.
FINDINGS: There are no findings suspicious for malignancy.
IMPRESSION: No mammographic evidence of malignancy. A result letter of this
screening mammogram will be mailed directly to the patient.

RECOMMENDATION:
Screening mammogram in one year. (Code:51-O-LD2)

BI-RADS CATEGORY  1: Negative.

## 2023-09-06 ENCOUNTER — Telehealth: Payer: Self-pay | Admitting: Family

## 2023-09-06 NOTE — Telephone Encounter (Signed)
Copied from CRM [id number]. Topic: Medicare AWV >> Sep 06, 2023  1:35 PM Payton Doughty wrote: Reason for CRM: Called LVM 09/06/2023 to schedule AWV. Please schedule office or virtual visits.  Verlee Rossetti; Care Guide Ambulatory Clinical Support [place] [facility] Direct Dial: [phone]

## 2023-09-20 ENCOUNTER — Ambulatory Visit: Payer: Medicare HMO

## 2023-09-20 ENCOUNTER — Encounter: Payer: Self-pay | Admitting: Family

## 2023-09-20 DIAGNOSIS — Z13820 Encounter for screening for osteoporosis: Secondary | ICD-10-CM | POA: Diagnosis not present

## 2023-09-20 DIAGNOSIS — Z1231 Encounter for screening mammogram for malignant neoplasm of breast: Secondary | ICD-10-CM

## 2023-09-20 DIAGNOSIS — M85832 Other specified disorders of bone density and structure, left forearm: Secondary | ICD-10-CM | POA: Diagnosis not present

## 2023-09-20 DIAGNOSIS — Z780 Asymptomatic menopausal state: Secondary | ICD-10-CM | POA: Diagnosis not present

## 2023-09-21 ENCOUNTER — Other Ambulatory Visit: Payer: Self-pay | Admitting: Family

## 2023-10-05 ENCOUNTER — Ambulatory Visit: Payer: Medicare HMO

## 2023-10-05 VITALS — Ht 61.5 in | Wt 136.0 lb

## 2023-10-05 DIAGNOSIS — Z0000 Encounter for general adult medical examination without abnormal findings: Secondary | ICD-10-CM | POA: Diagnosis not present

## 2023-10-05 NOTE — Progress Notes (Signed)
 Subjective:   Jocelyn Morgan is a 73 y.o. female who presents for Medicare Annual (Subsequent) preventive examination.  Visit Complete: Virtual I connected with  Gerre Couch on 10/05/23 by a audio enabled telemedicine application and verified that I am speaking with the correct person using two identifiers.  Patient Location: Home  Provider Location: Home Office  I discussed the limitations of evaluation and management by telemedicine. The patient expressed understanding and agreed to proceed.  Vital Signs: Because this visit was a virtual/telehealth visit, some criteria may be missing or patient reported. Any vitals not documented were not able to be obtained and vitals that have been documented are patient reported.  Patient Medicare AWV questionnaire was completed by the patient on 10/01/23; I have confirmed that all information answered by patient is correct and no changes since this date.  Cardiac Risk Factors include: advanced age (>54men, >50 women)     Objective:    Today's Vitals   10/05/23 0812  Weight: 136 lb (61.7 kg)  Height: 5' 1.5" (1.562 m)   Body mass index is 25.28 kg/m.     10/05/2023    8:24 AM 10/19/2022    8:23 AM 10/06/2021    9:13 AM 05/07/2021   10:00 AM 03/05/2021    3:00 PM 03/05/2021    8:55 AM 02/26/2021   10:56 AM  Advanced Directives  Does Patient Have a Medical Advance Directive? Yes Yes Yes Yes Yes Yes Yes  Type of Estate agent of [place];Living will Healthcare Power of [place];Living will Healthcare Power of [place];Living will;Out of facility DNR (pink MOST or yellow form) Healthcare Power of [place];Living will Healthcare Power of [place];Living will Healthcare Power of [place];Living will Healthcare Power of [place];Living will  Does patient want to make changes to medical advance directive? No - Patient declined No - Patient declined  No - Patient declined No - Patient declined    Copy of Healthcare Power  of Attorney in Chart? Yes - validated most recent copy scanned in chart (See row information) Yes - validated most recent copy scanned in chart (See row information) No - copy requested No - copy requested Yes - validated most recent copy scanned in chart (See row information) Yes - validated most recent copy scanned in chart (See row information)     Current Medications (verified) Outpatient Encounter Medications as of 10/05/2023  Medication Sig   meclizine (ANTIVERT) 25 MG tablet Take 1 tablet (25 mg total) by mouth 3 (three) times daily as needed for dizziness.   acetaminophen (TYLENOL) 650 MG CR tablet Take 650 mg by mouth every 8 (eight) hours as needed for pain.   amitriptyline (ELAVIL) 25 MG tablet Take 1 tablet (25 mg total) by mouth at bedtime. (Patient not taking: Reported on 10/05/2023)   Calcium Carb-Cholecalciferol (CALCIUM 600 + D PO) Take 1 tablet by mouth in the morning and at bedtime.   cholecalciferol (VITAMIN D3) 25 MCG (1000 UNIT) tablet Take 1,000 Units by mouth daily.   diphenhydrAMINE (BENADRYL) 25 mg capsule Take 25 mg by mouth every 6 (six) hours as needed.   Misc Natural Products (JOINT HEALTH PO) Take 1 tablet by mouth daily.   Multiple Vitamin (MULTIVITAMIN WITH MINERALS) TABS tablet Take 1 tablet by mouth daily.   pravastatin (PRAVACHOL) 20 MG tablet Take 1 tablet (20 mg total) by mouth daily.   No facility-administered encounter medications on file as of 10/05/2023.    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   Procedure: RIGHT TOTAL HIP ARTHROPLASTY ANTERIOR APPROACH;  Surgeon: Kathryne Hitch, MD;  Location: [facility];  Service: Orthopedics;  Laterality: Right;   TUBAL LIGATION     Family History  Problem Relation Age of Onset   Cancer Mother 39       breast   Allergic rhinitis Mother    Urticaria Mother    COPD Father        lived to be 98   Bladder Cancer Father    Ulcerative colitis Brother    CAD Brother    Prostate cancer Brother    Cancer Cousin 53       Stage 3 breast cancer   Arthritis Other    Diabetes Other    Cancer Other        bladder   Vitiligo Son    Social History   Socioeconomic History   Marital status: Married    Spouse name: Not on file   Number of children: 4   Years of education: Not on file   Highest education level: Associate degree: occupational, Scientist, product/process development, or vocational program  Occupational History   Occupation: housewife  Tobacco Use   Smoking status: Never   Smokeless tobacco: Never  Vaping Use   Vaping status: Never Used  Substance and Sexual Activity   Alcohol use: Yes    Alcohol/week: 1.0 - 2.0 standard drink of alcohol    Types: 1 - 2 Glasses of wine per week    Comment: occasional   Drug use: No   Sexual activity: Yes  Other Topics Concern   Not on file  Social History Narrative   Exercise: sometimes   Caffeine: 2 cups 1/2 and 1/2 coffee daily   6 grandchildren (none of her children are in [place])   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          Social Drivers of Health   Financial Resource Strain: Low Risk  (10/05/2023)   Overall Financial Resource Strain (CARDIA)    Difficulty of Paying Living Expenses: Not hard at all  Food Insecurity: No Food Insecurity (10/05/2023)   Hunger Vital Sign    Worried About Running Out of Food in the Last Year: Never true    Ran Out of Food in the Last Year: Never true  Transportation Needs: No Transportation Needs (10/05/2023)   PRAPARE -  Administrator, Civil Service (Medical): No    Lack of Transportation (Non-Medical): No  Physical Activity: Insufficiently Active (10/05/2023)   Exercise Vital Sign    Days of Exercise per Week: 3 days    Minutes of Exercise per Session: 30 min  Stress: No Stress Concern Present (10/05/2023)   [employer] of Occupational Health - Occupational Stress Questionnaire    Feeling of Stress : Not at all  Social Connections: Socially Integrated (10/05/2023)   Social Connection and Isolation Panel [NHANES]    Frequency of Communication with Friends and Family: More than three times  a week    Frequency of Social Gatherings with Friends and Family: More than three times a week    Attends Religious Services: More than 4 times per year    Active Member of [employer] or Organizations: Yes    Attends Engineer, structural: More than 4 times per year    Marital Status: Married    Tobacco Counseling Counseling given: Not Answered   Clinical Intake:  Pre-visit preparation completed: Yes  Pain : No/denies pain     BMI - recorded: 25.28 Nutritional Status: BMI 25 -29 Overweight Nutritional Risks: None Diabetes: No  How often do you need to have someone help you when you read instructions, pamphlets, or other written materials from your doctor or pharmacy?: 1 - Never  Interpreter Needed?: No  Information entered by :: Theresa Mulligan LPN   Activities of Daily Living    10/05/2023    8:20 AM 10/01/2023   10:39 AM  In your present state of health, do you have any difficulty performing the following activities:  Hearing? 0 0  Vision? 0 0  Difficulty concentrating or making decisions? 0 0  Walking or climbing stairs? 0 0  Dressing or bathing? 0 0  Doing errands, shopping? 0 0  Preparing Food and eating ? N N  Using the Toilet? N N  In the past six months, have you accidently leaked urine? N Y  Do you have problems with loss of bowel control? N N  Managing your  Medications? N N  Managing your Finances? N N  Housekeeping or managing your Housekeeping? N N    Patient Care Team: Sandford Craze, NP as PCP - General Pearletha Forge Azucena Fallen, MD as Consulting Physician (Sports Medicine)  Indicate any recent Medical Services you may have received from other than [facility] providers in the past year (date may be approximate).     Assessment:   This is a routine wellness examination for [facility].  Hearing/Vision screen Hearing Screening - Comments:: Wears Hearing Aids Vision Screening - Comments:: Wears rx glasses - up to date with routine eye exams with  Dr Shawna Orleans   Goals Addressed               This Visit's Progress     Remain Active (pt-stated)        Stay healthy.       Depression Screen    10/05/2023    8:19 AM 01/18/2023    8:30 AM 10/19/2022    8:26 AM 10/06/2021    9:07 AM 04/09/2021   10:31 AM 12/14/2020   12:24 PM 07/08/2020    8:15 AM  PHQ 2/9 Scores  PHQ - 2 Score 0 0 0 0 0 0 0  PHQ- 9 Score  0         Fall Risk    10/05/2023    8:22 AM 10/01/2023   10:39 AM 01/18/2023    8:30 AM 10/18/2022    2:30 PM 10/05/2022    4:09 PM  Fall Risk   Falls in the past year? 1 1 0 1 1  Number falls in past yr: 0 1 0 0 0  Injury with Fall? 1 1 0 0 0  Comment Bruising to face and nose  No medical attention.      Risk for fall due to : No Fall Risks  No Fall Risks History of fall(s)   Follow up Falls prevention discussed;Falls evaluation completed  Falls evaluation completed Falls evaluation completed  MEDICARE RISK AT HOME: Medicare Risk at Home Any stairs in or around the home?: Yes If so, are there any without handrails?: No Home free of loose throw rugs in walkways, pet beds, electrical cords, etc?: Yes Adequate lighting in your home to reduce risk of falls?: Yes Life alert?: No Use of a cane, walker or w/c?: No Grab bars in the bathroom?: No Shower chair or bench in shower?: No Elevated toilet seat or a handicapped  toilet?: Yes  TIMED UP AND GO:  Was the test performed?  No    Cognitive Function:        10/05/2023    8:29 AM 10/19/2022    8:30 AM 10/06/2021    9:14 AM  6CIT Screen  What Year? 0 points 0 points 0 points  What month? 0 points 0 points 0 points  What time? 0 points 0 points 0 points  Count back from 20 0 points 0 points 0 points  Months in reverse 0 points 0 points 0 points  Repeat phrase 0 points 0 points 0 points  Total Score 0 points 0 points 0 points    Immunizations Immunization History  Administered Date(s) Administered   Covid-19 Iv Non-us Vaccine (Bibp, Sinopharm) 04/06/2023   Fluad Quad(high Dose 65+) 04/25/2019, 05/05/2020, 05/24/2021, 04/06/2023   Influenza Whole 05/05/2009   Influenza, High Dose Seasonal PF 04/28/2015, 05/04/2016, 05/10/2017, 05/16/2018   Influenza-Unspecified 04/24/2014, 05/24/2022   PFIZER(Purple Top)SARS-COV-2 Vaccination 08/18/2019, 09/09/2019, 06/05/2020, 06/05/2021   Pfizer(Comirnaty)Fall Seasonal Vaccine 12 years and older 06/03/2022   Pneumococcal Conjugate-13 04/28/2015   Pneumococcal Polysaccharide-23 05/04/2016   Respiratory Syncytial Virus Vaccine,Recomb Aduvanted(Arexvy) 06/23/2022   Td 03/15/2005, 04/28/2015   Unspecified SARS-COV-2 Vaccination 06/03/2022   Zoster Recombinant(Shingrix) 03/04/2018, 05/04/2018   Zoster, Live 08/31/2010    TDAP status: Up to date  Flu Vaccine status: Up to date  Pneumococcal vaccine status: Up to date  Covid-19 vaccine status: Declined, Education has been provided regarding the importance of this vaccine but patient still declined. Advised may receive this vaccine at local pharmacy or Health Dept.or vaccine clinic. Aware to provide a copy of the vaccination record if obtained from local pharmacy or Health Dept. Verbalized acceptance and understanding.  Qualifies for Shingles Vaccine? Yes   Zostavax completed Yes   Shingrix Completed?: Yes  Screening Tests Health Maintenance  Topic Date  Due   COVID-19 Vaccine (7 - 2024-25 season) 06/01/2023   Medicare Annual Wellness (AWV)  10/04/2024   DTaP/Tdap/Td (3 - Tdap) 04/27/2025   MAMMOGRAM  09/19/2025   Colonoscopy  12/01/2026   Pneumonia Vaccine 40+ Years old  Completed   INFLUENZA VACCINE  Completed   DEXA SCAN  Completed   Hepatitis C Screening  Completed   Zoster Vaccines- Shingrix  Completed   HPV VACCINES  Aged Out    Health Maintenance  Health Maintenance Due  Topic Date Due   COVID-19 Vaccine (7 - 2024-25 season) 06/01/2023    Colorectal cancer screening: Type of screening: Colonoscopy. Completed 11/30/21. Repeat every 5 years  Mammogram status: Completed 09/20/23. Repeat every year  Bone Density status: Completed 09/20/23. Results reflect: Bone density results: OSTEOPENIA. Repeat every   years.   Additional Screening:  Hepatitis C Screening: does qualify; Completed 05/04/16  Vision Screening: Recommended annual ophthalmology exams for early detection of glaucoma and other disorders of the eye. Is the patient up to date with their annual eye exam?  Yes  Who is the provider or what is the name of the office in which  the patient attends annual eye exams? Dr Shawna Orleans If pt is not established with a provider, would they like to be referred to a provider to establish care? No .   Dental Screening: Recommended annual dental exams for proper oral hygiene    Community Resource Referral / Chronic Care Management:  CRR required this visit?  No   CCM required this visit?  No     Plan:     I have personally reviewed and noted the following in the patient's chart:   Medical and social history Use of alcohol, tobacco or illicit drugs  Current medications and supplements including opioid prescriptions. Patient is not currently taking opioid prescriptions. Functional ability and status Nutritional status Physical activity Advanced directives List of other physicians Hospitalizations, surgeries, and ER  visits in previous 12 months Vitals Screenings to include cognitive, depression, and falls Referrals and appointments  In addition, I have reviewed and discussed with patient certain preventive protocols, quality metrics, and best practice recommendations. A written personalized care plan for preventive services as well as general preventive health recommendations were provided to patient.     Tillie Rung, LPN   1/61/0960   After Visit Summary: (MyChart) Due to this being a telephonic visit, the after visit summary with patients personalized plan was offered to patient via MyChart   Nurse Notes: None

## 2023-10-05 NOTE — Patient Instructions (Addendum)
 Jocelyn Morgan , Thank you for taking time to come for your Medicare Wellness Visit. I appreciate your ongoing commitment to your health goals. Please review the following plan we discussed and let me know if I can assist you in the future.   Referrals/Orders/Follow-Ups/Clinician Recommendations: Remain Active  This is a list of the screening recommended for you and due dates:  Health Maintenance  Topic Date Due   COVID-19 Vaccine (7 - 2024-25 season) 06/01/2023   Medicare Annual Wellness Visit  10/04/2024   DTaP/Tdap/Td vaccine (3 - Tdap) 04/27/2025   Mammogram  09/19/2025   Colon Cancer Screening  12/01/2026   Pneumonia Vaccine  Completed   Flu Shot  Completed   DEXA scan (bone density measurement)  Completed   Hepatitis C Screening  Completed   Zoster (Shingles) Vaccine  Completed   HPV Vaccine  Aged Out    Advanced directives: (In Chart) A copy of your advanced directives are scanned into your chart should your provider ever need it.  Next Medicare Annual Wellness Visit scheduled for next year: Yes

## 2023-10-10 ENCOUNTER — Other Ambulatory Visit: Payer: Self-pay | Admitting: Family

## 2023-10-10 DIAGNOSIS — E7800 Pure hypercholesterolemia, unspecified: Secondary | ICD-10-CM

## 2023-10-24 DIAGNOSIS — H43813 Vitreous degeneration, bilateral: Secondary | ICD-10-CM | POA: Diagnosis not present

## 2023-10-24 DIAGNOSIS — G43B0 Ophthalmoplegic migraine, not intractable: Secondary | ICD-10-CM | POA: Diagnosis not present

## 2024-01-16 ENCOUNTER — Ambulatory Visit (INDEPENDENT_AMBULATORY_CARE_PROVIDER_SITE_OTHER): Admitting: Family

## 2024-01-16 VITALS — BP 127/67 | HR 87 | Temp 99.1°F | Resp 16 | Ht 61.5 in | Wt 137.0 lb

## 2024-01-16 DIAGNOSIS — G4700 Insomnia, unspecified: Secondary | ICD-10-CM | POA: Diagnosis not present

## 2024-01-16 DIAGNOSIS — R197 Diarrhea, unspecified: Secondary | ICD-10-CM

## 2024-01-16 DIAGNOSIS — G43109 Migraine with aura, not intractable, without status migrainosus: Secondary | ICD-10-CM | POA: Diagnosis not present

## 2024-01-16 NOTE — Patient Instructions (Signed)
 VISIT SUMMARY:  During your visit, we discussed your ongoing abdominal issues and ocular migraines. We also reviewed your lactose intolerance and sleep disturbances due to allergies. We have outlined a plan to manage these issues and improve your overall health.  YOUR PLAN:  -CHRONIC DIARRHEA: You've been experiencing diarrhea with mucus and cramping for three weeks. We're considering several possible causes, including an infection, irritable bowel syndrome, or inflammation in your colon. We're referring you to a gastroenterologist for further evaluation and have ordered stool studies. You can use Imodium for relief from your symptoms. If your symptoms persist, we may need to update your colonoscopy.  -OCULAR MIGRAINE: You've been having episodes of colorful zigzag patterns and blurry vision, lasting about 20 minutes, occurring three times in the last two months. 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 We'll monitor the frequency and severity of these migraines. If they worsen, we may consider daily migraine prevention medication.  -SLEEP DISTURBANCE: You're currently using cetirizine for allergy  relief, which also helps you sleep without causing grogginess. This medication is safe for indefinite use for this purpose, so you should continue using it.  -GENERAL HEALTH MAINTENANCE: We discussed the importance of regular colonoscopy screenings, especially given your family history of precancerous polyps. Your last colonoscopy was two years ago. We also talked about using lactate products to manage your lactose intolerance.  INSTRUCTIONS:  We have scheduled a follow-up appointment in two weeks for a six-month check-up. We will send a referral to a gastroenterologist, Dr. Ronal Lobo, and you should schedule an appointment with them in advance due to potential wait times. Your stool cultures may take up to a week for results. Please cancel your upcoming appointment and reschedule it for  after January 8th for your annual follow-up. Make sure to follow up with the gastroenterologist if your appointment is not scheduled.

## 2024-01-16 NOTE — Assessment & Plan Note (Signed)
-   did not tolerate elavil .  Current use of cetirizine for allergy  relief also aids sleep without grogginess. Cetirizine is considered safe for indefinite use for this purpose. - Continue cetirizine for sleep and allergy  relief.

## 2024-01-16 NOTE — Progress Notes (Signed)
 Subjective:     Patient ID: Jocelyn Morgan, female    DOB: Jun 24, 1951, 73 y.o.   MRN: [id number]  Chief Complaint  Patient presents with   Abdominal Pain    Patient reports abdominal pain and diarrhea on and off x 3 weeks.    Migraine    Patient ocular migraine episodes. Has happened 3 times in the past 2 months    HPI  Discussed the use of AI scribe software for clinical note transcription with the patient, who gave verbal consent to proceed.  History of Present Illness  Jocelyn Morgan is a 73 year old female who presents with abdominal issues and ocular migraines.  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 ago - Symptoms include zigzag patterns across the eyes, colorful and blurry vision - Episodes last about 20 minutes - No associated headache - Diagnosis confirmed by eye doctor - Two additional episodes since initial event, most recent episode occurred yesterday  Lactose intolerance - Manages symptoms by avoiding ice cream and fatty foods  Allergic rhinitis and sleep disturbance - Uses cetirizine for allergies, which also aids sleep - Amitriptyline  previously used but discontinued due to grogginess     Health Maintenance Due  Topic Date Due   COVID-19 Vaccine (7 - 2024-25 season) 06/01/2023    Past Medical History:  Diagnosis Date   Allergy  03/06/2021   see chart   Arthritis    osteoarthritis   GERD (gastroesophageal reflux disease) Fall of 2023   Hyperlipidemia    Liver hemangioma 03/16/2011   Postmenopausal HRT (hormone replacement therapy)     Past  Surgical History:  Procedure Laterality Date   EYE SURGERY Bilateral 02/2022   EYE SURGERY  1954   strabismus   JOINT REPLACEMENT  03/05/2021   right hip   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   Procedure: RIGHT TOTAL HIP ARTHROPLASTY ANTERIOR APPROACH;  Surgeon: Vernetta Lonni GRADE, MD;  Location: [facility];  Service: Orthopedics;  Laterality: Right;   TUBAL LIGATION      Family History  Problem Relation Age of Onset   Cancer Mother 51       breast   Allergic rhinitis Mother    Urticaria Mother    COPD Father        lived to be 3   Bladder Cancer Father    Ulcerative colitis Brother    CAD Brother    Prostate cancer Brother    Cancer Cousin 50       Stage 3 breast cancer   Arthritis Other    Diabetes Other    Cancer Other        bladder   Vitiligo Son     Social History   Socioeconomic History   Marital status: Married    Spouse name: Not on file   Number of children: 4   Years of education: Not on file   Highest education level: Associate degree: academic program  Occupational History   Occupation: housewife  Tobacco Use   Smoking status: Never   Smokeless tobacco: Never  Vaping Use   Vaping status: Never Used  Substance and  Sexual Activity   Alcohol use: Yes    Alcohol/week: 1.0 - 2.0 standard drink of alcohol    Types: 1 - 2 Glasses of wine per week    Comment: occasional   Drug use: No   Sexual activity: Yes  Other Topics Concern   Not on file  Social History Narrative   Exercise: sometimes   Caffeine: 2 cups 1/2 and 1/2 coffee daily   6 grandchildren (none of her children are in [place])   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          Social Drivers of Health   Financial Resource Strain: Low Risk  (01/14/2024)   Overall Financial Resource Strain (CARDIA)    Difficulty of Paying Living Expenses: Not hard at all  Food Insecurity: No Food Insecurity (01/14/2024)   Hunger Vital Sign    Worried About Running Out of Food in the Last Year: Never true     Ran Out of Food in the Last Year: Never true  Transportation Needs: No Transportation Needs (01/14/2024)   PRAPARE - Administrator, Civil Service (Medical): No    Lack of Transportation (Non-Medical): No  Physical Activity: Insufficiently Active (01/14/2024)   Exercise Vital Sign    Days of Exercise per Week: 3 days    Minutes of Exercise per Session: 30 min  Stress: No Stress Concern Present (01/14/2024)   [employer] of Occupational Health - Occupational Stress Questionnaire    Feeling of Stress: Only a little  Social Connections: Socially Integrated (01/14/2024)   Social Connection and Isolation Panel    Frequency of Communication with Friends and Family: More than three times a week    Frequency of Social Gatherings with Friends and Family: Once a week    Attends Religious Services: More than 4 times per year    Active Member of [employer] or Organizations: Yes    Attends Banker Meetings: 1 to 4 times per year    Marital Status: Married  Catering manager Violence: Not At Risk (10/05/2023)   Humiliation, Afraid, Rape, and Kick questionnaire    Fear of Current or Ex-Partner: No    Emotionally Abused: No    Physically Abused: No    Sexually Abused: No    Outpatient Medications Prior to Visit  Medication Sig Dispense Refill   acetaminophen  (TYLENOL ) 650 MG CR tablet Take 650 mg by mouth every 8 (eight) hours as needed for pain.     amitriptyline  (ELAVIL ) 25 MG tablet Take 1 tablet (25 mg total) by mouth at bedtime. 30 tablet 2   Calcium  Carb-Cholecalciferol  (CALCIUM  600 + D PO) Take 1 tablet by mouth in the morning and at bedtime.     cholecalciferol  (VITAMIN D3) 25 MCG (1000 UNIT) tablet Take 1,000 Units by mouth daily.     diphenhydrAMINE  (BENADRYL ) 25 mg capsule Take 25 mg by mouth every 6 (six) hours as needed.     meclizine  (ANTIVERT ) 25 MG tablet Take 1 tablet (25 mg total) by mouth 3 (three) times daily as needed for dizziness. 30 tablet 0   Misc  Natural Products (JOINT HEALTH PO) Take 1 tablet by mouth daily.     Multiple Vitamin (MULTIVITAMIN WITH MINERALS) TABS tablet Take 1 tablet by mouth daily.     pravastatin  (PRAVACHOL ) 20 MG tablet TAKE 1 TABLET BY MOUTH DAILY 90 tablet 1   No facility-administered medications prior to visit.    Allergies  Allergen Reactions   Demerol  [Meperidine  Hcl] Nausea And  Vomiting    In combination with versed caused severe n/v and headaches    Versed [Midazolam] Nausea And Vomiting    In combination with demerol  caused severe n/v and headaches     ROS See HPI    Objective:    Physical Exam Constitutional:      General: She is not in acute distress.    Appearance: Normal appearance. She is well-developed.  HENT:     Head: Normocephalic and atraumatic.     Right Ear: External ear normal.     Left Ear: External ear normal.   Eyes:     General: No scleral icterus.  Neck:     Thyroid : No thyromegaly.   Cardiovascular:     Rate and Rhythm: Normal rate and regular rhythm.     Heart sounds: Normal heart sounds. No murmur heard. Pulmonary:     Effort: Pulmonary effort is normal. No respiratory distress.     Breath sounds: Normal breath sounds. No wheezing.   Musculoskeletal:     Cervical back: Neck supple.   Skin:    General: Skin is warm and dry.   Neurological:     Mental Status: She is alert and oriented to person, place, and time.   Psychiatric:        Mood and Affect: Mood normal.        Behavior: Behavior normal.        Thought Content: Thought content normal.        Judgment: Judgment normal.      BP 127/67 (BP Location: Right Arm, Patient Position: Sitting, Cuff Size: Small)   Pulse 87   Temp 99.1 F (37.3 C) (Oral)   Resp 16   Ht 5' 1.5 (1.562 m)   Wt 137 lb (62.1 kg)   LMP 07/25/1998   SpO2 98%   BMI 25.47 kg/m  Wt Readings from Last 3 Encounters:  01/16/24 137 lb (62.1 kg)  10/05/23 136 lb (61.7 kg)  08/02/23 136 lb (61.7 kg)       Assessment &  Plan:   Problem List Items Addressed This Visit       Unprioritized   Ocular migraine    Episodes of colorful zigzag patterns and blurry vision lasting about 20 minutes, occurring three times in the last two months. No associated headache. Normal eye examination by ophthalmologist. Consider daily migraine prevention medication if frequency or severity worsens. - Monitor frequency and severity of ocular migraines. - Consider daily migraine prevention medication if frequency or severity worsens.      Insomnia   - did not tolerate elavil .  Current use of cetirizine for allergy  relief also aids sleep without grogginess. Cetirizine is considered safe for indefinite use for this purpose. - Continue cetirizine for sleep and allergy  relief.      Diarrhea - Primary    Intermittent diarrhea with mucus and cramping for three weeks. Differential includes infectious etiology, irritable bowel syndrome, or colonic inflammation. Potential need for updated colonoscopy if symptoms persist. Referral to gastroenterologist Dr. Ronal Lobo for continuity of care. Imodium can be used for symptomatic relief. - Order stool studies. - Refer to gastroenterology for further evaluation. - Advise use of Imodium for symptomatic relief.       Relevant Orders   Ambulatory referral to Gastroenterology   Clostridium Difficile by PCR([facility])   Stool Culture    I am having Jocelyn HERO. Evangelist maintain her Misc Natural Products (JOINT HEALTH PO), acetaminophen , multivitamin with minerals, Calcium  Carb-Cholecalciferol  (CALCIUM  600 +  D PO), cholecalciferol , meclizine , diphenhydrAMINE , amitriptyline , and pravastatin .  No orders of the defined types were placed in this encounter.

## 2024-01-16 NOTE — Assessment & Plan Note (Signed)
  Episodes of colorful zigzag patterns and blurry vision lasting about 20 minutes, occurring three times in the last two months. No associated headache. Normal eye examination by ophthalmologist. Consider daily migraine prevention medication if frequency or severity worsens. - Monitor frequency and severity of ocular migraines. - Consider daily migraine prevention medication if frequency or severity worsens.

## 2024-01-16 NOTE — Assessment & Plan Note (Signed)
  Intermittent diarrhea with mucus and cramping for three weeks. Differential includes infectious etiology, irritable bowel syndrome, or colonic inflammation. Potential need for updated colonoscopy if symptoms persist. Referral to gastroenterologist Dr. Ronal Lobo for continuity of care. Imodium can be used for symptomatic relief. - Order stool studies. - Refer to gastroenterology for further evaluation. - Advise use of Imodium for symptomatic relief.

## 2024-01-17 ENCOUNTER — Other Ambulatory Visit

## 2024-01-17 DIAGNOSIS — R197 Diarrhea, unspecified: Secondary | ICD-10-CM

## 2024-01-21 LAB — STOOL CULTURE: E coli, Shiga toxin Assay: NEGATIVE

## 2024-01-22 ENCOUNTER — Ambulatory Visit: Payer: Self-pay | Admitting: Family

## 2024-01-22 LAB — CLOSTRIDIUM DIFFICILE BY PCR: Toxigenic C. Difficile by PCR: NEGATIVE

## 2024-01-31 ENCOUNTER — Ambulatory Visit: Payer: Medicare HMO | Admitting: Family

## 2024-02-01 ENCOUNTER — Encounter: Payer: Self-pay | Admitting: Orthopaedic Surgery

## 2024-02-01 ENCOUNTER — Ambulatory Visit: Admitting: Orthopaedic Surgery

## 2024-02-01 ENCOUNTER — Other Ambulatory Visit (INDEPENDENT_AMBULATORY_CARE_PROVIDER_SITE_OTHER): Payer: Self-pay

## 2024-02-01 DIAGNOSIS — Z96641 Presence of right artificial hip joint: Secondary | ICD-10-CM | POA: Diagnosis not present

## 2024-02-01 NOTE — Progress Notes (Signed)
 The patient is a 73 year old female well-known to me.  We replaced her right hip almost 3 years ago.  Has done very well.  A few months ago she fell when she was on a exercise walk and really landed more on her right knee.  Since then though she has had pain around her right hip area.  She points to her low back but also the lateral aspect of her right hip as a source of her pain.  She says that it does sometimes wake her up at night as well.  On exam her right hip moves smoothly and fluidly and is well located.  Her right knee exam is normal.  Her right hip does hurt over the trochanteric area to palpation.  An AP pelvis and lateral right hip shows a well-seated right total hip arthroplasty and I see no evidence of fracture in this area.  I have shown her some stretching exercises I want her to try as well as a combination of 1 Aleve twice a day and Voltaren gel to this area twice a day.  My neck step would be having her consider a steroid injection over this area if it is not getting better.  All question concerns were answered and addressed.  She knows to reach out to us  if it is not improving.

## 2024-02-05 ENCOUNTER — Other Ambulatory Visit: Payer: Self-pay | Admitting: Family

## 2024-02-05 DIAGNOSIS — E7800 Pure hypercholesterolemia, unspecified: Secondary | ICD-10-CM

## 2024-04-17 DIAGNOSIS — K580 Irritable bowel syndrome with diarrhea: Secondary | ICD-10-CM | POA: Diagnosis not present

## 2024-05-08 DIAGNOSIS — H52222 Regular astigmatism, left eye: Secondary | ICD-10-CM | POA: Diagnosis not present

## 2024-05-08 DIAGNOSIS — H524 Presbyopia: Secondary | ICD-10-CM | POA: Diagnosis not present

## 2024-05-27 ENCOUNTER — Encounter: Payer: Self-pay | Admitting: Radiology

## 2024-06-24 ENCOUNTER — Ambulatory Visit: Admitting: Family

## 2024-06-24 VITALS — BP 128/61 | HR 108 | Temp 98.3°F | Resp 16 | Ht 61.5 in | Wt 134.0 lb

## 2024-06-24 DIAGNOSIS — J020 Streptococcal pharyngitis: Secondary | ICD-10-CM | POA: Insufficient documentation

## 2024-06-24 DIAGNOSIS — J029 Acute pharyngitis, unspecified: Secondary | ICD-10-CM

## 2024-06-24 LAB — POCT RAPID STREP A (OFFICE): Rapid Strep A Screen: POSITIVE — AB

## 2024-06-24 MED ORDER — AMOXICILLIN 500 MG PO CAPS: 500.0000 mg | ORAL_CAPSULE | Freq: Two times a day (BID) | ORAL | 0 refills | Status: AC

## 2024-06-24 NOTE — Assessment & Plan Note (Signed)
 Rapid strep is +. Will rx with amoxicillin  x 10 days. Pt is advised to call if symptoms worsen or if symptoms fail to improve.

## 2024-06-24 NOTE — Progress Notes (Signed)
 Subjective:     Patient ID: Jocelyn Morgan, female    DOB: 06-06-1951, 73 y.o.   MRN: [id number]  Chief Complaint  Patient presents with   Ear Fullness    Patient reports bilateral ear pressure, some pain for about 10 days   Generalized Body Aches    Patient complains of body aches and chills for about 10 days, mostly at night    Ear Fullness     Discussed the use of AI scribe software for clinical note transcription with the patient, who gave verbal consent to proceed.  History of Present Illness Jocelyn Morgan is a 73 year old female who presents with ear fullness, neck pain, and throat irritation.  She has been experiencing symptoms for approximately a week to ten days, including a sensation of fullness in her ears, pain in her neck, and irritation in her throat, particularly in the lower part. The ear fullness is described as feeling like 'sticking a pen, like a pencil in my ear to relieve the pressure.'  She experiences end-of-day aches and chills, and noted a low-grade fever of 99.82F last night. Aleve has been taken daily to manage the achiness, which helps alleviate her symptoms. However, she did not take Aleve yesterday or today to monitor her symptoms.  She mentions an 'off taste' and has been experiencing headaches and tenderness in her neck area. She recalls that these symptoms began before her grandchildren visited, one of whom had strep throat.  No significant fever.      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   Procedure: RIGHT TOTAL HIP ARTHROPLASTY ANTERIOR APPROACH;  Surgeon: Vernetta Lonni GRADE, MD;  Location: [facility];  Service: Orthopedics;  Laterality: Right;   TUBAL LIGATION      Family History  Problem Relation Age of Onset   Cancer Mother 65       breast   Allergic rhinitis Mother    Urticaria Mother    COPD Father        lived to be 69   Bladder Cancer Father    Ulcerative colitis Brother    CAD Brother    Prostate cancer Brother    Cancer Cousin 48       Stage 3 breast cancer   Arthritis Other    Diabetes Other    Cancer Other        bladder   Vitiligo Son     Social History   Socioeconomic History   Marital status: Married    Spouse name: Not on file   Number of children: 4   Years of education: Not on file   Highest education level: Associate degree: occupational, scientist, product/process development, or vocational program  Occupational History   Occupation: housewife  Tobacco Use   Smoking status: Never   Smokeless tobacco: Never  Vaping Use   Vaping status: Never  Used  Substance and Sexual Activity   Alcohol use: Yes    Alcohol/week: 1.0 - 2.0 standard drink of alcohol    Types: 1 - 2 Glasses of wine per week    Comment: occasional   Drug use: No   Sexual activity: Yes  Other Topics Concern   Not on file  Social History Narrative   Exercise: sometimes   Caffeine: 2 cups 1/2 and 1/2 coffee daily   6 grandchildren (none of her children are in [place])   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          Social Drivers of Health   Financial Resource Strain: Low Risk  (06/22/2024)   Overall Financial Resource Strain (CARDIA)    Difficulty of Paying Living Expenses: Not hard at all  Food Insecurity: No Food Insecurity (06/22/2024)   Hunger Vital Sign    Worried About Running Out of Food in the Last Year: Never true    Ran Out of Food in the Last Year: Never true   Transportation Needs: No Transportation Needs (06/22/2024)   PRAPARE - Administrator, Civil Service (Medical): No    Lack of Transportation (Non-Medical): No  Physical Activity: Insufficiently Active (06/22/2024)   Exercise Vital Sign    Days of Exercise per Week: 4 days    Minutes of Exercise per Session: 30 min  Stress: No Stress Concern Present (06/22/2024)   [employer] of Occupational Health - Occupational Stress Questionnaire    Feeling of Stress: Only a little  Social Connections: Socially Integrated (06/22/2024)   Social Connection and Isolation Panel    Frequency of Communication with Friends and Family: More than three times a week    Frequency of Social Gatherings with Friends and Family: Once a week    Attends Religious Services: More than 4 times per year    Active Member of [employer] or Organizations: Yes    Attends Banker Meetings: 1 to 4 times per year    Marital Status: Married  Catering Manager Violence: Not At Risk (10/05/2023)   Humiliation, Afraid, Rape, and Kick questionnaire    Fear of Current or Ex-Partner: No    Emotionally Abused: No    Physically Abused: No    Sexually Abused: No    Outpatient Medications Prior to Visit  Medication Sig Dispense Refill   acetaminophen  (TYLENOL ) 650 MG CR tablet Take 650 mg by mouth every 8 (eight) hours as needed for pain.     Calcium  Carb-Cholecalciferol  (CALCIUM  600 + D PO) Take 1 tablet by mouth in the morning and at bedtime.     cholecalciferol  (VITAMIN D3) 25 MCG (1000 UNIT) tablet Take 1,000 Units by mouth daily.     meclizine  (ANTIVERT ) 25 MG tablet Take 1 tablet (25 mg total) by mouth 3 (three) times daily as needed for dizziness. 30 tablet 0   Misc Natural Products (JOINT HEALTH PO) Take 1 tablet by mouth daily.     Multiple Vitamin (MULTIVITAMIN WITH MINERALS) TABS tablet Take 1 tablet by mouth daily.     pravastatin  (PRAVACHOL ) 20 MG tablet Take 1 tablet (20 mg total) by mouth daily.  90 tablet 1   amitriptyline  (ELAVIL ) 25 MG tablet Take 1 tablet (25 mg total) by mouth at bedtime. 30 tablet 2   diphenhydrAMINE  (BENADRYL ) 25 mg capsule Take 25 mg by mouth every 6 (six) hours as needed.     No facility-administered medications prior to visit.    Allergies  Allergen Reactions  Demerol  [Meperidine  Hcl] Nausea And Vomiting    In combination with versed caused severe n/v and headaches    Versed [Midazolam] Nausea And Vomiting    In combination with demerol  caused severe n/v and headaches     ROS See HPI    Objective:    Physical Exam Constitutional:      General: She is not in acute distress.    Appearance: Normal appearance. She is well-developed.  HENT:     Head: Normocephalic and atraumatic.     Right Ear: External ear normal. Tympanic membrane is retracted. Tympanic membrane is not erythematous.     Left Ear: External ear normal. Tympanic membrane is not erythematous or retracted.     Mouth/Throat:     Pharynx: Posterior oropharyngeal erythema present. No pharyngeal swelling or oropharyngeal exudate.  Eyes:     General: No scleral icterus. Neck:     Thyroid : No thyromegaly.  Cardiovascular:     Rate and Rhythm: Normal rate and regular rhythm.     Heart sounds: Normal heart sounds. No murmur heard. Pulmonary:     Effort: Pulmonary effort is normal. No respiratory distress.     Breath sounds: Normal breath sounds. No wheezing.  Musculoskeletal:     Cervical back: Neck supple.  Lymphadenopathy:     Cervical: Cervical adenopathy present.  Skin:    General: Skin is warm and dry.  Neurological:     Mental Status: She is alert and oriented to person, place, and time.  Psychiatric:        Mood and Affect: Mood normal.        Behavior: Behavior normal.        Thought Content: Thought content normal.        Judgment: Judgment normal.      BP 128/61 (BP Location: Right Arm, Patient Position: Sitting, Cuff Size: Small)   Pulse (!) 108   Temp 98.3 F  (36.8 C) (Oral)   Resp 16   Ht 5' 1.5 (1.562 m)   Wt 134 lb (60.8 kg)   LMP 07/25/1998   SpO2 99%   BMI 24.91 kg/m  Wt Readings from Last 3 Encounters:  06/24/24 134 lb (60.8 kg)  01/16/24 137 lb (62.1 kg)  10/05/23 136 lb (61.7 kg)       Assessment & Plan:   Problem List Items Addressed This Visit       Unprioritized   Strep pharyngitis   Rapid strep is +. Will rx with amoxicillin  x 10 days. Pt is advised to call if symptoms worsen or if symptoms fail to improve.       Relevant Medications   amoxicillin  (AMOXIL ) 500 MG capsule   Other Visit Diagnoses       Sore throat    -  Primary   Relevant Orders   POCT rapid strep A (Completed)      Assessment & Plan     I have discontinued Jocelyn HERO. Tena's diphenhydrAMINE  and amitriptyline . I am also having her start on amoxicillin . Additionally, I am having her maintain her Misc Natural Products (JOINT HEALTH PO), acetaminophen , multivitamin with minerals, Calcium  Carb-Cholecalciferol  (CALCIUM  600 + D PO), cholecalciferol , meclizine , and pravastatin .  Meds ordered this encounter  Medications   amoxicillin  (AMOXIL ) 500 MG capsule    Sig: Take 1 capsule (500 mg total) by mouth 2 (two) times daily for 10 days.    Dispense:  20 capsule    Refill:  0    Supervising Provider:  BLYTH, STACEY A [[id number]]

## 2024-06-24 NOTE — Patient Instructions (Signed)
  VISIT SUMMARY: During your visit, we discussed your symptoms of ear fullness, neck pain, and throat irritation that you have been experiencing for the past week to ten days. You also mentioned experiencing end-of-day aches, chills, a low-grade fever, an 'off taste,' headaches, and tenderness in your neck area. We performed a strep test to determine if you have a bacterial infection.  YOUR PLAN: -STREPTOCOCCAL PHARYNGITIS AND ACUTE PHARYNGITIS: Streptococcal pharyngitis is a bacterial infection that causes a sore throat. We performed a strep test to check for this infection. If the test is positive, you will need to take amoxicillin  twice a day for ten days. Please discard your toothbrush and replace it after 24 hours if you start antibiotics. Inform your family to monitor for sore throat and seek testing if symptoms develop.  -HEADACHE: Your headache is likely related to your strep infection. Continue taking Aleve or Tylenol  as needed for headache relief.  INSTRUCTIONS: We will follow up with you after the strep test results are available. If the test is positive, start taking the prescribed antibiotics and follow the instructions provided. If your symptoms worsen or you develop new symptoms, please contact our office.

## 2024-08-14 ENCOUNTER — Encounter: Payer: Self-pay | Admitting: Family

## 2024-08-14 ENCOUNTER — Ambulatory Visit: Admitting: Family

## 2024-08-14 VITALS — BP 145/85 | HR 79 | Temp 98.6°F | Resp 16 | Ht 61.5 in | Wt 133.6 lb

## 2024-08-14 DIAGNOSIS — Z0000 Encounter for general adult medical examination without abnormal findings: Secondary | ICD-10-CM

## 2024-08-14 DIAGNOSIS — R61 Generalized hyperhidrosis: Secondary | ICD-10-CM | POA: Diagnosis not present

## 2024-08-14 DIAGNOSIS — Z0001 Encounter for general adult medical examination with abnormal findings: Secondary | ICD-10-CM | POA: Diagnosis not present

## 2024-08-14 DIAGNOSIS — R42 Dizziness and giddiness: Secondary | ICD-10-CM

## 2024-08-14 DIAGNOSIS — E7800 Pure hypercholesterolemia, unspecified: Secondary | ICD-10-CM

## 2024-08-14 DIAGNOSIS — E785 Hyperlipidemia, unspecified: Secondary | ICD-10-CM

## 2024-08-14 DIAGNOSIS — R030 Elevated blood-pressure reading, without diagnosis of hypertension: Secondary | ICD-10-CM | POA: Diagnosis not present

## 2024-08-14 DIAGNOSIS — Z1231 Encounter for screening mammogram for malignant neoplasm of breast: Secondary | ICD-10-CM

## 2024-08-14 LAB — CBC WITH DIFFERENTIAL/PLATELET
Basophils Absolute: 0 K/uL (ref 0.0–0.1)
Basophils Relative: 0.8 % (ref 0.0–3.0)
Eosinophils Absolute: 0.1 K/uL (ref 0.0–0.7)
Eosinophils Relative: 2.6 % (ref 0.0–5.0)
HCT: 41.1 % (ref 36.0–46.0)
Hemoglobin: 13.5 g/dL (ref 12.0–15.0)
Lymphocytes Relative: 25.6 % (ref 12.0–46.0)
Lymphs Abs: 1.3 K/uL (ref 0.7–4.0)
MCHC: 32.8 g/dL (ref 30.0–36.0)
MCV: 88.6 fl (ref 78.0–100.0)
Monocytes Absolute: 0.3 K/uL (ref 0.1–1.0)
Monocytes Relative: 5.2 % (ref 3.0–12.0)
Neutro Abs: 3.4 K/uL (ref 1.4–7.7)
Neutrophils Relative %: 65.8 % (ref 43.0–77.0)
Platelets: 369 K/uL (ref 150.0–400.0)
RBC: 4.63 Mil/uL (ref 3.87–5.11)
RDW: 16.3 % — ABNORMAL HIGH (ref 11.5–15.5)
WBC: 5.1 K/uL (ref 4.0–10.5)

## 2024-08-14 LAB — TSH: TSH: 49.65 u[IU]/mL — ABNORMAL HIGH (ref 0.35–5.50)

## 2024-08-14 LAB — COMPREHENSIVE METABOLIC PANEL WITH GFR
ALT: 15 U/L (ref 3–35)
AST: 21 U/L (ref 5–37)
Albumin: 4.9 g/dL (ref 3.5–5.2)
Alkaline Phosphatase: 50 U/L (ref 39–117)
BUN: 13 mg/dL (ref 6–23)
CO2: 28 meq/L (ref 19–32)
Calcium: 9.8 mg/dL (ref 8.4–10.5)
Chloride: 102 meq/L (ref 96–112)
Creatinine, Ser: 0.66 mg/dL (ref 0.40–1.20)
GFR: 86.3 mL/min
Glucose, Bld: 98 mg/dL (ref 70–99)
Potassium: 3.8 meq/L (ref 3.5–5.1)
Sodium: 139 meq/L (ref 135–145)
Total Bilirubin: 0.6 mg/dL (ref 0.2–1.2)
Total Protein: 7.3 g/dL (ref 6.0–8.3)

## 2024-08-14 LAB — LIPID PANEL
Cholesterol: 243 mg/dL — ABNORMAL HIGH
HDL: 64 mg/dL
LDL Cholesterol (Calc): 148 mg/dL — ABNORMAL HIGH
Non-HDL Cholesterol (Calc): 179 mg/dL — ABNORMAL HIGH
Total CHOL/HDL Ratio: 3.8 (calc)
Triglycerides: 179 mg/dL — ABNORMAL HIGH

## 2024-08-14 MED ORDER — LORATADINE 10 MG PO TABS: 5.0000 mg | ORAL_TABLET | Freq: Every day | ORAL | Status: AC

## 2024-08-14 NOTE — Patient Instructions (Addendum)
" °  VISIT SUMMARY: During your routine follow-up visit, we discussed your current health status and addressed several concerns. Your immunizations are up to date, and we have scheduled a mammogram for the end of February. We also discussed your history of vertigo, leg swelling, and elevated blood pressure.  YOUR PLAN: -ADULT WELLNESS VISIT: This visit was a routine check-up to monitor your overall health. Your immunizations are up to date, and we have scheduled a mammogram for the end of February. You do not need PAP smears after age 23. We have referred you to [facility] in [place] for a skin evaluation and ordered labs to check your cholesterol, kidney function, thyroid , and blood count. We will follow up in one month to recheck your blood pressure.  -HYPERLIPIDEMIA: Hyperlipidemia means you have high levels of cholesterol in your blood. We have ordered a cholesterol lab test to check your levels since they have not been checked in a year.  -ELEVATED BLOOD PRESSURE: Your blood pressure was elevated at 135/85 during the visit. We will recheck your blood pressure in one month before considering any medication.  -BENIGN PAROXYSMAL POSITIONAL VERTIGO: This condition causes brief episodes of dizziness when you change the position of your head. If your vertigo symptoms become more frequent or severe, we may consider physical therapy.  INSTRUCTIONS: Please complete the lab tests for cholesterol, kidney function, thyroid , and blood count as ordered. Attend your mammogram appointment at the end of February and your dermatology evaluation at [facility] in [place]. We will see you in one month for a follow-up appointment to recheck your blood pressure.    Contains text generated by [employer].   "

## 2024-08-14 NOTE — Assessment & Plan Note (Signed)
" °  Routine wellness visit with no significant changes. Immunizations up to date. Mammogram due end of February. No need for PAPs after age 74. Skin concerns noted, no family history of skin cancer. IBS previously addressed. History of benign paroxysmal positional vertigo. - Ordered mammogram for end of February. - Referred to [facility] in [place] for skin evaluation. - Ordered labs for cholesterol, kidney function, thyroid , and blood count. - Scheduled follow-up appointment in one month for blood pressure recheck.  "

## 2024-08-14 NOTE — Assessment & Plan Note (Signed)
 BP Readings from Last 3 Encounters:  08/14/24 (!) 145/85  06/24/24 128/61  01/16/24 127/67    Blood pressure elevated at 145/85. No current antihypertensive medication. Plan to recheck before considering medication. - Scheduled follow-up appointment in one month for blood pressure recheck.

## 2024-08-14 NOTE — Progress Notes (Signed)
 "  Subjective:     Patient ID: Jocelyn Morgan, female    DOB: 09-03-1950, 74 y.o.   MRN: [id number]  No chief complaint on file.   HPI  Discussed the use of AI scribe software for clinical note transcription with the patient, who gave verbal consent to proceed.  History of Present Illness Jocelyn Morgan is a 74 year old female who presents for a routine follow-up visit.  She takes half a loratadine  tablet every night to aid sleep, as it calms her mind if she wakes up during the night. She does not experience congestion or rhinorrhea.  She has a history of diarrhea every morning for about two hours, which began in July and was evaluated in September. Treatment with Ibogard led to improvement by December.  In December, she experienced a fall, followed by night sweats and a weight loss of seven pounds, which resolved after three to four weeks.  She mentions a single episode where her smartwatch recorded a pulse of 127 at a party, but she did not feel symptomatic. This occurred around the same time as her other symptoms.  She experiences vertigo when laying down, getting up quickly, or tipping her head back, such as when looking at the top shelf in a grocery store. She has not pursued physical therapy for this.  She reports mild leg swelling at the end of the day. No current cough, cold symptoms, unusual muscle or joint pain, frequent headaches, or concerns about depression or anxiety.  Her blood pressure was high during the visit, but she does not take any medication for it and has never been on antihypertensive medication.      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   Procedure: RIGHT TOTAL HIP ARTHROPLASTY ANTERIOR APPROACH;  Surgeon: Vernetta Lonni GRADE, MD;  Location: [facility];  Service: Orthopedics;  Laterality: Right;   TUBAL LIGATION      Family History  Problem Relation Age of Onset   Cancer Mother 23       breast   Allergic rhinitis Mother    Urticaria Mother    COPD Father        lived to be 27   Bladder Cancer Father    Ulcerative colitis Brother    CAD Brother    Prostate cancer Brother    Hypertension Maternal Grandmother    Macular degeneration Maternal Grandfather    Vitiligo Son    Macular degeneration Maternal Aunt    ALS Maternal Aunt    Breast cancer Cousin 69       Stage 3 breast cancer   Arthritis Other    Diabetes Other    Cancer Other        bladder    Social History   Socioeconomic History   Marital status: Married    Spouse name: Not on file   Number of children: 4   Years  of education: Not on file   Highest education level: Associate degree: occupational, scientist, product/process development, or vocational program  Occupational History   Occupation: housewife  Tobacco Use   Smoking status: Never   Smokeless tobacco: Never  Vaping Use   Vaping status: Never Used  Substance and Sexual Activity   Alcohol use: Yes    Alcohol/week: 1.0 - 2.0 standard drink of alcohol    Types: 1 - 2 Glasses of wine per week    Comment: occasional   Drug use: No   Sexual activity: Yes  Other Topics Concern   Not on file  Social History Narrative   Exercise: sometimes   Caffeine: 2 cups 1/2 and 1/2 coffee daily   6 grandchildren (none of her children are in [place])   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          Social Drivers of Health   Tobacco Use: Low Risk (08/14/2024)   Patient History    Smoking Tobacco Use: Never    Smokeless  Tobacco Use: Never    Passive Exposure: Not on file  Financial Resource Strain: Low Risk (06/22/2024)   Overall Financial Resource Strain (CARDIA)    Difficulty of Paying Living Expenses: Not hard at all  Food Insecurity: No Food Insecurity (06/22/2024)   Epic    Worried About Programme Researcher, Broadcasting/film/video in the Last Year: Never true    Ran Out of Food in the Last Year: Never true  Transportation Needs: No Transportation Needs (06/22/2024)   Epic    Lack of Transportation (Medical): No    Lack of Transportation (Non-Medical): No  Physical Activity: Insufficiently Active (06/22/2024)   Exercise Vital Sign    Days of Exercise per Week: 4 days    Minutes of Exercise per Session: 30 min  Stress: No Stress Concern Present (06/22/2024)   [employer] of Occupational Health - Occupational Stress Questionnaire    Feeling of Stress: Only a little  Social Connections: Socially Integrated (06/22/2024)   Social Connection and Isolation Panel    Frequency of Communication with Friends and Family: More than three times a week    Frequency of Social Gatherings with Friends and Family: Once a week    Attends Religious Services: More than 4 times per year    Active Member of [employer] or Organizations: Yes    Attends Banker Meetings: 1 to 4 times per year    Marital Status: Married  Catering Manager Violence: Not At Risk (10/05/2023)   Humiliation, Afraid, Rape, and Kick questionnaire    Fear of Current or Ex-Partner: No    Emotionally Abused: No    Physically Abused: No    Sexually Abused: No  Depression (PHQ2-9): Low Risk (10/05/2023)   Depression (PHQ2-9)    PHQ-2 Score: 0  Alcohol Screen: Low Risk (06/22/2024)   Alcohol Screen    Last Alcohol Screening Score (AUDIT): 2  Housing: Unknown (06/22/2024)   Epic    Unable to Pay for Housing in the Last Year: No    Number of Times Moved in the Last Year: Not on file    Homeless in the Last Year: No  Utilities: Not At Risk (10/05/2023)    [employer] Utilities    Threatened with loss of utilities: No  Health Literacy: Adequate Health Literacy (10/05/2023)   B1300 Health Literacy    Frequency of need for help with medical instructions: Never    Outpatient Medications Prior to Visit  Medication Sig Dispense Refill   acetaminophen  (  TYLENOL ) 650 MG CR tablet Take 650 mg by mouth every 8 (eight) hours as needed for pain.     Calcium  Carb-Cholecalciferol  (CALCIUM  600 + D PO) Take 1 tablet by mouth in the morning and at bedtime.     cholecalciferol  (VITAMIN D3) 25 MCG (1000 UNIT) tablet Take 1,000 Units by mouth daily.     meclizine  (ANTIVERT ) 25 MG tablet Take 1 tablet (25 mg total) by mouth 3 (three) times daily as needed for dizziness. 30 tablet 0   Misc Natural Products (JOINT HEALTH PO) Take 1 tablet by mouth daily.     Multiple Vitamin (MULTIVITAMIN WITH MINERALS) TABS tablet Take 1 tablet by mouth daily.     pravastatin  (PRAVACHOL ) 20 MG tablet Take 1 tablet (20 mg total) by mouth daily. 90 tablet 1   No facility-administered medications prior to visit.    Allergies[1]  Review of Systems  HENT:  Positive for hearing loss (wears hearing aids). Negative for congestion.   Eyes:  Negative for blurred vision.  Respiratory:  Negative for cough.   Cardiovascular:  Positive for leg swelling (mild at the end of the day).  Gastrointestinal:  Positive for diarrhea (some days- resolved by December).  Genitourinary:  Negative for dysuria and frequency.  Musculoskeletal:  Positive for joint pain (right hand arthritis). Negative for myalgias.  Skin:  Negative for rash.  Neurological:  Negative for headaches.  Psychiatric/Behavioral:  Negative for depression. The patient is not nervous/anxious.        Objective:    Physical Exam   BP (!) 145/85   Pulse 79   Temp 98.6 F (37 C) (Oral)   Resp 16   Ht 5' 1.5 (1.562 m)   Wt 133 lb 9.6 oz (60.6 kg)   LMP 07/25/1998   SpO2 98%   BMI 24.83 kg/m  Wt Readings from Last 3  Encounters:  08/14/24 133 lb 9.6 oz (60.6 kg)  06/24/24 134 lb (60.8 kg)  01/16/24 137 lb (62.1 kg)   BP Readings from Last 3 Encounters:  08/14/24 (!) 145/85  06/24/24 128/61  01/16/24 127/67   Physical Exam  Constitutional: She is oriented to person, place, and time. She appears well-developed and well-nourished. No distress.  HENT:  Head: Normocephalic and atraumatic.  Right Ear: Tympanic membrane and ear canal normal.  Left Ear: Tympanic membrane and ear canal normal.  Mouth/Throat: Oropharynx is clear and moist.  Eyes: Pupils are equal, round, and reactive to light. No scleral icterus.  Neck: Normal range of motion. No thyromegaly present.  Cardiovascular: Normal rate and regular rhythm.   No murmur heard. Pulmonary/Chest: Effort normal and breath sounds normal. No respiratory distress. He has no wheezes. She has no rales. She exhibits no tenderness.  Abdominal: Soft. Bowel sounds are normal. She exhibits no distension and no mass. There is no tenderness. There is no rebound and no guarding.  Musculoskeletal: She exhibits no edema.  Lymphadenopathy:    She has no cervical adenopathy.  Neurological: She is alert and oriented to person, place, and time. She has normal patellar reflexes. She exhibits normal muscle tone. Coordination normal.  Skin: Skin is warm and dry.  Psychiatric: She has a normal mood and affect. Her behavior is normal. Judgment and thought content normal.  Breast/Pelvic: deferred           Assessment & Plan:        Assessment & Plan:   Problem List Items Addressed This Visit       Unprioritized  Vertigo   Mild, avoids certain position changes that aggravate. Offered vestibular PT, declines at this tim.       Preventative health care    Routine wellness visit with no significant changes. Immunizations up to date. Mammogram due end of February. No need for PAPs after age 40. Skin concerns noted, no family history of skin cancer. IBS  previously addressed. History of benign paroxysmal positional vertigo. - Ordered mammogram for end of February. - Referred to [facility] in [place] for skin evaluation. - Ordered labs for cholesterol, kidney function, thyroid , and blood count. - Scheduled follow-up appointment in one month for blood pressure recheck.       Relevant Orders   Ambulatory referral to Dermatology   Night sweats   Resolved, could have had virus following strep infection.  Check CBC, TSH.       Relevant Orders   CBC w/Diff   TSH   Hyperlipidemia    Update lipid panel. Continue pravastatin .        Relevant Orders   Comp Met (CMET)   Lipid panel   Elevated blood pressure reading   BP Readings from Last 3 Encounters:  08/14/24 (!) 145/85  06/24/24 128/61  01/16/24 127/67    Blood pressure elevated at 145/85. No current antihypertensive medication. Plan to recheck before considering medication. - Scheduled follow-up appointment in one month for blood pressure recheck.      Other Visit Diagnoses       Breast cancer screening by mammogram    -  Primary   Relevant Orders   MM 3D SCREENING MAMMOGRAM BILATERAL BREAST       Assessment & Plan   Assessment & Plan    I am having Jocelyn Morgan start on loratadine . I am also having her maintain her Misc Natural Products (JOINT HEALTH PO), acetaminophen , multivitamin with minerals, Calcium  Carb-Cholecalciferol  (CALCIUM  600 + D PO), cholecalciferol , meclizine , and pravastatin .  Meds ordered this encounter  Medications   loratadine  (CLARITIN ) 10 MG tablet    Sig: Take 0.5 tablets (5 mg total) by mouth daily.    Supervising Provider:   DOMENICA BLACKBIRD A [[id number]]      [1]  Allergies Allergen Reactions   Demerol  [Meperidine  Hcl] Nausea And Vomiting    In combination with versed caused severe n/v and headaches    Versed [Midazolam] Nausea And Vomiting    In combination with demerol  caused severe n/v and headaches    "

## 2024-08-14 NOTE — Assessment & Plan Note (Signed)
 Mild, avoids certain position changes that aggravate. Offered vestibular PT, declines at this tim.

## 2024-08-14 NOTE — Assessment & Plan Note (Signed)
 Resolved, could have had virus following strep infection.  Check CBC, TSH.

## 2024-08-14 NOTE — Assessment & Plan Note (Addendum)
Update lipid panel.  Continue pravastatin.

## 2024-08-14 NOTE — Assessment & Plan Note (Signed)
Update lipid panel.  Continue pravastatin.

## 2024-08-15 ENCOUNTER — Ambulatory Visit: Payer: Self-pay

## 2024-08-15 ENCOUNTER — Other Ambulatory Visit

## 2024-08-15 ENCOUNTER — Other Ambulatory Visit: Payer: Self-pay

## 2024-08-15 DIAGNOSIS — E039 Hypothyroidism, unspecified: Secondary | ICD-10-CM | POA: Diagnosis not present

## 2024-08-15 LAB — T3, FREE: T3, Free: 2.8 pg/mL (ref 2.3–4.2)

## 2024-08-15 LAB — T4, FREE: Free T4: 0.46 ng/dL — ABNORMAL LOW (ref 0.60–1.60)

## 2024-08-16 ENCOUNTER — Ambulatory Visit: Payer: Self-pay | Admitting: Family

## 2024-08-16 DIAGNOSIS — E039 Hypothyroidism, unspecified: Secondary | ICD-10-CM

## 2024-08-16 MED ORDER — LEVOTHYROXINE SODIUM 25 MCG PO TABS: 25.0000 ug | ORAL_TABLET | Freq: Every day | ORAL | 0 refills | Status: AC

## 2024-08-16 NOTE — Telephone Encounter (Signed)
 Please advise pt that her lab work shows underactive thyroid . I would like her to start synthroid once daily in AM. Take on empty stomach 30 minutes before food/other meds. Repeat tsh in 6 weeks.

## 2024-09-13 ENCOUNTER — Ambulatory Visit: Admitting: Family

## 2024-10-10 ENCOUNTER — Ambulatory Visit
